# Patient Record
Sex: Male | Born: 1960 | Race: White | Hispanic: No | Marital: Single | Smoking: Current every day smoker
Health system: Southern US, Community
[De-identification: ages and names within clinical notes are randomized; demographics above are authoritative.]

## PROBLEM LIST (undated history)

## (undated) DIAGNOSIS — E119 Type 2 diabetes mellitus without complications: Secondary | ICD-10-CM

## (undated) DIAGNOSIS — I639 Cerebral infarction, unspecified: Secondary | ICD-10-CM

## (undated) DIAGNOSIS — I10 Essential (primary) hypertension: Secondary | ICD-10-CM

## (undated) DIAGNOSIS — IMO0002 Reserved for concepts with insufficient information to code with codable children: Secondary | ICD-10-CM

## (undated) DIAGNOSIS — C801 Malignant (primary) neoplasm, unspecified: Secondary | ICD-10-CM

## (undated) DIAGNOSIS — S2239XA Fracture of one rib, unspecified side, initial encounter for closed fracture: Secondary | ICD-10-CM

## (undated) HISTORY — PX: NO PAST SURGERIES: SHX2092

## (undated) HISTORY — DX: Fracture of one rib, unspecified side, initial encounter for closed fracture: S22.39XA

## (undated) HISTORY — DX: Cerebral infarction, unspecified: I63.9

## (undated) HISTORY — DX: Reserved for concepts with insufficient information to code with codable children: IMO0002

---

## 2004-08-11 ENCOUNTER — Emergency Department (HOSPITAL_COMMUNITY): Admission: EM | Admit: 2004-08-11 | Discharge: 2004-08-11 | Payer: Self-pay

## 2007-07-30 ENCOUNTER — Ambulatory Visit: Payer: Self-pay | Admitting: Family Medicine

## 2007-07-30 ENCOUNTER — Ambulatory Visit: Payer: Self-pay | Admitting: Internal Medicine

## 2007-07-30 ENCOUNTER — Inpatient Hospital Stay (HOSPITAL_COMMUNITY): Admission: AD | Admit: 2007-07-30 | Discharge: 2007-07-31 | Payer: Self-pay | Admitting: Family Medicine

## 2007-07-31 ENCOUNTER — Encounter: Payer: Self-pay | Admitting: Family Medicine

## 2007-07-31 ENCOUNTER — Ambulatory Visit: Payer: Self-pay | Admitting: Vascular Surgery

## 2007-11-02 ENCOUNTER — Encounter: Admission: RE | Admit: 2007-11-02 | Discharge: 2008-01-31 | Payer: Self-pay | Admitting: Family Medicine

## 2008-12-25 ENCOUNTER — Inpatient Hospital Stay (HOSPITAL_COMMUNITY): Admission: EM | Admit: 2008-12-25 | Discharge: 2008-12-27 | Payer: Self-pay | Admitting: Emergency Medicine

## 2008-12-25 ENCOUNTER — Ambulatory Visit: Payer: Self-pay | Admitting: Family Medicine

## 2010-11-27 LAB — CBC
HCT: 27.6 % — ABNORMAL LOW (ref 39.0–52.0)
Hemoglobin: 7.8 g/dL — CL (ref 13.0–17.0)
Hemoglobin: 9 g/dL — ABNORMAL LOW (ref 13.0–17.0)
Hemoglobin: 9.5 g/dL — ABNORMAL LOW (ref 13.0–17.0)
MCHC: 34.4 g/dL (ref 30.0–36.0)
MCHC: 35.1 g/dL (ref 30.0–36.0)
MCHC: 35.7 g/dL (ref 30.0–36.0)
MCV: 88 fL (ref 78.0–100.0)
MCV: 88.6 fL (ref 78.0–100.0)
Platelets: 264 10*3/uL (ref 150–400)
Platelets: 301 10*3/uL (ref 150–400)
RBC: 2.5 MIL/uL — ABNORMAL LOW (ref 4.22–5.81)
RBC: 3.02 MIL/uL — ABNORMAL LOW (ref 4.22–5.81)
RDW: 13.6 % (ref 11.5–15.5)
RDW: 13.7 % (ref 11.5–15.5)
RDW: 13.7 % (ref 11.5–15.5)
WBC: 9.3 10*3/uL (ref 4.0–10.5)
WBC: 9.3 10*3/uL (ref 4.0–10.5)

## 2010-11-27 LAB — BASIC METABOLIC PANEL
BUN: 19 mg/dL (ref 6–23)
CO2: 21 mEq/L (ref 19–32)
CO2: 23 mEq/L (ref 19–32)
CO2: 23 mEq/L (ref 19–32)
Calcium: 8.4 mg/dL (ref 8.4–10.5)
Chloride: 107 mEq/L (ref 96–112)
Chloride: 110 mEq/L (ref 96–112)
Creatinine, Ser: 0.99 mg/dL (ref 0.4–1.5)
Creatinine, Ser: 1.25 mg/dL (ref 0.4–1.5)
Creatinine, Ser: 1.38 mg/dL (ref 0.4–1.5)
GFR calc Af Amer: >60 mL/min (ref >60)
GFR calc Af Amer: >60 mL/min (ref >60)
GFR calc non Af Amer: 55 mL/min — ABNORMAL LOW (ref >60)
GFR calc non Af Amer: >60 mL/min (ref >60)
GFR calc non Af Amer: >60 mL/min (ref >60)
GFR calc non Af Amer: >60 mL/min (ref >60)
GFR calc non Af Amer: >60 mL/min (ref >60)
Glucose, Bld: 182 mg/dL — ABNORMAL HIGH (ref 70–99)
Glucose, Bld: 291 mg/dL — ABNORMAL HIGH (ref 70–99)
Potassium: 4.2 mEq/L (ref 3.5–5.1)
Potassium: 5.8 mEq/L — ABNORMAL HIGH (ref 3.5–5.1)
Sodium: 135 mEq/L (ref 135–145)
Sodium: 136 mEq/L (ref 135–145)
Sodium: 136 mEq/L (ref 135–145)
Sodium: 137 mEq/L (ref 135–145)

## 2010-11-27 LAB — GLUCOSE, CAPILLARY
Glucose-Capillary: 181 mg/dL — ABNORMAL HIGH (ref 70–99)
Glucose-Capillary: 215 mg/dL — ABNORMAL HIGH (ref 70–99)
Glucose-Capillary: 218 mg/dL — ABNORMAL HIGH (ref 70–99)
Glucose-Capillary: 277 mg/dL — ABNORMAL HIGH (ref 70–99)
Glucose-Capillary: 368 mg/dL — ABNORMAL HIGH (ref 70–99)

## 2010-11-27 LAB — DIFFERENTIAL
Basophils Absolute: 0 10*3/uL (ref 0.0–0.1)
Basophils Relative: 0 % (ref 0–1)
Eosinophils Relative: 1 % (ref 0–5)
Lymphocytes Relative: 16 % (ref 12–46)
Lymphs Abs: 2 10*3/uL (ref 0.7–4.0)
Monocytes Absolute: 0.6 10*3/uL (ref 0.1–1.0)
Neutrophils Relative %: 79 % — ABNORMAL HIGH (ref 43–77)

## 2010-11-27 LAB — COMPREHENSIVE METABOLIC PANEL
ALT: 21 U/L (ref 0–53)
Albumin: 3.8 g/dL (ref 3.5–5.2)
Alkaline Phosphatase: 51 U/L (ref 39–117)
BUN: 79 mg/dL — ABNORMAL HIGH (ref 6–23)
CO2: 19 mEq/L (ref 19–32)
Calcium: 9.2 mg/dL (ref 8.4–10.5)
GFR calc Af Amer: >60 mL/min (ref >60)
GFR calc non Af Amer: 50 mL/min — ABNORMAL LOW (ref >60)
Potassium: 7.1 mEq/L (ref 3.5–5.1)
Sodium: 134 mEq/L — ABNORMAL LOW (ref 135–145)

## 2010-11-27 LAB — LIPASE, BLOOD: Lipase: 50 U/L (ref 11–59)

## 2010-11-27 LAB — HEMOCCULT GUIAC POC 1CARD (OFFICE): Fecal Occult Bld: POSITIVE

## 2010-11-27 LAB — CROSSMATCH: Antibody Screen: NEGATIVE

## 2011-01-01 NOTE — H&P (Signed)
NAME:  Dylan Jacobs, Dylan Jacobs               ACCOUNT NO.:  [id number]   MEDICAL RECORD NO.:  [id number]          PATIENT TYPE:  INP   LOCATION:  4735                         FACILITY:  [facility]   PHYSICIAN:  Paula Compton, MD        DATE OF BIRTH:  1961-05-22   DATE OF ADMISSION:  12/25/2008  DATE OF DISCHARGE:                              HISTORY & PHYSICAL   PRIMARY CARE Jamyla Ard:  Dr. Alwyn Ren of [facility].   CHIEF COMPLAINT:  Hyperkalemia and melena.   HISTORY OF PRESENT ILLNESS:  The patient is a 50 year old male with a  history of hypertension and diabetes who presents with after he was  found to have a potassium of 6.6 in his primary care Eliska Hamil's office.  The patient has been having epigastric abdominal pain for 3 days with  melena.  Denies nausea, vomiting, hematochezia, hematemesis, and coffee-  ground emesis.  The patient is to see Dr. Arlyce Dice of [facility] this  upcoming week for evaluation.  The patient's pain improved with Nexium.  Denies chest pain, shortness of breath, palpitations, syncope,  lightheadedness.  Denies NSAID use except for aspirin 81 mg p.o. daily.  Denies heavy alcohol use except for 3-4 glasses of wine on the weekends.   PAST MEDICAL HISTORY:  1. Hypertension.  2. Diabetes.  3. History of TIA 2 to 3 years ago.  4. History of lacunar infarct, left thalamus.   PAST SURGICAL HISTORY:  None.   ALLERGIES:  No known drug allergies.   MEDICATIONS:  1. Lisinopril/hydrochlorothiazide 20/25 mg daily.  2. Metformin 1000 mg p.o. b.i.d.  3. Lopressor 25 mg p.o. b.i.d.  4. Aspirin 81 mg p.o. daily.  5. Glipizide 25 mg p.o. daily.  6. Lipitor 60 mg p.o. daily.   FAMILY HISTORY:  Mother with hypertension.   SOCIAL HISTORY:  The patient lives with 2 brothers and with his parents.  Remote tobacco abuse, quit 3 years ago with an occasional alcohol and no  drugs.   REVIEW OF SYSTEMS:  As per HPI, otherwise, complete review of systems is  negative.   PHYSICAL EXAMINATION:  VITAL SIGNS:  Temperature 97.5, heart rate 103,  blood pressure 112/64, respiratory rate 18, and oxygen saturation 100%.  GENERAL:  No apparent distress, obese male.  HEENT:  Conjunctivae pallor.  No scleral icterus.  Extraocular movements  intact.  Pupils equal, round, reactive to light and accommodation.  CARDIOVASCULAR:  Tachycardic.  No murmurs, rubs, or gallops.  Regular  rhythm.  RESPIRATORY:  Clear to auscultation bilaterally with normal work of  breathing.  ABDOMEN:  Soft, nontender, and nondistended.  Positive bowel sounds.  EXTREMITIES:  Warm and well perfused without edema, clubbing, or  cyanosis.  SKIN:  Positive for pallor.  No jaundice.  NEUROLOGIC:  Nonfocal.  Cranial nerves II through XII grossly intact.   LABORATORY DATA:  Fecal occult blood positive.  Sodium 134, potassium is  10.1, glucose 259, chloride 110, bicarb 19, BUN 79, creatinine 1.49,  bili 0.7, alk phos 51, AST 15, ALT 21, total protein 6.5, albumin 3.8,  and calcium 9.2.  PTT 49, INR 1.0, and PT 13.2.  MCV 89.2, hemoglobin 9,  hematocrit 32.1, white blood cell count 13, platelets 314, and lipase  50.   EKG with peak T waves.   ASSESSMENT AND PLAN:  1. Hyperkalemia:  The patient is asymptomatic except for EKG changes,      possibly secondary to ACE inhibitor, GI bleed, and worsen renal      function.  The patient's creatinine is normal, however, was      elevated from 1.04 on November 21, 2008.  The patient's BUN is also      elevated, which would be consistent with hemolysis.  The patient      was given calcium gluconate, insulin, D50, sodium bicarbonate in      the emergency department secondary to peak T waves in the EKG.  We      will also give the patient Kayexalate x1 and repeat BMET at 1600      today and later this evening as well.  We will repeat Kayexalate as      needed.  We will place the patient on telemetry.  2. Gastrointestinal bleed:  The patient is typed and crossed 1  unit.      He has melena, Hemoccult positive.  He will see Dr. Arlyce Dice this      week.  We will consult [facility].  We will make the patient n.p.o.      after midnight.  We will prescribe Protonix 40 mg IV b.i.d.  Avoid      NSAIDs and heparin.  3. Hypertension:  The patient's BP is stable.  We will not continue      ACE inhibitor or diuretic given acute renal failure and elevated      potassium.  4. Diabetes mellitus:  Sliding scale insulin, continue glipizide.  We      will hold off on metformin for now.  5. Hyperlipidemia:  Continue Lipitor.  6. Fluids, electrolytes, nutrition/gastrointestinal:  Carb-modified      diet, IV fluids.  We will make the patient n.p.o. after midnight.      We will adjust elevated potassium as above.  7. Acute renal failure, likely secondary to ACE inhibitor:  We will      monitor and hydrate.  8. Prophylaxis:  Protonix and SCDs.  9. Disposition:  Pending decreased potassium and GI workup.      Lauro Franklin, MD  Electronically Signed      Paula Compton, MD  Electronically Signed   TCB/MEDQ  D:  12/25/2008  T:  12/26/2008  Job:  [id number]

## 2011-01-01 NOTE — Discharge Summary (Signed)
NAME:  Dylan Jacobs, Dylan Jacobs               ACCOUNT NO.:  [id number]   MEDICAL RECORD NO.:  [id number]          PATIENT TYPE:  INP   LOCATION:  4735                         FACILITY:  [facility]   PHYSICIAN:  Pearlean Brownie, M.D.DATE OF BIRTH:  02-18-1961   DATE OF ADMISSION:  12/25/2008  DATE OF DISCHARGE:  12/27/2008                               DISCHARGE SUMMARY   PRIMARY CARE Tymeshia Awan:  Dr. Alwyn Ren at [facility].   DISCHARGE DIAGNOSES:  1. Hyperkalemia.  2. Duodenal ulcers.   ADDITIONAL DIAGNOSES:  1. Hypertension.  2. Diabetes.  3. History of transient ischemic attack.  4. History of lacunar infarct.   DISCHARGE MEDICATIONS:  1. Nexium 20 mg p.o. b.i.d.  2. He may take his previously prescribed metformin 1000 mg p.o. b.i.d.  3. Lopressor 25 mg p.o. b.i.d.  4. Glipizide 25 mg p.o. daily.  5. Lipitor 60 mg p.o. daily.   The patient is to stop taking lisinopril, hydrochlorothiazide and  aspirin.   REASON FOR HOSPITALIZATION:  The patient is a 50 year old male with  diabetes and hypertension who was sent to the emergency department after  being found to have a potassium of 6.6 with EKG changes at his PCPs  office at [facility].  The patient had been having some epigastric abdominal  pain and melena.   HOSPITAL COURSE BY PROBLEM:  1. Hyperkalemia.  The patient was asymptomatic except for having some      peak T-waves on his EKG.  Our thoughts were that this was probably      a combination of factors including his GI bleed making him a little      bit hypovolemic and some hemolysis from the GI bleed in the setting      of taking an ACE inhibitor.  His creatinine was within normal      limits according to lab values but it is elevated since his      previous creatinine at 1.04.  When we saw him it was 1.49, which      technically is within normal limits but that is a considerable      increase from his baseline of 1.04.  He also had a BUN that was      elevated as well at 79.   At the time of discharge his creatinine is      back to baseline at 0.99 and his BUN is normal as well at 19.  For      his hyperkalemia, the patient was given calcium gluconate, insulin,      D50 and sodium bicarb in the emergency department.  He was also      given p.o. Kayexalate.  He was placed on telemetry.  His potassium      did normalize.  At the time of discharge it is 4.2.  He does need a      followup BMET at the end of this week, and this has been arranged      at [facility].  2. GI bleed.  The patient was typed and crossed for 1 unit and  transfused.  His initial hemoglobin was 9.0, and at the time of      discharge his hemoglobin is 9.9.  The patient was seen by Dr. Herbert Moors of [facility].  An EGD was done that showed two duodenal bulb      ulcers.  Biopsies were taken and for CLOtest.  Per Dr. Luan Moore      verbal report, those ulcers were not bleeding.  There was no active      bleeding and he did not do any treatment for those ulcers.  Dr.      Evette Cristal states that the patient does not need to follow up with him      necessarily, but our team will follow up with his CLOtest results      and forward those on to [facility] when those are available.  The      patient was sent home on Nexium.  Although it was not on his      initial med list, he reports he takes that at home.  3. Acute renal failure.  As mentioned previously, the patient's      creatinine was elevated upon admission.  At the time of discharge      it is back to its baseline.  We stopped his      lisinopril/hydrochlorothiazide combo pill as we thought that      probably contributed both to his renal failure and to his      hyperkalemia.  He was maintained on only his Lopressor.  We will      let P.A.-C. Leotis Shames and Dr. Alwyn Ren decide the risks of putting him      back on an ACE inhibitor.  4. Hypertension.  As mentioned previously, we maintained the patient      only on his beta-blocker during his  hospitalization.  His blood      pressures were generally in the 120s and 130s systolic only on the      metoprolol.  5. Diabetes.  He was placed on sliding scale insulin and his glipizide      was continued but we did not continue his metformin while in the      hospital.  Now that he is being discharged we will put back on his      home regimen with no changes.  6. Hyperlipidemia.  We continued his Lipitor.   FOLLOW UP:  The patient is to follow up with P.A.-C. Leotis Shames at [facility] on Friday, May 14th at 10:45 a.m.  He is to come earlier for  labs.  He is to get a BMET and a CBC particularly to follow up his renal  function, his potassium and his hemoglobin.  Other followup issues  include management of his blood pressure with [place] having held lisinopril,  hydrochlorothiazide, monitoring his potassium and deciding on blood  pressure medicines in light of his recent episode of hyperkalemia.  Other followup issues include following up on his CLOtest from his  biopsy, maintenance of his ulcer disease with a PPI as appropriate.      Asher Muir, MD  Electronically Signed      Pearlean Brownie, M.D.  Electronically Signed    SO/MEDQ  D:  12/27/2008  T:  12/27/2008  Job:  [id number]   cc:   Porfirio Oar, P.A.-C.  Peyton Najjar, MD

## 2011-01-01 NOTE — Consult Note (Signed)
NAME:  Dylan Jacobs, Dylan Jacobs               ACCOUNT NO.:  [id number]   MEDICAL RECORD NO.:  [id number]          PATIENT TYPE:  INP   LOCATION:  4735                         FACILITY:  [facility]   PHYSICIAN:  Graylin Shiver, M.D.   DATE OF BIRTH:  1961-07-10   DATE OF CONSULTATION:  12/26/2008  DATE OF DISCHARGE:                                 CONSULTATION   REASON FOR CONSULTATION:  The patient is a 50 year old male who 4 days  ago started to experience melena.  He saw his primary doctor who checked  some blood work, then sent him to the hospital for admission.  The  patient did not vomit any blood.  He states he did have some epigastric  discomfort a couple of days ago.  He states that today, his stools are  back to normal.  There are brown color.  When he came in to the  hospital, his stool was occult positive for blood.  His initial  hemoglobin yesterday was 9 at 3:30 a.m.  Today, his hemoglobin had  dropped to 7.8.  He has been transfused a unit of blood.  He feels fine  at this time.   PAST MEDICAL HISTORY:  1. Hypertension.  2. Diabetes.  3. History of TIA.  4. History of lacunar infarct.   MEDICATIONS:  Lisinopril/hydrochlorothiazide, metformin, Lopressor,  aspirin, glyburide, Lipitor.   SOCIAL HISTORY:  Drinks occasional wine, does not smoke.   PHYSICAL EXAMINATION:  GENERAL:  He is alert and oriented.  He is in no  distress, nonicteric.  HEART:  Regular rhythm.  No murmurs.  LUNGS:  Clear.  ABDOMEN:  Soft, nontender.  No hepatosplenomegaly.   IMPRESSION:  1. Melena.  2. Anemia secondary to blood loss.   PLAN:  I will schedule the patient for an EGD tomorrow to further  evaluate.  He will remain on proton pump inhibitors.  Blood will be  available and transfused as necessary.           ______________________________  Graylin Shiver, M.D.     SFG/MEDQ  D:  12/26/2008  T:  12/27/2008  Job:  [id number]   cc:   Paula Compton, MD

## 2011-01-01 NOTE — Op Note (Signed)
NAME:  Dylan Jacobs, [name]               ACCOUNT NO.:  [id number]   MEDICAL RECORD NO.:  [id number]          PATIENT TYPE:  INP   LOCATION:                               FACILITY:  [facility]   PHYSICIAN:  Graylin Shiver, M.D.   DATE OF BIRTH:  Oct 03, 1960   DATE OF PROCEDURE:  12/27/2008  DATE OF DISCHARGE:                               OPERATIVE REPORT   Esophagogastroduodenoscopy with biopsy for CLO test.   INDICATIONS FOR PROCEDURE:  Melena, anemia.   Informed consent was obtained after explanation of the risks of  bleeding, infection, and perforation.   PREMEDICATIONS:  1. Fentanyl 100 mcg IV.  2. Versed 10 mg IV.  3. Benadryl 25 mg IV.   PROCEDURE:  With the patient in the left lateral decubitus position, the  Pentax gastroscope was inserted into the oropharynx and passed into the  esophagus.  It was advanced down the esophagus, then into the stomach  and into the duodenum.  The second portion of the duodenum looked  normal.  In the bulb of the duodenum, there were 2 duodenal bulb ulcers,  one was approximately 7 mm in size, the other 1 cm in size.  There was  no evidence of a visible vessel or active bleeding.  The stomach looked  normal.  A biopsy for CLO test was obtained.  No lesions were seen in  the fundus or cardia of the stomach.  The esophagus looked normal.  He  tolerated the procedure well without complications.   IMPRESSION:  Two duodenal bulb ulcers.   PLAN:  We would recommend treatment with proton pump inhibitors and  check CLO test for evidence of Helicobacter pylori.           ______________________________  Graylin Shiver, M.D.     SFG/MEDQ  D:  12/27/2008  T:  12/28/2008  Job:  [id number]   cc:   Paula Compton, MD

## 2011-01-01 NOTE — H&P (Signed)
NAME:  Jacobs, Dylan               ACCOUNT NO.:  [id number]   MEDICAL RECORD NO.:  [id number]          PATIENT TYPE:  INP   LOCATION:  3731                         FACILITY:  [facility]   PHYSICIAN:  Angeline Slim, M.D.  DATE OF BIRTH:  1961-02-20   DATE OF ADMISSION:  07/30/2007  DATE OF DISCHARGE:                              HISTORY & PHYSICAL   PRIMARY CARE PHYSICIAN:  [place].   ADMISSION DIAGNOSES:  1. Possible transient ischemic attack.  2. Hypertension.   HISTORY OF PRESENT ILLNESS:  A 51 year old smoker presents after having  experienced 3 episodes of right facial numbness including mouth, right  arm and right leg which lasted approximately 5 minutes and were  separated by approximately 10 minutes. No episodes for the last few  hours. Denies changes in vision, chest pain, shortness of breath or  nausea. No previous episodes in the past. He denies dysphagia,  dysarthria, confusion or headache. The patient reports he had a cold  about 2 weeks ago followed by sinus pressure for the last 4 days which  he has been using over-the-counter Sudafed and nasal saline for. He has  been told in the past that he has borderline hypertension and  borderline sugar but he has not been on any medications and he does not  have a regular doctor.   PAST MEDICAL HISTORY:  Elevated blood pressure first told when he was  age 50. He also broke his arm when he was age 42. No other significant  medical history.   PAST SURGICAL HISTORY:  None.   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 He smokes 1-1/2 packs of cigarettes per day for the last 25  years. He denies alcohol or drug use. He lives with his parents and his  brother.   FAMILY HISTORY:  Significant for hypertension in his mother. His father  is in his 6s and is alive and well without any cardiac or hypertensive  history. He has no known drug allergies. He is not using any medicines  other than  over-the-counter Sudafed and nasal saline.   REVIEW OF SYSTEMS:  Denies recent malaise, fatigue, weight change.  Denies trauma, nausea, vomiting, visual changes. He does report sinus  pressure for the last 4 days. He denies shortness of breath, cough,  hemoptysis. He denies chest pain, paroxysmal nocturnal dyspnea and  dyspnea on exertion. He denies constipation, diarrhea and anorexia.  NEURO:  As per HPI. PSYCHE:  He denies depression and anxiety.   PHYSICAL EXAMINATION:  VITAL SIGNS:  Blood pressure 187/98, pulse 105,  pulse ox 96% on room air. Respiratory rate 16.  SKIN:  Reveals no rash.  HEENT:  Pupils equal round and reactive to light. Extraocular movements  intact. No sinus tenderness to palpation. Nasal turbinates are nonboggy,  nonedematous. Mouth is moist with 2 missing front teeth. No other  lesions.  HEART:  The patient has positive S1, S2. She is tachycardic at 105.  Possible S4 noted on exam. No radiation, no carotid bruits, no jugular  venous distention.  LUNGS:  Clear to auscultation  bilaterally without rales, rhonchi or  wheezing.  ABDOMEN:  Soft, nontender, nondistended, positive bowel sounds x4. No  hepatosplenomegaly.  MUSCULOSKELETAL:  No atrophy noted, no weakness. Full range of motion in  all joints.  NEUROLOGIC:  Cranial nerves II-XII intact. Sensation intact throughout.  Romberg exam normal. Rapid alternating hand movements normal. Gait  normal. Reflexes, 2+ patella reflex, 2+ brachial radialis.   ASSESSMENT/PLAN:  A 50 year old smoker with possible transient ischemic  attack. Stop Sudafed. EKG and telemetry now. Start aspirin 81 mg once a  day. Stat head CT. Consider MRI/MRA in the future. Check a 2-D echo.  Check carotid Dopplers. Check a fasting lipid panel and blood glucose  level. Also check urinary drug screen and cardiac enzymes. Plan to  control his blood pressure slowly. Should consider as an outpatient  possibly hydrochlorothiazide along with an  ACE inhibitor since they have  been shown to decrease risk for stroke. Will also start on an  antihypertensive if his systolic goes above 200 and his diastolic goes  above 120. Otherwise will continue to monitor patient.     ______________________________  Zannie Cove, MD    ______________________________  Angeline Slim, M.D.    IN/MEDQ  D:  07/30/2007  T:  07/31/2007  Job:  [id number]

## 2011-01-04 NOTE — Discharge Summary (Signed)
NAME:  Dylan Jacobs, Dylan Jacobs               ACCOUNT NO.:  [id number]   MEDICAL RECORD NO.:  [id number]          PATIENT TYPE:  INP   LOCATION:  3731                         FACILITY:  [facility]   PHYSICIAN:  Leighton Roach McDiarmid, M.D.DATE OF BIRTH:  [birth year]   DATE OF ADMISSION:  07/30/2007  DATE OF DISCHARGE:  07/31/2007                               DISCHARGE SUMMARY   DISCHARGE DIAGNOSES:  1. Left thalamus lacunar infarct.  2. Hypertension.  3. Hyperlipidemia.  4. Diabetes mellitus type 2.  5. Tobacco abuse.   DISCHARGE MEDICATIONS:  1. Aspirin 81 mg p.o. daily.  2. Lipitor 80 mg p.o. every night.  3. Metformin 500 mg p.o. b.i.d.  4. Glipizide 2.5 mg 1 p.o. daily.   PROCEDURES:  1. Noncontrast head CT was positive for a left thalamus lacunar      infarct.  2. Carotid Dopplers showed no left ICA stenosis and right ICA has 40-      60% stenosis. Bilateral vertebral artery flow was antegrade.  3. Two-D echocardiogram was pending at the time of this discharge      summary.   CONSULTATIONS:  None.   SIGNIFICANT LABORATORY DATA:  White blood cell count 12.4, hemoglobin  16, hematocrit 46, platelets 339. Sodium 138, potassium 4, chloride 100,  bicarb 28, BUN 9, creatinine 0.76. Fasting glucose 211. Total  cholesterol was 236, LDL cholesterol was 131, triglycerides was 356, HDL  was 34.   HOSPITAL COURSE:  Dylan Jacobs is a 50 year old African-American smoker who  presented after experiencing 3 episodes of right facial numbness  including mouth, right arm and right leg which lasted approximately 5  minutes and were separated by approximately 10 minutes. Each episode  resolved completely. He does not have a regular physician. Has been told  in the past that he has borderline hypertension and borderline sugar but  had not been on any medications for these. He denied dysphagia,  dysarthria, confusion or headache. Denies changes in vision, chest pain,  shortness of breath, nausea, vomiting,  fever or chills.   1. Right-sided episodic numbness. Noncontrast head CT was obtained      which showed a left thalamus lacunar infarct. His symptoms had      totally resolved at the time of discharge, and he has had no      further episode. He was informed of his diagnosis and the      importance of controlling risk factors for further CVA including      hypertension, hyperlipidemia, and diabetes mellitus. He had carotid      Dopplers that showed noncritical stenosis. Two-D echocardiogram was      pending at the time of this discharge summary. He was started on 81      mg of aspirin daily, Lipitor 80 mg daily, and will need follow up      with a primary care physician to initiate blood pressure      medication. We recommend hydrochlorothiazide and an ACE inhibitor      as both of these have evidence of cerebrovascular disease.  He was  also newly diagnosed with diabetes and was started on metformin and      glipizide for this.  2. New onset type 2 diabetes mellitus. The patient was informed of his      new diagnosis, received inpatient diabetic education and was      referred for outpatient diabetes education. Was prescribed a meter      and instructed on how to check his blood sugar and started on      glipizide and metformin. He is to follow up with primary care      physician of his choice regarding his new diabetes.  3. Hypertension. The patient had elevated blood pressures throughout      the hospital course in the 170s to 80s/90s. We did not initially      start a blood pressure medication during this hospital stay, in      order to avoid a precipitous drop in blood pressure that could lead      to further CVA. He will need to be started on blood pressure      medication in the near future by primary care physician of his      choice, and we recommended ACE inhibitor and hydrochlorothiazide.  4. Tobacco abuse. The patient received smoking cessation consultation      and will  use nicotine patches to assist him with quitting smoking.  5. Hyperlipidemia. The patient was started on Lipitor 80 mg at bedtime      and was instructed to report to his primary care physician if he      experienced myalgias.   FOLLOW UP:  The patient did not have a primary care physician at the  time of admission and was undecided on who he would follow up with. He  does have insurance. Therefore, was planning on researching who was  covered under his plan. He does understand the importance of follow up,  and we recommended follow up within one week of discharge. He was also  given the [facility] information as we would be more than  happy to take him as a patient.      Levander Campion, M.D.  Electronically Signed      Leighton Roach McDiarmid, M.D.  Electronically Signed    JH/MEDQ  D:  08/27/2007  T:  08/27/2007  Job:  [id number]

## 2011-05-27 LAB — LIPID PANEL
LDL Cholesterol: 131 — ABNORMAL HIGH
Triglycerides: 356 — ABNORMAL HIGH

## 2011-05-27 LAB — BASIC METABOLIC PANEL
CO2: 28
Calcium: 9.2
Chloride: 98
Creatinine, Ser: 0.7
GFR calc Af Amer: >60
Glucose, Bld: 211 — ABNORMAL HIGH
Potassium: 3.9
Sodium: 136
Sodium: 138

## 2011-05-27 LAB — CARDIAC PANEL(CRET KIN+CKTOT+MB+TROPI)
CK, MB: 0.9
CK, MB: 0.9
CK, MB: 1.2
Relative Index: INVALID
Relative Index: INVALID
Total CK: 45
Total CK: 52
Total CK: 53
Troponin I: 0.03

## 2011-05-27 LAB — HEMOGLOBIN A1C
Hgb A1c MFr Bld: 9.8 — ABNORMAL HIGH
Mean Plasma Glucose: 272

## 2011-05-27 LAB — CBC
Hemoglobin: 16
MCHC: 34.7
MCV: 84.9
Platelets: 342
RBC: 5.39
WBC: 16.2 — ABNORMAL HIGH

## 2011-05-27 LAB — HEPATIC FUNCTION PANEL
AST: 20
Albumin: 3.8
Total Bilirubin: 1.5 — ABNORMAL HIGH
Total Protein: 7.3

## 2011-05-27 LAB — DRUGS OF ABUSE SCREEN W/O ALC, ROUTINE URINE
Amphetamine Screen, Ur: NEGATIVE
Barbiturate Quant, Ur: NEGATIVE
Benzodiazepines.: NEGATIVE
Creatinine,U: 105.5
Marijuana Metabolite: NEGATIVE
Methadone: NEGATIVE
Opiate Screen, Urine: NEGATIVE

## 2011-05-27 LAB — TSH: TSH: 2.975

## 2011-10-15 ENCOUNTER — Other Ambulatory Visit: Payer: Self-pay

## 2011-10-15 MED ORDER — HYDROCHLOROTHIAZIDE 25 MG PO TABS: 25.0000 mg | ORAL_TABLET | Freq: Every day | ORAL | Status: DC

## 2011-11-20 ENCOUNTER — Other Ambulatory Visit: Payer: Self-pay | Admitting: Physician Assistant

## 2011-11-20 MED ORDER — HYDROCHLOROTHIAZIDE 25 MG PO TABS: 25.0000 mg | ORAL_TABLET | Freq: Every day | ORAL | Status: DC

## 2011-12-29 ENCOUNTER — Ambulatory Visit (INDEPENDENT_AMBULATORY_CARE_PROVIDER_SITE_OTHER): Payer: Managed Care, Other (non HMO) | Admitting: Family Medicine

## 2011-12-29 VITALS — BP 201/108 | HR 111 | Temp 97.8°F | Resp 20 | Ht 67.75 in | Wt 238.0 lb

## 2011-12-29 DIAGNOSIS — E669 Obesity, unspecified: Secondary | ICD-10-CM

## 2011-12-29 DIAGNOSIS — I10 Essential (primary) hypertension: Secondary | ICD-10-CM | POA: Insufficient documentation

## 2011-12-29 DIAGNOSIS — F4321 Adjustment disorder with depressed mood: Secondary | ICD-10-CM

## 2011-12-29 DIAGNOSIS — E119 Type 2 diabetes mellitus without complications: Secondary | ICD-10-CM | POA: Insufficient documentation

## 2011-12-29 DIAGNOSIS — E785 Hyperlipidemia, unspecified: Secondary | ICD-10-CM

## 2011-12-29 LAB — COMPREHENSIVE METABOLIC PANEL
ALT: 18 U/L (ref 0–53)
Albumin: 4.4 g/dL (ref 3.5–5.2)
CO2: 27 mEq/L (ref 19–32)
Glucose, Bld: 334 mg/dL — ABNORMAL HIGH (ref 70–99)
Potassium: 4.9 mEq/L (ref 3.5–5.3)
Sodium: 135 mEq/L (ref 135–145)
Total Bilirubin: 0.7 mg/dL (ref 0.3–1.2)
Total Protein: 6.9 g/dL (ref 6.0–8.3)

## 2011-12-29 LAB — POCT GLYCOSYLATED HEMOGLOBIN (HGB A1C): Hemoglobin A1C: 10.4

## 2011-12-29 LAB — LIPID PANEL: Cholesterol: 157 mg/dL (ref 0–200)

## 2011-12-29 MED ORDER — METOPROLOL TARTRATE 50 MG PO TABS: 50.0000 mg | ORAL_TABLET | Freq: Two times a day (BID) | ORAL | Status: DC

## 2011-12-29 MED ORDER — HYDROCHLOROTHIAZIDE 25 MG PO TABS: 25.0000 mg | ORAL_TABLET | Freq: Every day | ORAL | Status: DC

## 2011-12-29 MED ORDER — ATORVASTATIN CALCIUM 40 MG PO TABS: 40.0000 mg | ORAL_TABLET | Freq: Every day | ORAL | Status: DC

## 2011-12-29 MED ORDER — METFORMIN HCL 1000 MG PO TABS: 1000.0000 mg | ORAL_TABLET | Freq: Once | ORAL | Status: DC

## 2011-12-29 NOTE — Progress Notes (Signed)
Subjective: Patient has had a rough year. His father died last year in he has lost 2 brothers in recent months. He has been very tight on money and his head to make some choices, did not get his blood pressure medication very filled because he couldn't afford coming here. He has been taking his diabetic medicines-. His sugars run from 130 to 180 or so. If he goes out it goes way up. He gets a lot of exercise working at [employer].  Review of systems is unremarkable. HEENT normal. He is due an eye exam sometime soon. His respiratory cardiovascular GI and GU are normal.  Objective: Overweight white male in no acute distress. Wears glasses. TMs normal. Throat clear. Neck supple without nodes. Chest clear to auscultation. Heart regular without murmurs. Abdomen soft without mass or tenderness. No ankle edema.  Assessment: Diabetes Hypertension Hyperlipidemia Grief Obesity  Plan: Stressed the need for him to lose weight. 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 He needs to watch his dietary intake. Advised when he gets back on the blood pressure medicine he should recheck his lipid pressure to make sure it has come down. If it persistently high we might need to make changes and he should let me know. Results for orders placed in visit on 12/29/11  POCT GLYCOSYLATED HEMOGLOBIN (HGB A1C)      Component Value Range   Hemoglobin A1C 10.4

## 2011-12-29 NOTE — Patient Instructions (Signed)
Be sure to recheck your blood pressure in a week or so. D continues to run high.

## 2011-12-30 ENCOUNTER — Encounter: Payer: Self-pay | Admitting: Family Medicine

## 2012-01-29 ENCOUNTER — Telehealth: Payer: Self-pay

## 2012-01-29 NOTE — Telephone Encounter (Signed)
PT HAVE CALLED AGAIN REGARDING HIS MEDICINES AND HOW THEY ARE MESSED UP PLEASE CALL [phone]    [facility] TEETER ON [facility]

## 2012-01-29 NOTE — Telephone Encounter (Signed)
Spoke with patient and let him know that he needs to come back in for follow up before refill on hctz can be filled.  Stated to him that we need to check him to see if meds are working or needs to be adjusted.

## 2012-03-02 ENCOUNTER — Ambulatory Visit (INDEPENDENT_AMBULATORY_CARE_PROVIDER_SITE_OTHER): Payer: Managed Care, Other (non HMO) | Admitting: Family Medicine

## 2012-03-02 VITALS — BP 162/98 | HR 92 | Temp 97.7°F | Resp 18 | Ht 67.5 in | Wt 235.0 lb

## 2012-03-02 DIAGNOSIS — I10 Essential (primary) hypertension: Secondary | ICD-10-CM

## 2012-03-02 DIAGNOSIS — IMO0001 Reserved for inherently not codable concepts without codable children: Secondary | ICD-10-CM

## 2012-03-02 LAB — GLUCOSE, POCT (MANUAL RESULT ENTRY): POC Glucose: 252 mg/dl — AB (ref 70–99)

## 2012-03-02 MED ORDER — HYDROCHLOROTHIAZIDE 25 MG PO TABS: 25.0000 mg | ORAL_TABLET | Freq: Every day | ORAL | Status: DC

## 2012-03-02 MED ORDER — GLIMEPIRIDE 4 MG PO TABS: ORAL_TABLET | ORAL | Status: DC

## 2012-03-02 NOTE — Patient Instructions (Addendum)
Return late September for followup.

## 2012-03-02 NOTE — Progress Notes (Signed)
Subjective: Patient is here for a followup visit from last time when his blood pressure was so high. He feels like he's handling life a little bit better now. He still getting over the death of his brothers. He continues to work at [employer]. He is taking his medicine faithfully. He is trying to eat better.  Objective: No acute distress. Alert and oriented. Chest clear. Heart regular without murmurs. Blood pressure on repeat was 142/86.  Assessment: Hypertension, improving control Diabetes  Plan: Check sugar and A1c and decide on medications   Results for orders placed in visit on 03/02/12  GLUCOSE, POCT (MANUAL RESULT ENTRY)      Component Value Range   POC Glucose 252 (*) 70 - 99 mg/dl  POCT GLYCOSYLATED HEMOGLOBIN (HGB A1C)      Component Value Range   Hemoglobin A1C 9.8     We will add Amaryl ( glimepiride} 4 mg, to take one half tablet daily for 2 weeks and then increase to 4 mg daily. He is to return in 2 months to get this rechecked.

## 2012-03-02 NOTE — Addendum Note (Signed)
Addended by: Justus Duerr H on: 03/02/2012 05:05 PM   Modules accepted: Orders

## 2012-03-04 ENCOUNTER — Other Ambulatory Visit: Payer: Self-pay | Admitting: Family Medicine

## 2012-07-01 ENCOUNTER — Other Ambulatory Visit: Payer: Self-pay | Admitting: Physician Assistant

## 2012-08-31 ENCOUNTER — Other Ambulatory Visit: Payer: Self-pay | Admitting: Physician Assistant

## 2012-12-04 ENCOUNTER — Other Ambulatory Visit: Payer: Self-pay | Admitting: Family Medicine

## 2013-02-05 ENCOUNTER — Other Ambulatory Visit: Payer: Self-pay | Admitting: Family Medicine

## 2013-02-05 NOTE — Telephone Encounter (Signed)
Needs OV, labs - no further refills

## 2013-03-07 ENCOUNTER — Other Ambulatory Visit: Payer: Self-pay | Admitting: Family Medicine

## 2013-06-16 ENCOUNTER — Ambulatory Visit (INDEPENDENT_AMBULATORY_CARE_PROVIDER_SITE_OTHER): Payer: Managed Care, Other (non HMO) | Admitting: Family Medicine

## 2013-06-16 VITALS — BP 152/90 | HR 94 | Temp 97.8°F | Resp 18 | Ht 67.5 in | Wt 230.0 lb

## 2013-06-16 DIAGNOSIS — I10 Essential (primary) hypertension: Secondary | ICD-10-CM

## 2013-06-16 DIAGNOSIS — R12 Heartburn: Secondary | ICD-10-CM

## 2013-06-16 DIAGNOSIS — E785 Hyperlipidemia, unspecified: Secondary | ICD-10-CM

## 2013-06-16 DIAGNOSIS — E119 Type 2 diabetes mellitus without complications: Secondary | ICD-10-CM

## 2013-06-16 DIAGNOSIS — L858 Other specified epidermal thickening: Secondary | ICD-10-CM

## 2013-06-16 DIAGNOSIS — K219 Gastro-esophageal reflux disease without esophagitis: Secondary | ICD-10-CM

## 2013-06-16 DIAGNOSIS — L988 Other specified disorders of the skin and subcutaneous tissue: Secondary | ICD-10-CM

## 2013-06-16 DIAGNOSIS — IMO0001 Reserved for inherently not codable concepts without codable children: Secondary | ICD-10-CM

## 2013-06-16 LAB — LIPID PANEL
HDL: 41 mg/dL (ref >39)
LDL Cholesterol: 58 mg/dL (ref 0–99)
Triglycerides: 348 mg/dL — ABNORMAL HIGH (ref <150)
VLDL: 70 mg/dL — ABNORMAL HIGH (ref 0–40)

## 2013-06-16 LAB — POCT CBC
Granulocyte percent: 73 %G (ref 37–80)
HCT, POC: 48.6 % (ref 43.5–53.7)
Hemoglobin: 15.3 g/dL (ref 14.1–18.1)
Lymph, poc: 2.3 (ref 0.6–3.4)
MCH, POC: 29.8 pg (ref 27–31.2)
MCHC: 31.5 g/dL — AB (ref 31.8–35.4)
MCV: 94.6 fL (ref 80–97)
MID (cbc): 0.6 (ref 0–0.9)
Platelet Count, POC: 279 10*3/uL (ref 142–424)
RBC: 5.14 M/uL (ref 4.69–6.13)
WBC: 10.9 10*3/uL — AB (ref 4.6–10.2)

## 2013-06-16 LAB — COMPREHENSIVE METABOLIC PANEL
Alkaline Phosphatase: 89 U/L (ref 39–117)
BUN: 23 mg/dL (ref 6–23)
Glucose, Bld: 288 mg/dL — ABNORMAL HIGH (ref 70–99)
Sodium: 138 mEq/L (ref 135–145)
Total Bilirubin: 0.7 mg/dL (ref 0.3–1.2)

## 2013-06-16 MED ORDER — OMEPRAZOLE 40 MG PO CPDR: DELAYED_RELEASE_CAPSULE | ORAL | Status: DC

## 2013-06-16 MED ORDER — GLIMEPIRIDE 4 MG PO TABS: ORAL_TABLET | ORAL | Status: DC

## 2013-06-16 MED ORDER — ATORVASTATIN CALCIUM 40 MG PO TABS: 40.0000 mg | ORAL_TABLET | Freq: Every day | ORAL | Status: DC

## 2013-06-16 MED ORDER — HYDROCHLOROTHIAZIDE 25 MG PO TABS: 25.0000 mg | ORAL_TABLET | Freq: Every day | ORAL | Status: DC

## 2013-06-16 MED ORDER — METFORMIN HCL 1000 MG PO TABS: 1000.0000 mg | ORAL_TABLET | Freq: Two times a day (BID) | ORAL | Status: DC

## 2013-06-16 MED ORDER — METOPROLOL TARTRATE 50 MG PO TABS: 50.0000 mg | ORAL_TABLET | Freq: Two times a day (BID) | ORAL | Status: DC

## 2013-06-16 NOTE — Progress Notes (Signed)
Subjective: 52 year old man who is here for regular visit and refill of his medications. He has 2 other major concerns. He's been having a lot of reflux for the last 2 weeks. He just started some OTC Nexium a day to ago. He has been under stress at work. They have cut his hours. Money is tight. He has not wanted to come in for an office visit, unable to afford the co-pay. Therefore he has not taken his medications for the last 2 months. He also has a skin lesion which is growing on the last 6 months her right forearm. There is a small lesion proximal to that for me to look at also.  Objective: Alert oriented male in no acute distress. Overweight. Throat clear. Poor dentition. TMs normal. Neck supple without nodes. Chest clear. Heart regular without murmurs. And soft without masses. Mild epigastric tenderness. Extremities without edema. Right forearm has a case for about 5 mm in diameter. His right distal upper arm and a small seborrheic keratosis.  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 Check labs. With the heartburn I did decide to go ahead and do an EKG since he has hypertension and diabetes.  Procedure note: The skin was numbed up with 1% lidocaine. Shave biopsy was performed specimen sent to pathology. The base was cauterized and curetted and recauterized. His tolerated procedure well.  Incidentally I went ahead and cauterized a little seborrheic keratosis for him. Did not bill that because he was concerned insurance wouldn't pay for it.  EKG Left axis  Results for orders placed in visit on 06/16/13  POCT GLYCOSYLATED HEMOGLOBIN (HGB A1C)      Result Value Range   Hemoglobin A1C 98    POCT CBC      Result Value Range    WBC 10.9 (*) 4.6 - 10.2 K/uL   Lymph, poc 2.3  0.6 - 3.4   POC LYMPH PERCENT 21.3  10 - 50 %L   MID (cbc) 0.6  0 - 0.9   POC MID % 5.7  0 - 12 %M   POC Granulocyte 8.0 (*) 2 - 6.9   Granulocyte percent 73.0  37 - 80 %G   RBC 5.14  4.69 - 6.13 M/uL   Hemoglobin 15.3  14.1 - 18.1 g/dL   HCT, POC 40.9  81.1 - 53.7 %   MCV 94.6  80 - 97 fL   MCH, POC 29.8  27 - 31.2 pg   MCHC 31.5 (*) 31.8 - 35.4 g/dL   RDW, POC 91.4     Platelet Count, POC 279  142 - 424 K/uL   MPV 8.5  0 - 99.8 fL

## 2013-06-16 NOTE — Patient Instructions (Signed)
Take the omeprazole 1 daily for a heartburn. If abruptly worse at anytime, or if not improving over the next few weeks, return.  Continue your other medications as you were on them in the past.  Return in 4-6 months for recheck.

## 2013-06-17 ENCOUNTER — Encounter: Payer: Self-pay | Admitting: Family Medicine

## 2014-05-28 ENCOUNTER — Encounter (HOSPITAL_COMMUNITY): Payer: Self-pay | Admitting: Emergency Medicine

## 2014-05-28 ENCOUNTER — Emergency Department (HOSPITAL_COMMUNITY): Payer: Managed Care, Other (non HMO)

## 2014-05-28 ENCOUNTER — Emergency Department (HOSPITAL_COMMUNITY)
Admission: EM | Admit: 2014-05-28 | Discharge: 2014-05-29 | Disposition: A | Discharge status: Home / Self Care | Payer: Managed Care, Other (non HMO) | Attending: Emergency Medicine | Admitting: Emergency Medicine

## 2014-05-28 DIAGNOSIS — Z79899 Other long term (current) drug therapy: Secondary | ICD-10-CM | POA: Insufficient documentation

## 2014-05-28 DIAGNOSIS — S2242XA Multiple fractures of ribs, left side, initial encounter for closed fracture: Secondary | ICD-10-CM | POA: Diagnosis not present

## 2014-05-28 DIAGNOSIS — Z87891 Personal history of nicotine dependence: Secondary | ICD-10-CM | POA: Diagnosis not present

## 2014-05-28 DIAGNOSIS — S299XXA Unspecified injury of thorax, initial encounter: Secondary | ICD-10-CM | POA: Diagnosis present

## 2014-05-28 DIAGNOSIS — Y9389 Activity, other specified: Secondary | ICD-10-CM | POA: Insufficient documentation

## 2014-05-28 DIAGNOSIS — Y9289 Other specified places as the place of occurrence of the external cause: Secondary | ICD-10-CM | POA: Insufficient documentation

## 2014-05-28 DIAGNOSIS — Z8719 Personal history of other diseases of the digestive system: Secondary | ICD-10-CM | POA: Insufficient documentation

## 2014-05-28 DIAGNOSIS — W1830XA Fall on same level, unspecified, initial encounter: Secondary | ICD-10-CM | POA: Insufficient documentation

## 2014-05-28 DIAGNOSIS — Z8673 Personal history of transient ischemic attack (TIA), and cerebral infarction without residual deficits: Secondary | ICD-10-CM | POA: Diagnosis not present

## 2014-05-28 MED ORDER — KETOROLAC TROMETHAMINE 60 MG/2ML IM SOLN: 60.0000 mg | Freq: Once | INTRAMUSCULAR | Status: DC

## 2014-05-28 MED ORDER — OXYCODONE-ACETAMINOPHEN 5-325 MG PO TABS
1.0000 | ORAL_TABLET | Freq: Once | ORAL | Status: AC
  Administered 2014-05-28: 1 via ORAL
  Filled 2014-05-28: qty 1

## 2014-05-28 NOTE — ED Notes (Signed)
Pt arrived to the ED with a complaint of a fall.  Pt caught his foot on the concrete and fell on his left side.  Pt is complaining of left sided rib pain.  Pt states pain begins on the left chest area and extends around to his back.  Pt had the fall yesterday and ibuprofen has not relieved the pain.

## 2014-05-29 MED ORDER — KETOROLAC TROMETHAMINE 60 MG/2ML IM SOLN
60.0000 mg | Freq: Once | INTRAMUSCULAR | Status: AC
  Administered 2014-05-29: 60 mg via INTRAMUSCULAR
  Filled 2014-05-29: qty 2

## 2014-05-29 MED ORDER — IBUPROFEN 800 MG PO TABS: 800.0000 mg | ORAL_TABLET | Freq: Three times a day (TID) | ORAL | Status: DC | PRN

## 2014-05-29 MED ORDER — OXYCODONE-ACETAMINOPHEN 5-325 MG PO TABS: 1.0000 | ORAL_TABLET | Freq: Four times a day (QID) | ORAL | Status: DC | PRN

## 2014-05-29 NOTE — Discharge Instructions (Signed)
Return here for any worsening in your condition.  Use ice and heat on her ribs.  Followup with your primary care Dr. use the incentive spirometer to prevent pneumonia

## 2014-05-29 NOTE — ED Provider Notes (Signed)
CSN: [id number]     Arrival date & time 05/28/14  2139 History   First MD Initiated Contact with Patient 05/28/14 2209     Chief Complaint  Patient presents with  . Fall     (Consider location/radiation/quality/duration/timing/severity/associated sxs/prior Treatment) HPI Patient presents to the emergency department following a fall that occurred yesterday.  The patient, states, that he got his feet caught rub, and fell into a concrete area that was raised.  He states that he landed on his left ribs.  Patient has increased pain with palpation and deep breathing.  The patient denies nausea, vomiting, headache, blurred vision, weakness, dizziness, cough, fever, or syncope.  The patient denies any head injury, or loss consciousness.  The patient, states, that he did not take any medications prior to arrival Past Medical History  Diagnosis Date  . Ulcer   . Stroke    History reviewed. No pertinent past surgical history. Family History  Problem Relation Age of Onset  . Prostate cancer Father   . Cancer Brother   . ALS Brother    History  Substance Use Topics  . Smoking status: Former Smoker -- 2.00 packs/day for 30 years    Types: Cigarettes    Quit date: 12/31/2009  . Smokeless tobacco: Not on file  . Alcohol Use: Yes    Review of Systems  All other systems negative except as documented in the HPI. All pertinent positives and negatives as reviewed in the HPI.  Allergies  Review of patient's allergies indicates no known allergies.  Home Medications   Prior to Admission medications   Medication Sig Start Date End Date Taking? Authorizing Provider  atorvastatin (LIPITOR) 40 MG tablet Take 1 tablet (40 mg total) by mouth daily. 06/16/13  Yes [name], MD  glimepiride (AMARYL) 4 MG tablet Take 4 mg by mouth daily.   Yes Historical Provider, MD  hydrochlorothiazide (HYDRODIURIL) 25 MG tablet Take 1 tablet (25 mg total) by mouth daily. 06/16/13  Yes [name], MD    metFORMIN (GLUCOPHAGE) 1000 MG tablet Take 1 tablet (1,000 mg total) by mouth 2 (two) times daily with a meal. 06/16/13  Yes [name], MD  metoprolol (LOPRESSOR) 50 MG tablet Take 50 mg by mouth 2 (two) times daily.   Yes Historical Provider, MD   BP 175/83  Pulse 94  Temp(Src) 98.6 F (37 C) (Oral)  Resp 18  SpO2 96% Physical Exam  Nursing note and vitals reviewed. Constitutional: He is oriented to person, place, and time. He appears well-developed and well-nourished. No distress.  HENT:  Head: Normocephalic and atraumatic.  Mouth/Throat: Oropharynx is clear and moist.  Eyes: Pupils are equal, round, and reactive to light.  Neck: Normal range of motion. Neck supple.  Cardiovascular: Normal rate, regular rhythm and normal heart sounds.  Exam reveals no gallop and no friction rub.   No murmur heard. Pulmonary/Chest: Effort normal and breath sounds normal. No respiratory distress. He exhibits tenderness.    Abdominal: Soft. Bowel sounds are normal.  Neurological: He is alert and oriented to person, place, and time. He exhibits normal muscle tone. Coordination normal.  Skin: Skin is warm and dry. No rash noted. No erythema.    ED Course  Procedures (including critical care time) Labs Review Labs Reviewed - No data to display  Imaging Review Dg Ribs Unilateral W/chest Left  05/28/2014   CLINICAL DATA:  Fall left side.  Pain.  Initial evaluation .  EXAM: LEFT RIBS AND CHEST -  3+ VIEW  COMPARISON:  None.  FINDINGS: Left posterior lateral sixth, seventh, eighth, ninth, and tenth rib fractures are noted. No pneumothorax. Questionable nodule left lung base. Lungs are clear of infiltrates. Heart size normal. Degenerative changes thoracic spine.  IMPRESSION: 1. Left posterior lateral sixth, seventh, eighth, ninth, and tenth minimally displaced rib fractures. 2. Possible pulmonary nodule left lung base. Standard follow-up and lateral chest x-ray suggested.   Electronically Signed   By:  Marcello Moores  Register   On: 05/28/2014 22:59    Patient is comfortable and breathing without difficulty in the room at this time.  The patient has pain that is not uncontrolled the patient will be given incentive spirometry and advised followup with his primary care Dr. told to use ice and heat on the area that is sore will. give work note and restrictions to return to work.    Brent General, PA-C 05/29/14 [phone]

## 2014-05-30 ENCOUNTER — Ambulatory Visit (INDEPENDENT_AMBULATORY_CARE_PROVIDER_SITE_OTHER): Payer: Managed Care, Other (non HMO)

## 2014-05-30 ENCOUNTER — Other Ambulatory Visit: Payer: Self-pay | Admitting: Emergency Medicine

## 2014-05-30 ENCOUNTER — Ambulatory Visit (INDEPENDENT_AMBULATORY_CARE_PROVIDER_SITE_OTHER): Payer: Managed Care, Other (non HMO) | Admitting: Emergency Medicine

## 2014-05-30 VITALS — BP 168/86 | HR 95 | Temp 97.7°F | Resp 20 | Ht 67.5 in | Wt 222.6 lb

## 2014-05-30 DIAGNOSIS — E1165 Type 2 diabetes mellitus with hyperglycemia: Secondary | ICD-10-CM

## 2014-05-30 DIAGNOSIS — S2242XS Multiple fractures of ribs, left side, sequela: Secondary | ICD-10-CM

## 2014-05-30 DIAGNOSIS — IMO0002 Reserved for concepts with insufficient information to code with codable children: Secondary | ICD-10-CM

## 2014-05-30 DIAGNOSIS — E785 Hyperlipidemia, unspecified: Secondary | ICD-10-CM

## 2014-05-30 DIAGNOSIS — I10 Essential (primary) hypertension: Secondary | ICD-10-CM

## 2014-05-30 DIAGNOSIS — R911 Solitary pulmonary nodule: Secondary | ICD-10-CM

## 2014-05-30 LAB — POCT CBC
Granulocyte percent: 88.7 %G — AB (ref 37–80)
HCT, POC: 49.5 % (ref 43.5–53.7)
Hemoglobin: 16.4 g/dL (ref 14.1–18.1)
LYMPH, POC: 1.6 (ref 0.6–3.4)
MCH, POC: 29.4 pg (ref 27–31.2)
MCHC: 33.2 g/dL (ref 31.8–35.4)
MCV: 88.7 fL (ref 80–97)
MID (cbc): 0.1 (ref 0–0.9)
MPV: 7.1 fL (ref 0–99.8)
POC GRANULOCYTE: 13 — AB (ref 2–6.9)
POC LYMPH PERCENT: 10.7 %L (ref 10–50)
POC MID %: 0.6 % (ref 0–12)
Platelet Count, POC: 248 10*3/uL (ref 142–424)
RBC: 5.58 M/uL (ref 4.69–6.13)
RDW, POC: 14 %
WBC: 14.6 10*3/uL — AB (ref 4.6–10.2)

## 2014-05-30 LAB — COMPLETE METABOLIC PANEL WITH GFR
ALT: 11 U/L (ref 0–53)
AST: 11 U/L (ref 0–37)
Albumin: 4.4 g/dL (ref 3.5–5.2)
Alkaline Phosphatase: 91 U/L (ref 39–117)
BILIRUBIN TOTAL: 1.5 mg/dL — AB (ref 0.2–1.2)
BUN: 25 mg/dL — ABNORMAL HIGH (ref 6–23)
CHLORIDE: 99 meq/L (ref 96–112)
CO2: 24 mEq/L (ref 19–32)
CREATININE: 1.15 mg/dL (ref 0.50–1.35)
Calcium: 9.8 mg/dL (ref 8.4–10.5)
GFR, Est African American: 82 mL/min
GFR, Est Non African American: 71 mL/min
Glucose, Bld: 340 mg/dL — ABNORMAL HIGH (ref 70–99)
Potassium: 4.5 mEq/L (ref 3.5–5.3)
Sodium: 135 mEq/L (ref 135–145)
Total Protein: 7.2 g/dL (ref 6.0–8.3)

## 2014-05-30 LAB — POCT GLYCOSYLATED HEMOGLOBIN (HGB A1C): HEMOGLOBIN A1C: 10.1

## 2014-05-30 LAB — GLUCOSE, POCT (MANUAL RESULT ENTRY): POC Glucose: 334 mg/dl — AB (ref 70–99)

## 2014-05-30 MED ORDER — METFORMIN HCL 1000 MG PO TABS: 1000.0000 mg | ORAL_TABLET | Freq: Two times a day (BID) | ORAL | Status: DC

## 2014-05-30 MED ORDER — ATORVASTATIN CALCIUM 40 MG PO TABS: 40.0000 mg | ORAL_TABLET | Freq: Every day | ORAL | Status: DC

## 2014-05-30 MED ORDER — DOXYCYCLINE HYCLATE 100 MG PO TABS: 100.0000 mg | ORAL_TABLET | Freq: Two times a day (BID) | ORAL | Status: DC

## 2014-05-30 MED ORDER — OXYCODONE-ACETAMINOPHEN 5-325 MG PO TABS: 1.0000 | ORAL_TABLET | Freq: Four times a day (QID) | ORAL | Status: DC | PRN

## 2014-05-30 MED ORDER — GLIMEPIRIDE 4 MG PO TABS: 4.0000 mg | ORAL_TABLET | Freq: Every day | ORAL | Status: DC

## 2014-05-30 MED ORDER — METOPROLOL TARTRATE 50 MG PO TABS: 50.0000 mg | ORAL_TABLET | Freq: Two times a day (BID) | ORAL | Status: DC

## 2014-05-30 MED ORDER — HYDROCHLOROTHIAZIDE 25 MG PO TABS: 25.0000 mg | ORAL_TABLET | Freq: Every day | ORAL | Status: DC

## 2014-05-30 NOTE — Progress Notes (Signed)
Subjective:    Patient ID: Dylan Jacobs, male    DOB: Jan 07, 1961, 53 y.o.   MRN: [id number] This chart was scribed for Arlyss Queen, MD by Marti Sleigh, Medical Scribe. This patient was seen in Room 2 and the patient's care was started at 10:32 AM.  HPI HPI Comments: Dylan Jacobs is a 53 y.o. male with a past hx of DM who presents to [facility] complaining of broken ribs that occurred three days ago. Pt states that he tripped on a carpet and fell on concrete. Pt states he reported to ED two days ago after the fall, and upon x-ray determined that he had five broken ribs and a possible pulmonary nodule. Pt denies SOB, trouble breathing, or abdominal pain.  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 Patient admits to smoking.   Review of Systems  Gastrointestinal: Negative for abdominal pain.  Musculoskeletal: Positive for arthralgias.  Skin: Negative for rash.  Neurological: Negative for headaches.  Psychiatric/Behavioral: Negative for agitation.       Objective:   Physical Exam  Nursing note and vitals reviewed. Constitutional: He is oriented to person, place, and time. He appears well-developed and well-nourished.  HENT:  Head: Normocephalic and atraumatic.  Eyes: Pupils are equal, round, and reactive to light.  Neck: No JVD present.  Cardiovascular: Normal rate, regular rhythm and normal heart sounds.   Pulmonary/Chest: Effort normal.  Decreased breath sounds in the base with occasional rhonchi. TPP left chest wall.  Abdominal: Soft. Bowel sounds are normal. There is no splenomegaly.  No TPP over spleen.   Musculoskeletal: He exhibits tenderness.  TPP left chest wall.  Neurological: He is alert and oriented to person, place, and time.  Skin: Skin is warm and dry.  Psychiatric: He has a normal mood and affect. His behavior is normal.  [facility] reading (PRIMARY) by  Dr Everlene Farrier there are multiple left-sided rib fractures. Please comment on a line in the  left apices is there any evidence of a pneumothorax? Results for orders placed in visit on 05/30/14  GLUCOSE, POCT (MANUAL RESULT ENTRY)      Result Value Ref Range   POC Glucose 334 (*) 70 - 99 mg/dl  POCT CBC      Result Value Ref Range   WBC 14.6 (*) 4.6 - 10.2 K/uL   Lymph, poc 1.6  0.6 - 3.4   POC LYMPH PERCENT 10.7  10 - 50 %L   MID (cbc) 0.1  0 - 0.9   POC MID % 0.6  0 - 12 %M   POC Granulocyte 13.0 (*) 2 - 6.9   Granulocyte percent 88.7 (*) 37 - 80 %G   RBC 5.58  4.69 - 6.13 M/uL   Hemoglobin 16.4  14.1 - 18.1 g/dL   HCT, POC 49.5  43.5 - 53.7 %   MCV 88.7  80 - 97 fL   MCH, POC 29.4  27 - 31.2 pg   MCHC 33.2  31.8 - 35.4 g/dL   RDW, POC 14.0     Platelet Count, POC 248  142 - 424 K/uL   MPV 7.1  0 - 99.8 fL  POCT GLYCOSYLATED HEMOGLOBIN (HGB A1C)      Result Value Ref Range   Hemoglobin A1C 10.1             Assessment & Plan:  Sugar not under control. His white count is elevated. We'll place him on antibiotics to prevent secondary infection  due to his elevated count diabetic meds were refilled . Radiologist reads his films as the fracture along with a left lower lobe nodule. We'll proceed with CT chest to evaluate this.I personally performed the services described in this documentation, which was scribed in my presence. The recorded information has been reviewed and is accurate. Patient at high risk because of not being compliant with medications for his diabetes. We'll recheck 1 week. Pain meds were refilled I personally performed the services described in this documentation, which was scribed in my presence. The recorded information has been reviewed and is accurate.

## 2014-06-01 LAB — BILIRUBIN, FRACTIONATED(TOT/DIR/INDIR)
BILIRUBIN INDIRECT: 1.3 mg/dL — AB (ref 0.2–1.2)
Bilirubin, Direct: 0.2 mg/dL (ref 0.0–0.3)
Total Bilirubin: 1.5 mg/dL — ABNORMAL HIGH (ref 0.2–1.2)

## 2014-06-01 NOTE — ED Provider Notes (Signed)
Medical screening examination/treatment/procedure(s) were conducted as a shared visit with non-physician practitioner(s) and myself.  I personally evaluated the patient during the encounter.   EKG Interpretation None      The patient presents following a fall greater than 24 hours ago. Reports a mechanical fall where he fell onto his left side. He is reporting pain over his left chest it is worse with breathing. He is a smoker. Imaging reveals left-sided rib fractures. On exam, patient is satting percent on room air. He is not splinting. Discussed the patient aggressive pain control, incentive spirometry for treatment of his rib fractures. Patient stated understanding.  After history, exam, and medical workup I feel the patient has been appropriately medically screened and is safe for discharge home. Pertinent diagnoses were discussed with the patient. Patient was given return precautions.   Merryl Hacker, MD 06/01/14 [phone]

## 2014-07-25 ENCOUNTER — Other Ambulatory Visit: Payer: Self-pay | Admitting: Family Medicine

## 2014-10-05 ENCOUNTER — Other Ambulatory Visit: Payer: Self-pay

## 2014-10-05 DIAGNOSIS — I10 Essential (primary) hypertension: Secondary | ICD-10-CM

## 2014-10-05 MED ORDER — HYDROCHLOROTHIAZIDE 25 MG PO TABS: 25.0000 mg | ORAL_TABLET | Freq: Every day | ORAL | Status: DC

## 2015-03-20 ENCOUNTER — Other Ambulatory Visit: Payer: Self-pay | Admitting: Emergency Medicine

## 2015-03-24 ENCOUNTER — Encounter: Payer: Self-pay | Admitting: *Deleted

## 2015-05-30 ENCOUNTER — Other Ambulatory Visit: Payer: Self-pay | Admitting: Emergency Medicine

## 2015-11-03 ENCOUNTER — Emergency Department (HOSPITAL_BASED_OUTPATIENT_CLINIC_OR_DEPARTMENT_OTHER): Payer: Managed Care, Other (non HMO)

## 2015-11-03 ENCOUNTER — Encounter (HOSPITAL_BASED_OUTPATIENT_CLINIC_OR_DEPARTMENT_OTHER): Payer: Self-pay | Admitting: *Deleted

## 2015-11-03 ENCOUNTER — Emergency Department (HOSPITAL_BASED_OUTPATIENT_CLINIC_OR_DEPARTMENT_OTHER)
Admission: EM | Admit: 2015-11-03 | Discharge: 2015-11-04 | Disposition: A | Discharge status: Home / Self Care | Payer: Managed Care, Other (non HMO) | Attending: Emergency Medicine | Admitting: Emergency Medicine

## 2015-11-03 DIAGNOSIS — Z87891 Personal history of nicotine dependence: Secondary | ICD-10-CM | POA: Diagnosis not present

## 2015-11-03 DIAGNOSIS — Z23 Encounter for immunization: Secondary | ICD-10-CM | POA: Insufficient documentation

## 2015-11-03 DIAGNOSIS — Z8673 Personal history of transient ischemic attack (TIA), and cerebral infarction without residual deficits: Secondary | ICD-10-CM | POA: Diagnosis not present

## 2015-11-03 DIAGNOSIS — I10 Essential (primary) hypertension: Secondary | ICD-10-CM | POA: Insufficient documentation

## 2015-11-03 DIAGNOSIS — Y9389 Activity, other specified: Secondary | ICD-10-CM | POA: Diagnosis not present

## 2015-11-03 DIAGNOSIS — W19XXXA Unspecified fall, initial encounter: Secondary | ICD-10-CM

## 2015-11-03 DIAGNOSIS — Z79899 Other long term (current) drug therapy: Secondary | ICD-10-CM | POA: Diagnosis not present

## 2015-11-03 DIAGNOSIS — S0101XA Laceration without foreign body of scalp, initial encounter: Secondary | ICD-10-CM | POA: Diagnosis not present

## 2015-11-03 DIAGNOSIS — W07XXXA Fall from chair, initial encounter: Secondary | ICD-10-CM | POA: Insufficient documentation

## 2015-11-03 DIAGNOSIS — Y998 Other external cause status: Secondary | ICD-10-CM | POA: Diagnosis not present

## 2015-11-03 DIAGNOSIS — Z792 Long term (current) use of antibiotics: Secondary | ICD-10-CM | POA: Diagnosis not present

## 2015-11-03 DIAGNOSIS — S0990XA Unspecified injury of head, initial encounter: Secondary | ICD-10-CM | POA: Diagnosis present

## 2015-11-03 DIAGNOSIS — E119 Type 2 diabetes mellitus without complications: Secondary | ICD-10-CM | POA: Insufficient documentation

## 2015-11-03 DIAGNOSIS — Y9289 Other specified places as the place of occurrence of the external cause: Secondary | ICD-10-CM | POA: Insufficient documentation

## 2015-11-03 DIAGNOSIS — Z8781 Personal history of (healed) traumatic fracture: Secondary | ICD-10-CM | POA: Diagnosis not present

## 2015-11-03 DIAGNOSIS — Z7984 Long term (current) use of oral hypoglycemic drugs: Secondary | ICD-10-CM | POA: Diagnosis not present

## 2015-11-03 HISTORY — DX: Essential (primary) hypertension: I10

## 2015-11-03 HISTORY — DX: Type 2 diabetes mellitus without complications: E11.9

## 2015-11-03 MED ORDER — TETANUS-DIPHTH-ACELL PERTUSSIS 5-2.5-18.5 LF-MCG/0.5 IM SUSP
0.5000 mL | Freq: Once | INTRAMUSCULAR | Status: AC
  Administered 2015-11-04: 0.5 mL via INTRAMUSCULAR
  Filled 2015-11-03: qty 0.5

## 2015-11-03 MED ORDER — LIDOCAINE-EPINEPHRINE 2 %-1:100000 IJ SOLN
20.0000 mL | Freq: Once | INTRAMUSCULAR | Status: AC
  Administered 2015-11-04: 20 mL via INTRADERMAL
  Filled 2015-11-03: qty 1

## 2015-11-03 NOTE — ED Provider Notes (Signed)
CSN: [id number]     Arrival date & time 11/03/15  2302 History  By signing my name below, I, Evelene Croon, attest that this documentation has been prepared under the direction and in the presence of Mordechai Matuszak, MD . Electronically Signed: Evelene Croon, Scribe. 11/03/2015. 12:04 AM.    Chief Complaint  Patient presents with  . Head Laceration    Patient is a 55 y.o. male presenting with scalp laceration and fall. The history is provided by the patient. No language interpreter was used.  Head Laceration This is a new problem. The current episode started less than 1 hour ago. The problem occurs constantly. The problem has not changed since onset.Pertinent negatives include no chest pain, no abdominal pain, no headaches and no shortness of breath. Nothing aggravates the symptoms. He has tried nothing for the symptoms. The treatment provided no relief.  Fall This is a new problem. The current episode started 1 to 2 hours ago. The problem occurs constantly. The problem has not changed since onset.Pertinent negatives include no chest pain, no abdominal pain, no headaches and no shortness of breath. Nothing aggravates the symptoms. Nothing relieves the symptoms. He has tried nothing for the symptoms. The treatment provided no relief.   HPI Comments:  Dylan Jacobs is a 55 y.o. male who presents to the Emergency Department complaining of laceration to his head ~ 2200 tonight with mild pain to the site. Pt states he fell off a chair and struck his head on table; admits to having ETOH onboard. He denies LOC. Tetanus is not UTD.  Pt has no other complaints, injuries, or symptoms at this time. Bleeding controlled PTA.   Past Medical History  Diagnosis Date  . Ulcer   . Stroke ([place])   . Fractured rib   . Diabetes mellitus without complication ([place])   . Hypertension    History reviewed. No pertinent past surgical history. Family History  Problem Relation Age of Onset  . Prostate cancer Father    . Cancer Brother   . ALS Brother    Social History  Substance Use Topics  . Smoking status: Former Smoker -- 2.00 packs/day for 30 years    Types: Cigarettes    Quit date: 12/31/2009  . Smokeless tobacco: None  . Alcohol Use: Yes    Review of Systems  Respiratory: Negative for shortness of breath.   Cardiovascular: Negative for chest pain.  Gastrointestinal: Negative for abdominal pain.  Skin: Positive for wound.  Neurological: Negative for syncope, weakness and headaches.  All other systems reviewed and are negative.   Allergies  Review of patient's allergies indicates no known allergies.  Home Medications   Prior to Admission medications   Medication Sig Start Date End Date Taking? Authorizing Provider  atorvastatin (LIPITOR) 40 MG tablet Take 1 tablet (40 mg total) by mouth daily. 05/30/14   Darlyne Russian, MD  doxycycline (VIBRA-TABS) 100 MG tablet Take 1 tablet (100 mg total) by mouth 2 (two) times daily. 05/30/14   Darlyne Russian, MD  glimepiride (AMARYL) 4 MG tablet Take 1 tablet (4 mg total) by mouth daily. 05/30/14   Darlyne Russian, MD  hydrochlorothiazide (HYDRODIURIL) 25 MG tablet Take 1 tablet (25 mg total) by mouth daily. 10/05/14   [name], MD  ibuprofen (ADVIL,MOTRIN) 800 MG tablet Take 1 tablet (800 mg total) by mouth every 8 (eight) hours as needed. 05/29/14   Dalia Heading, PA-C  metFORMIN (GLUCOPHAGE) 1000 MG tablet Take 1 tablet (1,000  mg total) by mouth 2 (two) times daily with a meal. PATIENT NEEDS OFFICE VISIT FOR ADDITIONAL REFILLS 03/21/15   Jaynee Eagles, PA-C  metoprolol (LOPRESSOR) 50 MG tablet Take 1 tablet (50 mg total) by mouth 2 (two) times daily. PATIENT NEEDS OFFICE VISIT FOR ADDITIONAL REFILLS 03/21/15   Jaynee Eagles, PA-C  oxyCODONE-acetaminophen (PERCOCET/ROXICET) 5-325 MG per tablet Take 1 tablet by mouth every 6 (six) hours as needed for severe pain. 05/30/14   Darlyne Russian, MD   BP 126/79 mmHg  Pulse 90  Temp(Src) 98.3 F (36.8 C)  (Oral)  Resp 16  SpO2 95% Physical Exam  Constitutional: He is oriented to person, place, and time. He appears well-developed and well-nourished. No distress.  HENT:  Head: Normocephalic. Head is without raccoon's eyes and without Battle's sign.    Right Ear: No hemotympanum.  Left Ear: No hemotympanum.  Mouth/Throat: Oropharynx is clear and moist. No oropharyngeal exudate.  Moist mucous membranes   Eyes: Conjunctivae are normal. Pupils are equal, round, and reactive to light.  Neck: Normal range of motion. Neck supple. No JVD present.  Trachea midline No step off or crepitus of the C-spine  Cardiovascular: Normal rate, regular rhythm and normal heart sounds.   Pulmonary/Chest: Effort normal and breath sounds normal. No respiratory distress. He has no wheezes. He has no rales.  Abdominal: Soft. Bowel sounds are normal. He exhibits no distension. There is no tenderness.  Musculoskeletal: Normal range of motion. He exhibits no edema or tenderness.       Cervical back: Normal.  Neurological: He is alert and oriented to person, place, and time. He has normal reflexes.  Skin: Skin is warm and dry.   2.25 in longitudinal laceration starting on forehead into scalp just right of center; hemostatic   Psychiatric: He has a normal mood and affect. His behavior is normal.  Nursing note and vitals reviewed.   ED Course  Procedures   DIAGNOSTIC STUDIES:  Oxygen Saturation is 95% on RA, adequate by my interpretation.    COORDINATION OF CARE:  11:49 PM Discussed treatment plan with pt at bedside and pt agreed to plan.    MDM   Final diagnoses:  None   See procedure note  Wound care instructions given.  Staple removal in 7 days at urgent care.  No alcohol.    I personally performed the services described in this documentation, which was scribed in my presence. The recorded information has been reviewed and is accurate.      Veatrice Kells, MD 11/04/15 [phone]

## 2015-11-03 NOTE — ED Notes (Signed)
[facility],  Lac approx 2 inches to top of head  No loc  Bleeding controlled   etoh

## 2015-11-04 ENCOUNTER — Encounter (HOSPITAL_BASED_OUTPATIENT_CLINIC_OR_DEPARTMENT_OTHER): Payer: Self-pay | Admitting: Emergency Medicine

## 2015-11-04 NOTE — ED Notes (Cosign Needed)
Laceration repair of the forehead per request of Dr. Gregorey Buba.    LACERATION REPAIR Performed by: Domenic Moras Authorized byDomenic Moras Consent: Verbal consent obtained. Risks and benefits: risks, benefits and alternatives were discussed Consent given by: patient Patient identity confirmed: provided demographic data Prepped and Draped in normal sterile fashion Wound explored  Laceration Location: forehead  Laceration Length: 8cm  No Foreign Bodies seen or palpated  Anesthesia: local infiltration  Local anesthetic: lidocaine 2% w epinephrine  Anesthetic total: 5 ml  Irrigation method: syringe Amount of cleaning: standard  Skin closure: surgical staples  Number of staples: 6  Technique: surgical staples  Patient tolerance: Patient tolerated the procedure well with no immediate complications.   Domenic Moras, PA-C 11/04/15 [phone]

## 2015-11-04 NOTE — ED Notes (Signed)
PA at bedside.

## 2015-11-04 NOTE — Discharge Instructions (Signed)
Fall Prevention in Hospitals, Adult As a hospital patient, your condition and the treatments you receive can increase your risk for falls. Some additional risk factors for falls in a hospital include:  Being in an unfamiliar environment.  Being on bed rest.  Your surgery.  Taking certain medicines.  Your tubing requirements, such as intravenous (IV) therapy or catheters. It is important that you learn how to decrease fall risks while at the hospital. Below are important tips that can help prevent falls. SAFETY TIPS FOR PREVENTING FALLS Talk about your risk of falling.  Ask your health care provider why you are at risk for falling. Is it your medicine, illness, tubing placement, or something else?  Make a plan with your health care provider to keep you safe from falls.  Ask your health care provider or pharmacist about side effects of your medicines. Some medicines can make you dizzy or affect your coordination. Ask for help.  Ask for help before getting out of bed. You may need to press your call button.  Ask for assistance in getting safely to the toilet.  Ask for a walker or cane to be put at your bedside. Ask that most of the side rails on your bed be placed up before your health care provider leaves the room.  Ask family or friends to sit with you.  Ask for things that are out of your reach, such as your glasses, hearing aids, telephone, bedside table, or call button. Follow these tips to avoid falling:  Stay lying or seated, rather than standing, while waiting for help.  Wear rubber-soled slippers or shoes whenever you walk in the hospital.  Avoid quick, sudden movements.  Change positions slowly.  Sit on the side of your bed before standing.  Stand up slowly and wait before you start to walk.  Let your health care provider know if there is a spill on the floor.  Pay careful attention to the medical equipment, electrical cords, and tubes around you.  When you  need help, use your call button by your bed or in the bathroom. Wait for one of your health care providers to help you.  If you feel dizzy or unsure of your footing, return to bed and wait for assistance.  Avoid being distracted by the TV, telephone, or another person in your room.  Do not lean or support yourself on rolling objects, such as IV poles or bedside tables.   This information is not intended to replace advice given to you by your health care provider. Make sure you discuss any questions you have with your health care provider.   Document Released: 08/02/2000 Document Revised: 08/26/2014 Document Reviewed: 04/12/2012 Elsevier Interactive Patient Education 2016 [place], Adult A laceration is a cut that goes through all of the layers of the skin and into the tissue that is right under the skin. Some lacerations heal on their own. Others need to be closed with stitches (sutures), staples, skin adhesive strips, or skin glue. Proper laceration care minimizes the risk of infection and helps the laceration to heal better. HOW TO CARE FOR YOUR LACERATION If sutures or staples were used:  Keep the wound clean and dry.  If you were given a bandage (dressing), you should change it at least one time per day or as told by your health care provider. You should also change it if it becomes wet or dirty.  Keep the wound completely dry for the first 24 hours or  as told by your health care provider. After that time, you may shower or bathe. However, make sure that the wound is not soaked in water until after the sutures or staples have been removed.  Clean the wound one time each day or as told by your health care provider:  Wash the wound with soap and water.  Rinse the wound with water to remove all soap.  Pat the wound dry with a clean towel. Do not rub the wound.  After cleaning the wound, apply a thin layer of antibiotic ointmentas told by your health care  provider. This will help to prevent infection and keep the dressing from sticking to the wound.  Have the sutures or staples removed as told by your health care provider. If skin adhesive strips were used:  Keep the wound clean and dry.  If you were given a bandage (dressing), you should change it at least one time per day or as told by your health care provider. You should also change it if it becomes dirty or wet.  Do not get the skin adhesive strips wet. You may shower or bathe, but be careful to keep the wound dry.  If the wound gets wet, pat it dry with a clean towel. Do not rub the wound.  Skin adhesive strips fall off on their own. You may trim the strips as the wound heals. Do not remove skin adhesive strips that are still stuck to the wound. They will fall off in time. If skin glue was used:  Try to keep the wound dry, but you may briefly wet it in the shower or bath. Do not soak the wound in water, such as by swimming.  After you have showered or bathed, gently pat the wound dry with a clean towel. Do not rub the wound.  Do not do any activities that will make you sweat heavily until the skin glue has fallen off on its own.  Do not apply liquid, cream, or ointment medicine to the wound while the skin glue is in place. Using those may loosen the film before the wound has healed.  If you were given a bandage (dressing), you should change it at least one time per day or as told by your health care provider. You should also change it if it becomes dirty or wet.  If a dressing is placed over the wound, be careful not to apply tape directly over the skin glue. Doing that may cause the glue to be pulled off before the wound has healed.  Do not pick at the glue. The skin glue usually remains in place for 5-10 days, then it falls off of the skin. General Instructions  Take over-the-counter and prescription medicines only as told by your health care provider.  If you were prescribed  an antibiotic medicine or ointment, take or apply it as told by your doctor. Do not stop using it even if your condition improves.  To help prevent scarring, make sure to cover your wound with sunscreen whenever you are outside after stitches are removed, after adhesive strips are removed, or when glue remains in place and the wound is healed. Make sure to wear a sunscreen of at least 30 SPF.  Do not scratch or pick at the wound.  Keep all follow-up visits as told by your health care provider. This is important.  Check your wound every day for signs of infection. Watch for:  Redness, swelling, or pain.  Fluid, blood, or  pus.  Raise (elevate) the injured area above the level of your heart while you are sitting or lying down, if possible. SEEK MEDICAL CARE IF:  You received a tetanus shot and you have swelling, severe pain, redness, or bleeding at the injection site.  You have a fever.  A wound that was closed breaks open.  You notice a bad smell coming from your wound or your dressing.  You notice something coming out of the wound, such as wood or glass.  Your pain is not controlled with medicine.  You have increased redness, swelling, or pain at the site of your wound.  You have fluid, blood, or pus coming from your wound.  You notice a change in the color of your skin near your wound.  You need to change the dressing frequently due to fluid, blood, or pus draining from the wound.  You develop a new rash.  You develop numbness around the wound. SEEK IMMEDIATE MEDICAL CARE IF:  You develop severe swelling around the wound.  Your pain suddenly increases and is severe.  You develop painful lumps near the wound or on skin that is anywhere on your body.  You have a red streak going away from your wound.  The wound is on your hand or foot and you cannot properly move a finger or toe.  The wound is on your hand or foot and you notice that your fingers or toes look pale or  bluish.   This information is not intended to replace advice given to you by your health care provider. Make sure you discuss any questions you have with your health care provider.   Document Released: 08/05/2005 Document Revised: 12/20/2014 Document Reviewed: 08/01/2014 Elsevier Interactive Patient Education [employer].

## 2015-11-04 NOTE — ED Notes (Addendum)
Patient transported to CT 

## 2015-11-11 ENCOUNTER — Emergency Department (INDEPENDENT_AMBULATORY_CARE_PROVIDER_SITE_OTHER)
Admission: EM | Admit: 2015-11-11 | Discharge: 2015-11-11 | Disposition: A | Discharge status: Home / Self Care | Payer: Managed Care, Other (non HMO) | Source: Home / Self Care | Attending: Family Medicine | Admitting: Family Medicine

## 2015-11-11 DIAGNOSIS — IMO0002 Reserved for concepts with insufficient information to code with codable children: Secondary | ICD-10-CM

## 2015-11-11 DIAGNOSIS — Z4802 Encounter for removal of sutures: Secondary | ICD-10-CM | POA: Diagnosis not present

## 2015-11-11 DIAGNOSIS — E785 Hyperlipidemia, unspecified: Secondary | ICD-10-CM

## 2015-11-11 DIAGNOSIS — Z760 Encounter for issue of repeat prescription: Secondary | ICD-10-CM

## 2015-11-11 DIAGNOSIS — E1165 Type 2 diabetes mellitus with hyperglycemia: Secondary | ICD-10-CM

## 2015-11-11 DIAGNOSIS — I10 Essential (primary) hypertension: Secondary | ICD-10-CM

## 2015-11-11 MED ORDER — METFORMIN HCL 1000 MG PO TABS: 1000.0000 mg | ORAL_TABLET | Freq: Two times a day (BID) | ORAL | Status: DC

## 2015-11-11 MED ORDER — HYDROCHLOROTHIAZIDE 25 MG PO TABS: 25.0000 mg | ORAL_TABLET | Freq: Every day | ORAL | Status: DC

## 2015-11-11 MED ORDER — ATORVASTATIN CALCIUM 40 MG PO TABS: 40.0000 mg | ORAL_TABLET | Freq: Every day | ORAL | Status: DC

## 2015-11-11 NOTE — ED Notes (Signed)
Had staples places across right scalp a week ago. Here for removal.

## 2015-11-11 NOTE — ED Provider Notes (Signed)
CSN: [id number]     Arrival date & time 11/11/15  4098 History   First MD Initiated Contact with Patient 11/11/15 [phone]     Chief Complaint  Patient presents with  . Suture / Staple Removal   (Consider location/radiation/quality/duration/timing/severity/associated sxs/prior Treatment) HPI The pt is a 55yo male presenting [facility] for removal of staples that were placed in his scalp on 11/03/15 after he fell off a chair at home.  Staples were placed at [facility].  Denies any trouble with the staples. Reports only mild bleeding.  Denies headache, nausea or vomiting. Denies fever or chills.  Pt also requesting refill on his blood pressure, cholesterol, and diabetes medicine as he does not have a PCP at this time.  He has not taken his BP medication in at least 1 month which is why pt states his BP is probably high. Denies chest pain or SOB.   Past Medical History  Diagnosis Date  . Ulcer   . Stroke ([place])   . Fractured rib   . Diabetes mellitus without complication ([place])   . Hypertension    History reviewed. No pertinent past surgical history. Family History  Problem Relation Age of Onset  . Prostate cancer Father   . Cancer Brother   . ALS Brother    Social History  Substance Use Topics  . Smoking status: Former Smoker -- 2.00 packs/day for 30 years    Types: Cigarettes    Quit date: 12/31/2009  . Smokeless tobacco: None  . Alcohol Use: Yes    Review of Systems  Gastrointestinal: Negative for nausea and vomiting.  Skin: Positive for wound. Negative for color change.  Neurological: Negative for dizziness, numbness and headaches.    Allergies  Review of patient's allergies indicates no known allergies.  Home Medications   Prior to Admission medications   Medication Sig Start Date End Date Taking? Authorizing Provider  atorvastatin (LIPITOR) 40 MG tablet Take 1 tablet (40 mg total) by mouth daily. 11/11/15   Noland Fordyce, PA-C  doxycycline (VIBRA-TABS)  100 MG tablet Take 1 tablet (100 mg total) by mouth 2 (two) times daily. 05/30/14   Darlyne Russian, MD  glimepiride (AMARYL) 4 MG tablet Take 1 tablet (4 mg total) by mouth daily. 05/30/14   Darlyne Russian, MD  hydrochlorothiazide (HYDRODIURIL) 25 MG tablet Take 1 tablet (25 mg total) by mouth daily. 11/11/15   Noland Fordyce, PA-C  ibuprofen (ADVIL,MOTRIN) 800 MG tablet Take 1 tablet (800 mg total) by mouth every 8 (eight) hours as needed. 05/29/14   Dalia Heading, PA-C  metFORMIN (GLUCOPHAGE) 1000 MG tablet Take 1 tablet (1,000 mg total) by mouth 2 (two) times daily with a meal. PATIENT NEEDS OFFICE VISIT FOR ADDITIONAL REFILLS 11/11/15   Noland Fordyce, PA-C  metoprolol (LOPRESSOR) 50 MG tablet Take 1 tablet (50 mg total) by mouth 2 (two) times daily. PATIENT NEEDS OFFICE VISIT FOR ADDITIONAL REFILLS 03/21/15   Jaynee Eagles, PA-C  oxyCODONE-acetaminophen (PERCOCET/ROXICET) 5-325 MG per tablet Take 1 tablet by mouth every 6 (six) hours as needed for severe pain. 05/30/14   Darlyne Russian, MD   Meds Ordered and Administered this Visit  Medications - No data to display  BP 196/107 mmHg  Pulse 100  Temp(Src) 98.1 F (36.7 C) (Oral)  Ht '5\' 9"'$  (1.753 m)  Wt 221 lb 12 oz (100.585 kg)  BMI 32.73 kg/m2  SpO2 94% No data found.   Physical Exam  Constitutional: He is oriented  to person, place, and time. He appears well-developed and well-nourished.  HENT:  Head: Normocephalic. Head is with laceration.    Large well healing laceration with scant dried blood, 6 staples in place. Non-tender. Mild bleeding with palpation toward the most superior aspect of laceration.   Eyes: EOM are normal.  Neck: Normal range of motion.  Cardiovascular: Normal rate.   Pulmonary/Chest: Effort normal.  Musculoskeletal: Normal range of motion.  Neurological: He is alert and oriented to person, place, and time.  Skin: Skin is warm and dry.  Psychiatric: He has a normal mood and affect. His behavior is normal.    Nursing note and vitals reviewed.   ED Course  .Suture Removal Date/Time: 11/11/2015 9:50 AM Performed by: Noland Fordyce Authorized by: Theone Murdoch A Consent: Verbal consent obtained. Risks and benefits: risks, benefits and alternatives were discussed Consent given by: patient Patient understanding: patient states understanding of the procedure being performed Patient consent: the patient's understanding of the procedure matches consent given Required items: required blood products, implants, devices, and special equipment available Patient identity confirmed: verbally with patient Time out: Immediately prior to procedure a "time out" was called to verify the correct patient, procedure, equipment, support staff and site/side marked as required. Body area: head/neck Location details: scalp Wound Appearance: clean and pink Staples Removed: 6 Post-removal: Steri-Strips applied (wound glue) Sutures placed in this facility: [facility]. Patient tolerance: Patient tolerated the procedure well with no immediate complications   (including critical care time)  Labs Review Labs Reviewed - No data to display  Imaging Review No results found.    MDM   1. Encounter for staple removal   2. Medication refill   3. Hyperlipidemia   4. Diabetes mellitus type 2, uncontrolled ([place])   5. Essential hypertension    Pt presenting to [facility] for staple removal.  Six staples removed w/o immediate complication.  Most superior aspect of laceration still needs time to heal.  Two steri strips placed and wound glue applied.  Home care instructions provided.  Advised to f/u in 1 week with PCP or return to [facility] for wound recheck if not healing, or signs of infection: increased pain, redness, draining pus, or fever.  Patient verbalized understanding and agreement with treatment plan.  Maintenance medications refilled: Metformin, hydrochlorothiazide, and atorvastatin.  Strongly  encouraged pt f/u with PCP for ongoing healthcare needs including medication refills and monitoring. Patient verbalized understanding and agreement with treatment plan.     Noland Fordyce, PA-C 11/11/15 1006

## 2015-11-11 NOTE — Discharge Instructions (Signed)
Medicine Refill in Urgent Care/Emergency Department We have refilled your medicine today, but it is best for you to get refills through your primary health care provider's office. In the future, please plan ahead so you do not need to get refills from the emergency department. If the medicine we refilled was a maintenance medicine, you may have received only enough to get you by until you are able to see your regular health care provider.   This information is not intended to replace advice given to you by your health care provider. Make sure you discuss any questions you have with your health care provider.   Document Released: 11/22/2003 Document Revised: 08/26/2014 Document Reviewed: 11/12/2013 Elsevier Interactive Patient Education [employer].

## 2018-07-10 ENCOUNTER — Encounter (HOSPITAL_COMMUNITY): Payer: Self-pay

## 2018-07-10 ENCOUNTER — Emergency Department (HOSPITAL_COMMUNITY): Payer: Self-pay

## 2018-07-10 ENCOUNTER — Emergency Department (HOSPITAL_COMMUNITY)
Admission: EM | Admit: 2018-07-10 | Discharge: 2018-07-10 | Disposition: A | Discharge status: Home / Self Care | Payer: Self-pay | Attending: Emergency Medicine | Admitting: Emergency Medicine

## 2018-07-10 ENCOUNTER — Other Ambulatory Visit: Payer: Self-pay

## 2018-07-10 ENCOUNTER — Other Ambulatory Visit: Payer: Self-pay | Admitting: Oncology

## 2018-07-10 DIAGNOSIS — Z87891 Personal history of nicotine dependence: Secondary | ICD-10-CM | POA: Insufficient documentation

## 2018-07-10 DIAGNOSIS — E119 Type 2 diabetes mellitus without complications: Secondary | ICD-10-CM | POA: Insufficient documentation

## 2018-07-10 DIAGNOSIS — I10 Essential (primary) hypertension: Secondary | ICD-10-CM | POA: Insufficient documentation

## 2018-07-10 DIAGNOSIS — Z79899 Other long term (current) drug therapy: Secondary | ICD-10-CM | POA: Insufficient documentation

## 2018-07-10 DIAGNOSIS — R918 Other nonspecific abnormal finding of lung field: Secondary | ICD-10-CM | POA: Insufficient documentation

## 2018-07-10 DIAGNOSIS — E785 Hyperlipidemia, unspecified: Secondary | ICD-10-CM | POA: Insufficient documentation

## 2018-07-10 LAB — CBC WITH DIFFERENTIAL/PLATELET
ABS IMMATURE GRANULOCYTES: 0.21 10*3/uL — AB (ref 0.00–0.07)
Basophils Absolute: 0.1 10*3/uL (ref 0.0–0.1)
Basophils Relative: 1 %
EOS ABS: 0.8 10*3/uL — AB (ref 0.0–0.5)
Eosinophils Relative: 4 %
HEMATOCRIT: 45.8 % (ref 39.0–52.0)
HEMOGLOBIN: 14.8 g/dL (ref 13.0–17.0)
IMMATURE GRANULOCYTES: 1 %
LYMPHS ABS: 1.3 10*3/uL (ref 0.7–4.0)
LYMPHS PCT: 6 %
MCH: 27.4 pg (ref 26.0–34.0)
MCHC: 32.3 g/dL (ref 30.0–36.0)
MCV: 84.7 fL (ref 80.0–100.0)
Monocytes Absolute: 1.1 10*3/uL — ABNORMAL HIGH (ref 0.1–1.0)
Monocytes Relative: 5 %
NEUTROS ABS: 19.2 10*3/uL — AB (ref 1.7–7.7)
NEUTROS PCT: 83 %
Platelets: 379 10*3/uL (ref 150–400)
RBC: 5.41 MIL/uL (ref 4.22–5.81)
RDW: 13.3 % (ref 11.5–15.5)
WBC: 22.8 10*3/uL — ABNORMAL HIGH (ref 4.0–10.5)
nRBC: 0 % (ref 0.0–0.2)

## 2018-07-10 LAB — I-STAT CHEM 8, ED
BUN: 12 mg/dL (ref 6–20)
CHLORIDE: 99 mmol/L (ref 98–111)
CREATININE: 1 mg/dL (ref 0.61–1.24)
Calcium, Ion: 1.56 mmol/L (ref 1.15–1.40)
Glucose, Bld: 201 mg/dL — ABNORMAL HIGH (ref 70–99)
HCT: 45 % (ref 39.0–52.0)
Hemoglobin: 15.3 g/dL (ref 13.0–17.0)
POTASSIUM: 2.9 mmol/L — AB (ref 3.5–5.1)
Sodium: 137 mmol/L (ref 135–145)
TCO2: 28 mmol/L (ref 22–32)

## 2018-07-10 LAB — COMPREHENSIVE METABOLIC PANEL
ALK PHOS: 103 U/L (ref 38–126)
ALT: 10 U/L (ref 0–44)
ANION GAP: 11 (ref 5–15)
AST: 9 U/L — ABNORMAL LOW (ref 15–41)
Albumin: 3.2 g/dL — ABNORMAL LOW (ref 3.5–5.0)
BUN: 14 mg/dL (ref 6–20)
CO2: 28 mmol/L (ref 22–32)
Calcium: 11.6 mg/dL — ABNORMAL HIGH (ref 8.9–10.3)
Chloride: 100 mmol/L (ref 98–111)
Creatinine, Ser: 1.06 mg/dL (ref 0.61–1.24)
GFR calc Af Amer: >60 mL/min (ref >60)
GLUCOSE: 201 mg/dL — AB (ref 70–99)
Potassium: 2.9 mmol/L — ABNORMAL LOW (ref 3.5–5.1)
Sodium: 139 mmol/L (ref 135–145)
TOTAL PROTEIN: 6.6 g/dL (ref 6.5–8.1)
Total Bilirubin: 1.2 mg/dL (ref 0.3–1.2)

## 2018-07-10 MED ORDER — IOPAMIDOL (ISOVUE-370) INJECTION 76%
INTRAVENOUS | Status: AC
  Administered 2018-07-10: 14:00:00
  Filled 2018-07-10: qty 100

## 2018-07-10 MED ORDER — POTASSIUM CHLORIDE CRYS ER 20 MEQ PO TBCR
40.0000 meq | EXTENDED_RELEASE_TABLET | Freq: Once | ORAL | Status: AC
  Administered 2018-07-10: 40 meq via ORAL
  Filled 2018-07-10: qty 2

## 2018-07-10 MED ORDER — IOPAMIDOL (ISOVUE-370) INJECTION 76%
100.0000 mL | Freq: Once | INTRAVENOUS | Status: AC | PRN
  Administered 2018-07-10: 100 mL via INTRAVENOUS

## 2018-07-10 MED ORDER — SODIUM CHLORIDE (PF) 0.9 % IJ SOLN
INTRAMUSCULAR | Status: AC
  Administered 2018-07-10: 14:00:00
  Filled 2018-07-10: qty 50

## 2018-07-10 NOTE — ED Notes (Signed)
EDP [place] notified of POC CHEM8 iCa of 1.56

## 2018-07-10 NOTE — ED Provider Notes (Signed)
[facility] DEPT Provider Note   CSN: [id number] Arrival date & time: 07/10/18  1055     History   Chief Complaint Chief Complaint  Patient presents with  . Rib Cage Pain    L    HPI Dylan Jacobs is a 56 y.o. male.  57 year old male with prior medical history as detailed below presents for evaluation of persistent left sided rib pain.  Symptoms have been ongoing for at least the last 6 months.  Patient reports prior history of rib fracture.  He reports that he assumed that he had fractured a rib however his pain is failed to resolve.  He denies associated fever, cough, nausea, vomiting, or other acute complaint.  Patient denies any specific inciting injury.  Patient does not have routine regular primary care.  The history is provided by the patient and medical records.  Chest Pain   This is a chronic problem. The current episode started more than 1 week ago. The problem occurs daily. The problem has not changed since onset.The pain is associated with movement. Pain location: left lateral chest wall. The pain is mild. The pain does not radiate.    Past Medical History:  Diagnosis Date  . Diabetes mellitus without complication ([place])   . Fractured rib   . Hypertension   . Stroke ([place])   . Ulcer     Patient Active Problem List   Diagnosis Date Noted  . Lung nodule 05/30/2014  . DM (diabetes mellitus) ([place]) 12/29/2011  . HTN (hypertension) 12/29/2011  . Hyperlipidemia 12/29/2011    History reviewed. No pertinent surgical history.      Home Medications    Prior to Admission medications   Medication Sig Start Date End Date Taking? Authorizing Provider  ibuprofen (ADVIL,MOTRIN) 200 MG tablet Take 800 mg by mouth every 4 (four) hours as needed for mild pain.   Yes [provider]  atorvastatin (LIPITOR) 40 MG tablet Take 1 tablet (40 mg total) by mouth daily. Patient not taking: Reported on 07/10/2018 11/11/15   Noe Gens,  PA-C  doxycycline (VIBRA-TABS) 100 MG tablet Take 1 tablet (100 mg total) by mouth 2 (two) times daily. Patient not taking: Reported on 07/10/2018 05/30/14   Darlyne Russian, MD  glimepiride (AMARYL) 4 MG tablet Take 1 tablet (4 mg total) by mouth daily. Patient not taking: Reported on 07/10/2018 05/30/14   Darlyne Russian, MD  hydrochlorothiazide (HYDRODIURIL) 25 MG tablet Take 1 tablet (25 mg total) by mouth daily. Patient not taking: Reported on 07/10/2018 11/11/15   Noe Gens, PA-C  ibuprofen (ADVIL,MOTRIN) 800 MG tablet Take 1 tablet (800 mg total) by mouth every 8 (eight) hours as needed. Patient not taking: Reported on 07/10/2018 05/29/14   Dalia Heading, PA-C  metFORMIN (GLUCOPHAGE) 1000 MG tablet Take 1 tablet (1,000 mg total) by mouth 2 (two) times daily with a meal. PATIENT NEEDS OFFICE VISIT FOR ADDITIONAL REFILLS Patient not taking: Reported on 07/10/2018 11/11/15   Noe Gens, PA-C  metoprolol (LOPRESSOR) 50 MG tablet Take 1 tablet (50 mg total) by mouth 2 (two) times daily. PATIENT NEEDS OFFICE VISIT FOR ADDITIONAL REFILLS Patient not taking: Reported on 07/10/2018 03/21/15   Jaynee Eagles, PA-C  oxyCODONE-acetaminophen (PERCOCET/ROXICET) 5-325 MG per tablet Take 1 tablet by mouth every 6 (six) hours as needed for severe pain. Patient not taking: Reported on 07/10/2018 05/30/14   Darlyne Russian, MD    Family History Family History  Problem Relation Age  of Onset  . Prostate cancer Father   . Cancer Brother   . ALS Brother     Social History Social History   Tobacco Use  . Smoking status: Former Smoker    Packs/day: 2.00    Years: 30.00    Pack years: 60.00    Types: Cigarettes    Last attempt to quit: 12/31/2009    Years since quitting: 8.5  Substance Use Topics  . Alcohol use: Yes  . Drug use: Yes    Types: Marijuana     Allergies   Patient has no known allergies.   Review of Systems Review of Systems  Cardiovascular: Positive for chest pain.    All other systems reviewed and are negative.    Physical Exam Updated Vital Signs BP (!) 189/88   Pulse 99   Temp (!) 97.5 F (36.4 C) (Oral)   Resp 16   Ht 5\' 9"  (1.753 m)   Wt 88.5 kg   SpO2 95%   BMI 28.80 kg/m   Physical Exam  Constitutional: He is oriented to person, place, and time. He appears well-developed and well-nourished. No distress.  HENT:  Head: Normocephalic and atraumatic.  Mouth/Throat: Oropharynx is clear and moist.  Eyes: Pupils are equal, round, and reactive to light. Conjunctivae and EOM are normal.  Neck: Normal range of motion. Neck supple.  Cardiovascular: Normal rate, regular rhythm and normal heart sounds.  Mild tenderness over the left lateral ribs.  This appears to reproduce the patient's reported pain.  Pulmonary/Chest: Effort normal and breath sounds normal. No respiratory distress.  Abdominal: Soft. He exhibits no distension. There is no tenderness.  Musculoskeletal: Normal range of motion. He exhibits no edema or deformity.  Neurological: He is alert and oriented to person, place, and time.  Skin: Skin is warm and dry.  Psychiatric: He has a normal mood and affect.  Nursing note and vitals reviewed.    ED Treatments / Results  Labs (all labs ordered are listed, but only abnormal results are displayed) Labs Reviewed  COMPREHENSIVE METABOLIC PANEL - Abnormal; Notable for the following components:      Result Value   Potassium 2.9 (*)    Glucose, Bld 201 (*)    Calcium 11.6 (*)    Albumin 3.2 (*)    AST 9 (*)    All other components within normal limits  CBC WITH DIFFERENTIAL/PLATELET - Abnormal; Notable for the following components:   WBC 22.8 (*)    Neutro Abs 19.2 (*)    Monocytes Absolute 1.1 (*)    Eosinophils Absolute 0.8 (*)    Abs Immature Granulocytes 0.21 (*)    All other components within normal limits  I-STAT CHEM 8, ED - Abnormal; Notable for the following components:   Potassium 2.9 (*)    Glucose, Bld 201 (*)     Calcium, Ion 1.56 (*)    All other components within normal limits    EKG EKG Interpretation  Date/Time:  Friday July 10 2018 11:47:15 EST Ventricular Rate:  91 PR Interval:    QRS Duration: 99 QT Interval:  328 QTC Calculation: 404 R Axis:   8 Text Interpretation:  Sinus rhythm Atrial premature complex Baseline wander in lead(s) II III aVF Confirmed by Dene Gentry [phone]) on 07/10/2018 11:52:15 AM   Radiology Dg Ribs Unilateral W/chest Left  Result Date: 07/10/2018 CLINICAL DATA:  Left rib pain. EXAM: LEFT RIBS AND CHEST - 3+ VIEW COMPARISON:  Radiographs of May 30, 2014. FINDINGS: There  appears to be lytic destruction involving the lateral portions of the left sixth and seventh ribs with overlying large soft tissue mass concerning for malignancy. Multiple old left rib fractures are noted as well. No pneumothorax is noted. Probable small pleural effusion is noted. Right lung is clear. IMPRESSION: Lytic destruction is seen involving the left sixth and seventh ribs with overlying large soft tissue mass concerning for malignancy. Small left pleural effusion is noted. CT scan of the chest with contrast administration is recommended for further evaluation. Electronically Signed   By: Marijo Conception, M.D.   On: 07/10/2018 12:44   Ct Angio Chest Pe W And/or Wo Contrast  Result Date: 07/10/2018 CLINICAL DATA:  Abnormal chest radiograph aggressive lesion LEFT hemithorax EXAM: CT ANGIOGRAPHY CHEST WITH CONTRAST TECHNIQUE: Multidetector CT imaging of the chest was performed using the standard protocol during bolus administration of intravenous contrast. Multiplanar CT image reconstructions and MIPs were obtained to evaluate the vascular anatomy. CONTRAST:  151mL ISOVUE-370 IOPAMIDOL (ISOVUE-370) INJECTION 76% COMPARISON:  CHEST RADIOGRAPH 06/30/2018 Coronary artery calcification and aortic atherosclerotic calcification. FINDINGS: Cardiovascular: Coronary artery calcification and aortic  atherosclerotic calcification. Mediastinum/Nodes: NO AXILLARY OR SUPRACLAVICULAR. NO MEDIASTINAL ADENOPATHY. PERICARDIAL EFFUSION. Lungs/Pleura: Large round mass is centered in the LEFT lower lobe along the lateral chest wall measures 10.0 x 10.5 x 10.1 cm. Mass extends through the LEFT lateral chest wall to the line musculature. There is erosive destructive change involving multiple LEFT lateral ribs including the fourth fifth, sixth seventh and eighth ribs. Several ribs are near completely destroyed through this section involvement. No additional nodularity in the LEFT lung or RIGHT lung. NO ADDITIONAL PULMONARY NODULARITY. Upper Abdomen: Adrenal glands are partially imaged and appears normal. Number partially imaged. Musculoskeletal: No additional aggressive osseous lesion of than the destructive lesion change in the LEFT lateral ribs. Review of the MIP images confirms the above findings. IMPRESSION: 1. Large round mass in the LEFT lung which extends through the chest wall to the underlying musculature and has destructive features involving multiple LEFT lateral ribs. Differential includes aggressive bronchogenic carcinoma with central necrosis versus aggressive pulmonary infection with rib destruction. Alternatively findings could represent bronchogenic carcinoma with superimposed necrotizing pneumonia. Favor at least some component of bronchogenic carcinoma. 2. No mediastinal lymphadenopathy. 3. Upper abdomen normal. Electronically Signed   By: Suzy Bouchard M.D.   On: 07/10/2018 14:54    Procedures Procedures (including critical care time)  Medications Ordered in ED Medications  potassium chloride SA (K-DUR,KLOR-CON) CR tablet 40 mEq (40 mEq Oral Given 07/10/18 1332)  iopamidol (ISOVUE-370) 76 % injection 100 mL (100 mLs Intravenous Contrast Given 07/10/18 1409)  sodium chloride (PF) 0.9 % injection (  Given by Other 07/10/18 1422)  iopamidol (ISOVUE-370) 76 % injection (  Contrast Given 07/10/18  1422)     Initial Impression / Assessment and Plan / ED Course  I have reviewed the triage vital signs and the nursing notes.  Pertinent labs & imaging results that were available during my care of the patient were reviewed by me and considered in my medical decision making (see chart for details).     MDM  Screen complete  She is presenting for evaluation of left-sided chest wall pain.  This appears to be a chronic complaint.  Work-up today reveals left-sided lung mass with involvement of the left ribs. 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   Patient does not have a prior history of malignancy.    Patient declines admission at this time for further work-up  and treatment.    Case discussed with oncology - Dr. Jana Hakim -who will follow the patient up as an outpatient (early next week).  Patient understands the need for close follow-up.  Patient understands that if he fails to follow-up appropriately no specific diagnosis and no specific treatment plan can be formulated.  Strict return precautions given and understood. Importance of close follow up with oncology stressed repeatedly. Brother of patient present in room during discussion regarding need for follow up.   Final Clinical Impressions(s) / ED Diagnoses   Final diagnoses:  Lung mass    ED Discharge Orders    None       Valarie Merino, MD 07/10/18 1551

## 2018-07-10 NOTE — ED Triage Notes (Signed)
Pt reports that he broke 3 ribs on his L side 20 years ago. 10 years ago, he broke 5 on the same side. He reports that 3 months ago, he started coughing and has been experiencing similar symptoms to when he broke them. He states that the pain comes and goes, but he is here because he has been taking 4 ibuprofen every 4 hours over the last few months every day and he feels that he is going to injure his kidneys. A&Ox4. Ambulatory.

## 2018-07-10 NOTE — Discharge Instructions (Addendum)
Please return for any problem.  Follow-up with your regular doctor as instructed.  Your work-up today revealed a left lung mass. This could be cancer. It is imperative that you follow up with Dr. Jana Hakim (oncology) as directed early next week.

## 2018-07-11 ENCOUNTER — Other Ambulatory Visit: Payer: Self-pay | Admitting: Oncology

## 2018-07-11 DIAGNOSIS — C3492 Malignant neoplasm of unspecified part of left bronchus or lung: Secondary | ICD-10-CM | POA: Insufficient documentation

## 2018-07-12 ENCOUNTER — Other Ambulatory Visit: Payer: Self-pay | Admitting: Oncology

## 2018-07-14 ENCOUNTER — Other Ambulatory Visit: Payer: Self-pay | Admitting: Radiology

## 2018-07-15 ENCOUNTER — Other Ambulatory Visit: Payer: Self-pay | Admitting: Radiology

## 2018-07-17 ENCOUNTER — Ambulatory Visit (HOSPITAL_COMMUNITY)
Admission: RE | Admit: 2018-07-17 | Discharge: 2018-07-17 | Disposition: A | Discharge status: Home / Self Care | Payer: Self-pay | Source: Ambulatory Visit | Attending: Oncology | Admitting: Oncology

## 2018-07-17 ENCOUNTER — Ambulatory Visit (HOSPITAL_COMMUNITY)
Admission: RE | Admit: 2018-07-17 | Discharge: 2018-07-17 | Disposition: A | Discharge status: Home / Self Care | Payer: Self-pay | Source: Ambulatory Visit | Attending: Interventional Radiology | Admitting: Interventional Radiology

## 2018-07-17 ENCOUNTER — Encounter (HOSPITAL_COMMUNITY): Payer: Self-pay

## 2018-07-17 ENCOUNTER — Other Ambulatory Visit: Payer: Self-pay

## 2018-07-17 DIAGNOSIS — I10 Essential (primary) hypertension: Secondary | ICD-10-CM | POA: Insufficient documentation

## 2018-07-17 DIAGNOSIS — I700 Atherosclerosis of aorta: Secondary | ICD-10-CM | POA: Insufficient documentation

## 2018-07-17 DIAGNOSIS — E119 Type 2 diabetes mellitus without complications: Secondary | ICD-10-CM | POA: Insufficient documentation

## 2018-07-17 DIAGNOSIS — Z8673 Personal history of transient ischemic attack (TIA), and cerebral infarction without residual deficits: Secondary | ICD-10-CM | POA: Insufficient documentation

## 2018-07-17 DIAGNOSIS — Z87891 Personal history of nicotine dependence: Secondary | ICD-10-CM | POA: Insufficient documentation

## 2018-07-17 DIAGNOSIS — R222 Localized swelling, mass and lump, trunk: Secondary | ICD-10-CM | POA: Insufficient documentation

## 2018-07-17 DIAGNOSIS — J984 Other disorders of lung: Secondary | ICD-10-CM | POA: Insufficient documentation

## 2018-07-17 DIAGNOSIS — C3492 Malignant neoplasm of unspecified part of left bronchus or lung: Secondary | ICD-10-CM | POA: Insufficient documentation

## 2018-07-17 LAB — CBC WITH DIFFERENTIAL/PLATELET
ABS IMMATURE GRANULOCYTES: 0.18 10*3/uL — AB (ref 0.00–0.07)
BASOS PCT: 0 %
Basophils Absolute: 0.1 10*3/uL (ref 0.0–0.1)
Eosinophils Absolute: 0.6 10*3/uL — ABNORMAL HIGH (ref 0.0–0.5)
Eosinophils Relative: 3 %
HCT: 49 % (ref 39.0–52.0)
Hemoglobin: 15.4 g/dL (ref 13.0–17.0)
IMMATURE GRANULOCYTES: 1 %
Lymphocytes Relative: 5 %
Lymphs Abs: 1.2 10*3/uL (ref 0.7–4.0)
MCH: 26.6 pg (ref 26.0–34.0)
MCHC: 31.4 g/dL (ref 30.0–36.0)
MCV: 84.8 fL (ref 80.0–100.0)
MONO ABS: 0.9 10*3/uL (ref 0.1–1.0)
Monocytes Relative: 4 %
NEUTROS ABS: 20.1 10*3/uL — AB (ref 1.7–7.7)
NEUTROS PCT: 87 %
NRBC: 0 % (ref 0.0–0.2)
PLATELETS: 439 10*3/uL — AB (ref 150–400)
RBC: 5.78 MIL/uL (ref 4.22–5.81)
RDW: 13.7 % (ref 11.5–15.5)
WBC: 23.2 10*3/uL — AB (ref 4.0–10.5)

## 2018-07-17 LAB — BASIC METABOLIC PANEL
ANION GAP: 12 (ref 5–15)
BUN: 10 mg/dL (ref 6–20)
CO2: 30 mmol/L (ref 22–32)
Calcium: 12.6 mg/dL — ABNORMAL HIGH (ref 8.9–10.3)
Chloride: 95 mmol/L — ABNORMAL LOW (ref 98–111)
Creatinine, Ser: 1.02 mg/dL (ref 0.61–1.24)
Glucose, Bld: 205 mg/dL — ABNORMAL HIGH (ref 70–99)
POTASSIUM: 3 mmol/L — AB (ref 3.5–5.1)
SODIUM: 137 mmol/L (ref 135–145)

## 2018-07-17 LAB — PROTIME-INR
INR: 0.99
PROTHROMBIN TIME: 12.9 s (ref 11.4–15.2)

## 2018-07-17 LAB — GLUCOSE, CAPILLARY
Glucose-Capillary: 193 mg/dL — ABNORMAL HIGH (ref 70–99)
Glucose-Capillary: 189 mg/dL — ABNORMAL HIGH (ref 70–99)

## 2018-07-17 MED ORDER — FENTANYL CITRATE (PF) 100 MCG/2ML IJ SOLN
INTRAMUSCULAR | Status: AC | PRN
  Administered 2018-07-17 (×3): 50 ug via INTRAVENOUS

## 2018-07-17 MED ORDER — MIDAZOLAM HCL 2 MG/2ML IJ SOLN
INTRAMUSCULAR | Status: AC | PRN
  Administered 2018-07-17 (×3): 1 mg via INTRAVENOUS

## 2018-07-17 MED ORDER — LIDOCAINE HCL 1 % IJ SOLN
INTRAMUSCULAR | Status: AC
  Filled 2018-07-17: qty 20

## 2018-07-17 MED ORDER — SODIUM CHLORIDE 0.9 % IV SOLN
INTRAVENOUS | Status: DC
  Administered 2018-07-17: 10:00:00 via INTRAVENOUS

## 2018-07-17 MED ORDER — MIDAZOLAM HCL 2 MG/2ML IJ SOLN
INTRAMUSCULAR | Status: AC
  Filled 2018-07-17: qty 4

## 2018-07-17 MED ORDER — FENTANYL CITRATE (PF) 100 MCG/2ML IJ SOLN
INTRAMUSCULAR | Status: AC
  Filled 2018-07-17: qty 4

## 2018-07-17 NOTE — Discharge Instructions (Signed)
Needle Biopsy, Care After These instructions give you information about caring for yourself after your procedure. Your doctor may also give you more specific instructions. Call your doctor if you have any problems or questions after your procedure. Follow these instructions at home:  Rest as told by your doctor.  Take medicines only as told by your doctor.  There are many different ways to close and cover the biopsy site, including stitches (sutures), skin glue, and adhesive strips. Follow instructions from your doctor about: ? How to take care of your biopsy site. ? When and how you should change your bandage (dressing). ? When you should remove your dressing. ? Removing whatever was used to close your biopsy site.  Check your biopsy site every day for signs of infection. Watch for: ? Redness, swelling, or pain. ? Fluid, blood, or pus. Contact a doctor if:  You have a fever.  You have redness, swelling, or pain at the biopsy site, and it lasts longer than a few days.  You have fluid, blood, or pus coming from the biopsy site.  You feel sick to your stomach (nauseous).  You throw up (vomit). Get help right away if:  You are short of breath.  You have trouble breathing.  Your chest hurts.  You feel dizzy or you pass out (faint).  You have bleeding that does not stop with pressure or a bandage.  You cough up blood.  Your belly (abdomen) hurts. This information is not intended to replace advice given to you by your health care provider. Make sure you discuss any questions you have with your health care provider. Document Released: 07/18/2008 Document Revised: 01/11/2016 Document Reviewed: 08/01/2014 Elsevier Interactive Patient Education  [employer].

## 2018-07-17 NOTE — Sedation Documentation (Signed)
Patient denies pain and is resting comfortably.  

## 2018-07-17 NOTE — H&P (Signed)
Chief Complaint: Patient was seen in consultation today for left chest wall mass biopsy at the request of Magrinat,Gustav C  Referring Physician(s): Chauncey Cruel  Supervising Physician: Daryll Brod  Patient Status: [facility] - Out-pt  History of Present Illness: Dylan Jacobs is a 57 y.o. male   Left chest wall pain for months ++ smoker To ED 11/22 for evaluation CT:  IMPRESSION: 1. Large round mass in the LEFT lung which extends through the chest wall to the underlying musculature and has destructive features involving multiple LEFT lateral ribs. Differential includes aggressive bronchogenic carcinoma with central necrosis versus aggressive pulmonary infection with rib destruction. Alternatively findings could represent bronchogenic carcinoma with superimposed necrotizing pneumonia. Favor at least some component of bronchogenic carcinoma. 2. No mediastinal lymphadenopathy. 3. Upper abdomen normal.  Scheduled now for left chest wall mass bx  Past Medical History:  Diagnosis Date  . Diabetes mellitus without complication ([place])   . Fractured rib   . Hypertension   . Stroke ([place])   . Ulcer     History reviewed. No pertinent surgical history.  Allergies: Patient has no known allergies.  Medications: Prior to Admission medications   Medication Sig Start Date End Date Taking? Authorizing Provider  atorvastatin (LIPITOR) 40 MG tablet Take 1 tablet (40 mg total) by mouth daily. Patient not taking: Reported on 07/10/2018 11/11/15   Noe Gens, PA-C  doxycycline (VIBRA-TABS) 100 MG tablet Take 1 tablet (100 mg total) by mouth 2 (two) times daily. Patient not taking: Reported on 07/10/2018 05/30/14   Darlyne Russian, MD  glimepiride (AMARYL) 4 MG tablet Take 1 tablet (4 mg total) by mouth daily. Patient not taking: Reported on 07/10/2018 05/30/14   Darlyne Russian, MD  hydrochlorothiazide (HYDRODIURIL) 25 MG tablet Take 1 tablet (25 mg total) by mouth  daily. Patient not taking: Reported on 07/10/2018 11/11/15   Noe Gens, PA-C  ibuprofen (ADVIL,MOTRIN) 200 MG tablet Take 800 mg by mouth every 4 (four) hours as needed for mild pain.    [provider]  ibuprofen (ADVIL,MOTRIN) 800 MG tablet Take 1 tablet (800 mg total) by mouth every 8 (eight) hours as needed. Patient not taking: Reported on 07/10/2018 05/29/14   Dalia Heading, PA-C  metFORMIN (GLUCOPHAGE) 1000 MG tablet Take 1 tablet (1,000 mg total) by mouth 2 (two) times daily with a meal. PATIENT NEEDS OFFICE VISIT FOR ADDITIONAL REFILLS Patient not taking: Reported on 07/10/2018 11/11/15   Noe Gens, PA-C  metoprolol (LOPRESSOR) 50 MG tablet Take 1 tablet (50 mg total) by mouth 2 (two) times daily. PATIENT NEEDS OFFICE VISIT FOR ADDITIONAL REFILLS Patient not taking: Reported on 07/10/2018 03/21/15   Jaynee Eagles, PA-C  oxyCODONE-acetaminophen (PERCOCET/ROXICET) 5-325 MG per tablet Take 1 tablet by mouth every 6 (six) hours as needed for severe pain. Patient not taking: Reported on 07/10/2018 05/30/14   Darlyne Russian, MD     Family History  Problem Relation Age of Onset  . Prostate cancer Father   . Cancer Brother   . ALS Brother     Social History   Socioeconomic History  . Marital status: Single    Spouse name: Not on file  . Number of children: Not on file  . Years of education: Not on file  . Highest education level: Not on file  Occupational History  . Not on file  Social Needs  . Financial resource strain: Not on file  . Food insecurity:    Worry: Not on  file    Inability: Not on file  . Transportation needs:    Medical: Not on file    Non-medical: Not on file  Tobacco Use  . Smoking status: Former Smoker    Packs/day: 2.00    Years: 30.00    Pack years: 60.00    Types: Cigarettes    Last attempt to quit: 12/31/2009    Years since quitting: 8.5  Substance and Sexual Activity  . Alcohol use: Yes  . Drug use: Yes    Types: Marijuana  .  Sexual activity: Never  Lifestyle  . Physical activity:    Days per week: Not on file    Minutes per session: Not on file  . Stress: Not on file  Relationships  . Social connections:    Talks on phone: Not on file    Gets together: Not on file    Attends religious service: Not on file    Active member of club or organization: Not on file    Attends meetings of clubs or organizations: Not on file    Relationship status: Not on file  Other Topics Concern  . Not on file  Social History Narrative  . Not on file    Review of Systems: A 12 point ROS discussed and pertinent positives are indicated in the HPI above.  All other systems are negative.  Review of Systems  Constitutional: Positive for activity change and appetite change. Negative for fever.  Respiratory: Positive for cough and shortness of breath.   Cardiovascular: Positive for chest pain.  Gastrointestinal: Negative for abdominal pain.  Neurological: Negative for weakness.  Psychiatric/Behavioral: Negative for behavioral problems and confusion.    Vital Signs: BP (!) 196/91   Pulse (!) 103   Temp 99.1 F (37.3 C) (Skin)   Ht 5\' 9"  (1.753 m)   Wt 195 lb (88.5 kg)   SpO2 96%   BMI 28.80 kg/m   Physical Exam  Constitutional: He is oriented to person, place, and time.  Cardiovascular: Normal rate, regular rhythm and normal heart sounds.  Pulmonary/Chest: Effort normal and breath sounds normal.  Shallow breaths secondary left chest wall pain  Abdominal: Soft. Bowel sounds are normal.  Musculoskeletal: Normal range of motion.  Neurological: He is alert and oriented to person, place, and time.  Skin: Skin is warm and dry.  Psychiatric: He has a normal mood and affect. His behavior is normal. Judgment and thought content normal.  Vitals reviewed.   Imaging: Dg Ribs Unilateral W/chest Left  Result Date: 07/10/2018 CLINICAL DATA:  Left rib pain. EXAM: LEFT RIBS AND CHEST - 3+ VIEW COMPARISON:  Radiographs of  May 30, 2014. FINDINGS: There appears to be lytic destruction involving the lateral portions of the left sixth and seventh ribs with overlying large soft tissue mass concerning for malignancy. Multiple old left rib fractures are noted as well. No pneumothorax is noted. Probable small pleural effusion is noted. Right lung is clear. IMPRESSION: Lytic destruction is seen involving the left sixth and seventh ribs with overlying large soft tissue mass concerning for malignancy. Small left pleural effusion is noted. CT scan of the chest with contrast administration is recommended for further evaluation. Electronically Signed   By: Marijo Conception, M.D.   On: 07/10/2018 12:44   Ct Angio Chest Pe W And/or Wo Contrast  Result Date: 07/10/2018 CLINICAL DATA:  Abnormal chest radiograph aggressive lesion LEFT hemithorax EXAM: CT ANGIOGRAPHY CHEST WITH CONTRAST TECHNIQUE: Multidetector CT imaging of the chest was  performed using the standard protocol during bolus administration of intravenous contrast. Multiplanar CT image reconstructions and MIPs were obtained to evaluate the vascular anatomy. CONTRAST:  133mL ISOVUE-370 IOPAMIDOL (ISOVUE-370) INJECTION 76% COMPARISON:  CHEST RADIOGRAPH 06/30/2018 Coronary artery calcification and aortic atherosclerotic calcification. FINDINGS: Cardiovascular: Coronary artery calcification and aortic atherosclerotic calcification. Mediastinum/Nodes: NO AXILLARY OR SUPRACLAVICULAR. NO MEDIASTINAL ADENOPATHY. PERICARDIAL EFFUSION. Lungs/Pleura: Large round mass is centered in the LEFT lower lobe along the lateral chest wall measures 10.0 x 10.5 x 10.1 cm. Mass extends through the LEFT lateral chest wall to the line musculature. There is erosive destructive change involving multiple LEFT lateral ribs including the fourth fifth, sixth seventh and eighth ribs. Several ribs are near completely destroyed through this section involvement. No additional nodularity in the LEFT lung or RIGHT  lung. NO ADDITIONAL PULMONARY NODULARITY. Upper Abdomen: Adrenal glands are partially imaged and appears normal. Number partially imaged. Musculoskeletal: No additional aggressive osseous lesion of than the destructive lesion change in the LEFT lateral ribs. Review of the MIP images confirms the above findings. IMPRESSION: 1. Large round mass in the LEFT lung which extends through the chest wall to the underlying musculature and has destructive features involving multiple LEFT lateral ribs. Differential includes aggressive bronchogenic carcinoma with central necrosis versus aggressive pulmonary infection with rib destruction. Alternatively findings could represent bronchogenic carcinoma with superimposed necrotizing pneumonia. Favor at least some component of bronchogenic carcinoma. 2. No mediastinal lymphadenopathy. 3. Upper abdomen normal. Electronically Signed   By: Suzy Bouchard M.D.   On: 07/10/2018 14:54    Labs:  CBC: Recent Labs    07/10/18 1314 07/10/18 1320  WBC 22.8*  --   HGB 14.8 15.3  HCT 45.8 45.0  PLT 379  --     COAGS: No results for input(s): INR, APTT in the last 8760 hours.  BMP: Recent Labs    07/10/18 1314 07/10/18 1320  NA 139 137  K 2.9* 2.9*  CL 100 99  CO2 28  --   GLUCOSE 201* 201*  BUN 14 12  CALCIUM 11.6*  --   CREATININE 1.06 1.00  GFRNONAA >60  --   GFRAA >60  --     LIVER FUNCTION TESTS: Recent Labs    07/10/18 1314  BILITOT 1.2  AST 9*  ALT 10  ALKPHOS 103  PROT 6.6  ALBUMIN 3.2*    TUMOR MARKERS: No results for input(s): AFPTM, CEA, CA199, CHROMGRNA in the last 8760 hours.  Assessment and Plan:  Left chest wall mass Chest pain Scheduled for biopsy of same Risks and benefits discussed with the patient including, but not limited to bleeding, hemoptysis, respiratory failure requiring intubation, infection, pneumothorax requiring chest tube placement, stroke from air embolism or even death.  All of the patient's questions were  answered, patient is agreeable to proceed. Consent signed and in chart.  Thank you for this interesting consult.  I greatly enjoyed meeting Dylan Jacobs and look forward to participating in their care.  A copy of this report was sent to the requesting provider on this date.  Electronically Signed: Lavonia Drafts, PA-C 109/22/2019, 9:43 AM   I spent a total of  30 Minutes   in face to face in clinical consultation, greater than 50% of which was counseling/coordinating care for left chest wall mass bx

## 2018-07-17 NOTE — Procedures (Signed)
Large left lung mass  S/p CT bx  No comp Stable ebl min Path pending Full report in pacs

## 2018-07-21 ENCOUNTER — Other Ambulatory Visit: Payer: Self-pay | Admitting: Oncology

## 2018-07-22 ENCOUNTER — Other Ambulatory Visit: Payer: Self-pay | Admitting: Oncology

## 2018-07-23 ENCOUNTER — Telehealth: Payer: Self-pay | Admitting: *Deleted

## 2018-07-23 ENCOUNTER — Encounter: Payer: Self-pay | Admitting: *Deleted

## 2018-07-23 NOTE — Telephone Encounter (Signed)
Oncology Nurse Navigator Documentation  Oncology Nurse Navigator Flowsheets 07/23/2018  Navigator Location [facility]  Referral date to RadOnc/MedOnc 07/21/2018  Navigator Encounter Type Telephone/Dr. Julien Nordmann updated me on referral and when to schedule Mr. Ridgley. I called to schedule but was unable to reach him.  I did leave vm message for him to call with my name and phone number.   Telephone Outgoing Call  Treatment Phase Pre-Tx/Tx Discussion  Barriers/Navigation Needs Education;Coordination of Care  Education Other  Interventions Coordination of Care;Education  Coordination of Care Other  Education Method Verbal  Acuity Level 2  Time Spent with Patient 30

## 2018-07-23 NOTE — Progress Notes (Signed)
Per Dr. Julien Nordmann, PDL 1 requested to be testing on recent pathology.  I updated pathology team.

## 2018-07-23 NOTE — Telephone Encounter (Signed)
Oncology Nurse Navigator Documentation  Oncology Nurse Navigator Flowsheets 07/23/2018  Navigator Location [facility]  Navigator Encounter Type Telephone/I called patient to schedule him to see Dr. Julien Nordmann. I was unable to reach but did leave a vm message with my name and phone number to call.   Telephone Outgoing Call  Treatment Phase Pre-Tx/Tx Discussion  Barriers/Navigation Needs Education  Education Other  Education Method Verbal  Acuity Level 1  Time Spent with Patient 15

## 2018-07-24 ENCOUNTER — Other Ambulatory Visit: Payer: Self-pay | Admitting: *Deleted

## 2018-07-24 ENCOUNTER — Encounter: Payer: Self-pay | Admitting: *Deleted

## 2018-07-24 DIAGNOSIS — C3492 Malignant neoplasm of unspecified part of left bronchus or lung: Secondary | ICD-10-CM

## 2018-07-24 NOTE — Progress Notes (Signed)
Oncology Nurse Navigator Documentation  Oncology Nurse Navigator Flowsheets 07/24/2018  Navigator Location [facility]  Navigator Encounter Type Telephone/Mr. Anastasia called me back today.  I scheduled him to be seen next week. He verbalized understanding of appt time and place.   Telephone Incoming Call  Treatment Phase Pre-Tx/Tx Discussion  Barriers/Navigation Needs Education;Coordination of Care  Education Other  Interventions Coordination of Care;Education  Coordination of Care Appts  Education Method Verbal  Acuity Level 1  Time Spent with Patient 30

## 2018-07-24 NOTE — Progress Notes (Signed)
Patient's case discussed at thoracic cancer conference 07/23/18 and documented for communication purpose.  Patient was not seen or examine at cancer conference.

## 2018-07-31 ENCOUNTER — Telehealth: Payer: Self-pay | Admitting: Internal Medicine

## 2018-07-31 ENCOUNTER — Other Ambulatory Visit: Payer: Self-pay | Admitting: Medical Oncology

## 2018-07-31 ENCOUNTER — Encounter: Payer: Self-pay | Admitting: Internal Medicine

## 2018-07-31 ENCOUNTER — Inpatient Hospital Stay: Payer: Self-pay

## 2018-07-31 ENCOUNTER — Inpatient Hospital Stay: Payer: Self-pay | Attending: Internal Medicine | Admitting: Internal Medicine

## 2018-07-31 DIAGNOSIS — F17210 Nicotine dependence, cigarettes, uncomplicated: Secondary | ICD-10-CM

## 2018-07-31 DIAGNOSIS — Z5111 Encounter for antineoplastic chemotherapy: Secondary | ICD-10-CM | POA: Insufficient documentation

## 2018-07-31 DIAGNOSIS — C3490 Malignant neoplasm of unspecified part of unspecified bronchus or lung: Secondary | ICD-10-CM

## 2018-07-31 DIAGNOSIS — Z800 Family history of malignant neoplasm of digestive organs: Secondary | ICD-10-CM

## 2018-07-31 DIAGNOSIS — Z7189 Other specified counseling: Secondary | ICD-10-CM

## 2018-07-31 DIAGNOSIS — I10 Essential (primary) hypertension: Secondary | ICD-10-CM | POA: Insufficient documentation

## 2018-07-31 DIAGNOSIS — C3492 Malignant neoplasm of unspecified part of left bronchus or lung: Secondary | ICD-10-CM | POA: Insufficient documentation

## 2018-07-31 DIAGNOSIS — C7989 Secondary malignant neoplasm of other specified sites: Secondary | ICD-10-CM

## 2018-07-31 DIAGNOSIS — G893 Neoplasm related pain (acute) (chronic): Secondary | ICD-10-CM

## 2018-07-31 DIAGNOSIS — E119 Type 2 diabetes mellitus without complications: Secondary | ICD-10-CM | POA: Insufficient documentation

## 2018-07-31 DIAGNOSIS — Z87891 Personal history of nicotine dependence: Secondary | ICD-10-CM

## 2018-07-31 DIAGNOSIS — C3432 Malignant neoplasm of lower lobe, left bronchus or lung: Secondary | ICD-10-CM | POA: Insufficient documentation

## 2018-07-31 DIAGNOSIS — Z801 Family history of malignant neoplasm of trachea, bronchus and lung: Secondary | ICD-10-CM

## 2018-07-31 LAB — CBC WITH DIFFERENTIAL (CANCER CENTER ONLY)
Abs Immature Granulocytes: 0.15 10*3/uL — ABNORMAL HIGH (ref 0.00–0.07)
BASOS PCT: 0 %
Basophils Absolute: 0.1 10*3/uL (ref 0.0–0.1)
Eosinophils Absolute: 0.8 10*3/uL — ABNORMAL HIGH (ref 0.0–0.5)
Eosinophils Relative: 3 %
HCT: 46.7 % (ref 39.0–52.0)
Hemoglobin: 15.1 g/dL (ref 13.0–17.0)
Immature Granulocytes: 1 %
Lymphocytes Relative: 5 %
Lymphs Abs: 1.1 10*3/uL (ref 0.7–4.0)
MCH: 26.8 pg (ref 26.0–34.0)
MCHC: 32.3 g/dL (ref 30.0–36.0)
MCV: 82.8 fL (ref 80.0–100.0)
MONOS PCT: 4 %
Monocytes Absolute: 1 10*3/uL (ref 0.1–1.0)
Neutro Abs: 21.4 10*3/uL — ABNORMAL HIGH (ref 1.7–7.7)
Neutrophils Relative %: 87 %
Platelet Count: 383 10*3/uL (ref 150–400)
RBC: 5.64 MIL/uL (ref 4.22–5.81)
RDW: 13.5 % (ref 11.5–15.5)
WBC Count: 24.5 10*3/uL — ABNORMAL HIGH (ref 4.0–10.5)
nRBC: 0 % (ref 0.0–0.2)

## 2018-07-31 LAB — CMP (CANCER CENTER ONLY)
ALT: <6 U/L (ref 0–44)
AST: 6 U/L — AB (ref 15–41)
Albumin: 2.9 g/dL — ABNORMAL LOW (ref 3.5–5.0)
Alkaline Phosphatase: 119 U/L (ref 38–126)
Anion gap: 11 (ref 5–15)
BUN: 9 mg/dL (ref 6–20)
CO2: 32 mmol/L (ref 22–32)
CREATININE: 1.07 mg/dL (ref 0.61–1.24)
Calcium: 13.8 mg/dL (ref 8.9–10.3)
Chloride: 96 mmol/L — ABNORMAL LOW (ref 98–111)
GFR, Est AFR Am: >60 mL/min (ref >60)
Glucose, Bld: 153 mg/dL — ABNORMAL HIGH (ref 70–99)
Potassium: 2.9 mmol/L — CL (ref 3.5–5.1)
Sodium: 139 mmol/L (ref 135–145)
Total Bilirubin: 1.1 mg/dL (ref 0.3–1.2)
Total Protein: 6.7 g/dL (ref 6.5–8.1)

## 2018-07-31 MED ORDER — ZOLEDRONIC ACID 4 MG/100ML IV SOLN
4.0000 mg | Freq: Once | INTRAVENOUS | Status: AC
  Administered 2018-07-31: 4 mg via INTRAVENOUS
  Filled 2018-07-31: qty 100

## 2018-07-31 MED ORDER — SODIUM CHLORIDE 0.9 % IV SOLN
Freq: Once | INTRAVENOUS | Status: AC
  Administered 2018-07-31: 13:00:00 via INTRAVENOUS
  Filled 2018-07-31: qty 250

## 2018-07-31 MED ORDER — PROCHLORPERAZINE MALEATE 10 MG PO TABS: 10.0000 mg | ORAL_TABLET | Freq: Four times a day (QID) | ORAL | 0 refills | Status: DC | PRN

## 2018-07-31 MED ORDER — SODIUM CHLORIDE 0.9 % IV SOLN
INTRAVENOUS | Status: DC
  Filled 2018-07-31: qty 250

## 2018-07-31 MED ORDER — POTASSIUM CHLORIDE ER 20 MEQ PO TBCR: 40.0000 meq | EXTENDED_RELEASE_TABLET | Freq: Every day | ORAL | 0 refills | Status: DC

## 2018-07-31 MED ORDER — ZOLEDRONIC ACID 4 MG/100ML IV SOLN
4.0000 mg | Freq: Once | INTRAVENOUS | Status: DC
  Filled 2018-07-31: qty 100

## 2018-07-31 MED ORDER — OXYCODONE-ACETAMINOPHEN 5-325 MG PO TABS: 1.0000 | ORAL_TABLET | Freq: Four times a day (QID) | ORAL | 0 refills | Status: DC | PRN

## 2018-07-31 NOTE — Progress Notes (Signed)
Medications Reconciliation- pt stated he has not taking any of his  meds "for a while " and states "I did not know I had to take any". HE reports he takes ibuprophen every day.

## 2018-07-31 NOTE — Telephone Encounter (Signed)
Printed Calendar and avs.

## 2018-07-31 NOTE — Progress Notes (Signed)
Pt given education on zometa and signed informed consent.  Verbalized understanding of indications, side effects, and restrictions.  D/C paperwork given to pt.  Tolerated infusion well.  VSS.  Ambulatory w/steady gait.

## 2018-07-31 NOTE — Progress Notes (Signed)
[facility] [facility] Telephone:([phone]) [phone]   Fax:([phone]) [phone]  CONSULT NOTE  REFERRING PHYSICIAN: Dr. Ruben Reason  REASON FOR CONSULTATION:  57 years old white male recently diagnosed with lung cancer.  HPI Dylan Jacobs is a 57 y.o. male with past medical history significant for hypertension, diabetes mellitus, and stroke.  The patient presented to the emergency department on July 10, 2018 complaining of pain in the left rib cage.  During his evaluation he had x-ray of the left ribs and that showed lytic destruction involving the left sixth and seventh ribs with overlying large soft tissue mass concerning for malignancy as well as a small left pleural effusion.  This was followed by CT scan of the chest and that showed large rounded mass centered in the left lower lobe along the lateral chest wall measuring 10.0 x 10.5 x 10.1 cm.  The mass extends through the left lateral chest wall to the line musculature.  There was erosive destructive changes involving multiple left lateral ribs including the fourth, fifth, sixth, seventh and TTF-1 that was patchy eighth ribs.  Several ribs are near completely destroyed through this section involvement.  The findings are consistent with bronchogenic carcinoma.  There was no mediastinal lymphadenopathy.  On July 17, 2018 the patient underwent CT-guided core biopsy of the left chest wall mass.  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  The immunohistochemical stains showed the tumor cells were positive for p63, CK 5/6, pankeratin and TTF-1 (patchy weak).  The cells were negative for Napsin-A, CK7 and CK20 consistent with the above diagnosis. The patient was referred to me today for evaluation and recommendation regarding treatment of his condition. When seen today the patient continues to complain on the left side of the chest as well as upper back.  He lost  around 5 pounds in the last few weeks.  He has some visual changes secondary to cataract but no significant headache.  He has no nausea, vomiting or diarrhea but has constipation.  The patient denied having any significant shortness of breath or hemoptysis. Family history significant for mother with colon cancer in her 62s father also had prostate cancer in his 10s, brother with lung cancer and another brother with ALS. The patient is single and has no children.  He was accompanied today by his brother [name].  He used to work at [employer].  The patient has a history of smoking 1 pack/day for 44 years and unfortunately he continues to smoke.  He has a history of alcohol abuse in the past but not recently.  He has no history of drug abuse.  HPI  Past Medical History:  Diagnosis Date  . Diabetes mellitus without complication ([place])   . Fractured rib   . Hypertension   . Stroke ([place])   . Ulcer     No past surgical history on file.  Family History  Problem Relation Age of Onset  . Prostate cancer Father   . Cancer Brother   . ALS Brother     Social History Social History   Tobacco Use  . Smoking status: Former Smoker    Packs/day: 2.00    Years: 30.00    Pack years: 60.00    Types: Cigarettes    Last attempt to quit: 12/31/2009    Years since quitting: 8.5  Substance Use Topics  . Alcohol use: Yes  . Drug use: Yes  Types: Marijuana    No Known Allergies  Current Outpatient Medications  Medication Sig Dispense Refill  . atorvastatin (LIPITOR) 40 MG tablet Take 1 tablet (40 mg total) by mouth daily. 30 tablet 1  . doxycycline (VIBRA-TABS) 100 MG tablet Take 1 tablet (100 mg total) by mouth 2 (two) times daily. (Patient not taking: Reported on 07/10/2018) 20 tablet 0  . glimepiride (AMARYL) 4 MG tablet Take 1 tablet (4 mg total) by mouth daily. (Patient not taking: Reported on 07/10/2018) 30 tablet 2  . hydrochlorothiazide (HYDRODIURIL) 25 MG tablet Take 1  tablet (25 mg total) by mouth daily. (Patient not taking: Reported on 07/10/2018) 30 tablet 1  . ibuprofen (ADVIL,MOTRIN) 200 MG tablet Take 800 mg by mouth every 4 (four) hours as needed for mild pain.    Marland Kitchen ibuprofen (ADVIL,MOTRIN) 800 MG tablet Take 1 tablet (800 mg total) by mouth every 8 (eight) hours as needed. (Patient not taking: Reported on 07/10/2018) 30 tablet 0  . metFORMIN (GLUCOPHAGE) 1000 MG tablet Take 1 tablet (1,000 mg total) by mouth 2 (two) times daily with a meal. PATIENT NEEDS OFFICE VISIT FOR ADDITIONAL REFILLS (Patient not taking: Reported on 07/10/2018) 60 tablet 0  . metoprolol (LOPRESSOR) 50 MG tablet Take 1 tablet (50 mg total) by mouth 2 (two) times daily. PATIENT NEEDS OFFICE VISIT FOR ADDITIONAL REFILLS (Patient not taking: Reported on 07/10/2018) 60 tablet 0  . oxyCODONE-acetaminophen (PERCOCET/ROXICET) 5-325 MG per tablet Take 1 tablet by mouth every 6 (six) hours as needed for severe pain. (Patient not taking: Reported on 07/10/2018) 25 tablet 0   No current facility-administered medications for this visit.     Review of Systems  Constitutional: positive for anorexia, fatigue and weight loss Eyes: negative Ears, nose, mouth, throat, and face: negative Respiratory: positive for cough and pleurisy/chest pain Cardiovascular: negative Gastrointestinal: negative Genitourinary:negative Integument/breast: negative Hematologic/lymphatic: negative Musculoskeletal:positive for back pain Neurological: negative Behavioral/Psych: negative Endocrine: negative Allergic/Immunologic: negative  Physical Exam  WUJ:WJXBJ, healthy, no distress, well nourished, well developed and anxious SKIN: skin color, texture, turgor are normal, no rashes or significant lesions HEAD: Normocephalic, No masses, lesions, tenderness or abnormalities EYES: normal, PERRLA, Conjunctiva are pink and non-injected EARS: External ears normal, Canals clear OROPHARYNX:no exudate, no erythema and  lips, buccal mucosa, and tongue normal  NECK: supple, no adenopathy, no JVD LYMPH:  no palpable lymphadenopathy, no hepatosplenomegaly LUNGS: decreased breath sounds, expiratory wheezes bilaterally HEART: regular rate & rhythm, no murmurs and no gallops ABDOMEN:abdomen soft, non-tender, normal bowel sounds and no masses or organomegaly BACK: Back symmetric, no curvature., No CVA tenderness EXTREMITIES:no joint deformities, effusion, or inflammation, no edema  NEURO: alert & oriented x 3 with fluent speech, no focal motor/sensory deficits  PERFORMANCE STATUS: ECOG 1  LABORATORY DATA: Lab Results  Component Value Date   WBC 24.5 (H) 07/31/2018   HGB 15.1 07/31/2018   HCT 46.7 07/31/2018   MCV 82.8 07/31/2018   PLT 383 07/31/2018      Chemistry      Component Value Date/Time   NA 139 07/31/2018 1003   K 2.9 (LL) 07/31/2018 1003   CL 96 (L) 07/31/2018 1003   CO2 32 07/31/2018 1003   BUN 9 07/31/2018 1003   CREATININE 1.07 07/31/2018 1003   CREATININE 1.15 05/30/2014 1118      Component Value Date/Time   CALCIUM 13.8 (HH) 07/31/2018 1003   ALKPHOS 119 07/31/2018 1003   AST 6 (L) 07/31/2018 1003   ALT <6 07/31/2018 1003  BILITOT 1.1 07/31/2018 1003       RADIOGRAPHIC STUDIES: Dg Ribs Unilateral W/chest Left  Result Date: 07/10/2018 CLINICAL DATA:  Left rib pain. EXAM: LEFT RIBS AND CHEST - 3+ VIEW COMPARISON:  Radiographs of May 30, 2014. FINDINGS: There appears to be lytic destruction involving the lateral portions of the left sixth and seventh ribs with overlying large soft tissue mass concerning for malignancy. Multiple old left rib fractures are noted as well. No pneumothorax is noted. Probable small pleural effusion is noted. Right lung is clear. IMPRESSION: Lytic destruction is seen involving the left sixth and seventh ribs with overlying large soft tissue mass concerning for malignancy. Small left pleural effusion is noted. CT scan of the chest with contrast  administration is recommended for further evaluation. Electronically Signed   By: Marijo Conception, M.D.   On: 07/10/2018 12:44   Ct Angio Chest Pe W And/or Wo Contrast  Result Date: 07/10/2018 CLINICAL DATA:  Abnormal chest radiograph aggressive lesion LEFT hemithorax EXAM: CT ANGIOGRAPHY CHEST WITH CONTRAST TECHNIQUE: Multidetector CT imaging of the chest was performed using the standard protocol during bolus administration of intravenous contrast. Multiplanar CT image reconstructions and MIPs were obtained to evaluate the vascular anatomy. CONTRAST:  135mL ISOVUE-370 IOPAMIDOL ([id number]) INJECTION 76% COMPARISON:  CHEST RADIOGRAPH 06/30/2018 Coronary artery calcification and aortic atherosclerotic calcification. FINDINGS: Cardiovascular: Coronary artery calcification and aortic atherosclerotic calcification. Mediastinum/Nodes: NO AXILLARY OR SUPRACLAVICULAR. NO MEDIASTINAL ADENOPATHY. PERICARDIAL EFFUSION. Lungs/Pleura: Large round mass is centered in the LEFT lower lobe along the lateral chest wall measures 10.0 x 10.5 x 10.1 cm. Mass extends through the LEFT lateral chest wall to the line musculature. There is erosive destructive change involving multiple LEFT lateral ribs including the fourth fifth, sixth seventh and eighth ribs. Several ribs are near completely destroyed through this section involvement. No additional nodularity in the LEFT lung or RIGHT lung. NO ADDITIONAL PULMONARY NODULARITY. Upper Abdomen: Adrenal glands are partially imaged and appears normal. Number partially imaged. Musculoskeletal: No additional aggressive osseous lesion of than the destructive lesion change in the LEFT lateral ribs. Review of the MIP images confirms the above findings. IMPRESSION: 1. Large round mass in the LEFT lung which extends through the chest wall to the underlying musculature and has destructive features involving multiple LEFT lateral ribs. Differential includes aggressive bronchogenic carcinoma with  central necrosis versus aggressive pulmonary infection with rib destruction. Alternatively findings could represent bronchogenic carcinoma with superimposed necrotizing pneumonia. Favor at least some component of bronchogenic carcinoma. 2. No mediastinal lymphadenopathy. 3. Upper abdomen normal. Electronically Signed   By: Suzy Bouchard M.D.   On: 07/10/2018 14:54   Ct Biopsy  Result Date: 119-Mar-202019 INDICATION: Large left lung mass invading the left chest wall EXAM: CT BIOPSY LEFT LUNG MASS MEDICATIONS: 1% LIDOCAINE LOCAL ANESTHESIA/SEDATION: Moderate (conscious) sedation was employed during this procedure. A total of Versed 3.0 mg and Fentanyl 150 mcg was administered intravenously. Moderate Sedation Time: 15 minutes. The patient's level of consciousness and vital signs were monitored continuously by radiology nursing throughout the procedure under my direct supervision. FLUOROSCOPY TIME:  Fluoroscopy Time: NONE. COMPLICATIONS: None immediate. PROCEDURE: Informed written consent was obtained from the patient after a thorough discussion of the procedural risks, benefits and alternatives. All questions were addressed. Maximal Sterile Barrier Technique was utilized including caps, mask, sterile gowns, sterile gloves, sterile drape, hand hygiene and skin antiseptic. A timeout was performed prior to the initiation of the procedure. Previous imaging reviewed. Patient positioned supine. Noncontrast localization CT performed.  The large left chest wall mass invading the adjacent ribs was localized. Overlying skin marked for a lateral approach. Under sterile conditions and local anesthesia, a 17 gauge 6.8 cm coaxial guide needle was advanced to the periphery of the left lung mass. Needle position confirmed with CT. 18 gauge core biopsies obtained. Samples were small and fragmented and placed in formalin. Needle tract occluded with the bio sentry device. Postprocedure imaging demonstrates no hemorrhage or hematoma.  Negative for pneumothorax. Patient tolerated the biopsy well. IMPRESSION: Successful CT-guided left invasive lung mass 18 gauge core biopsy Electronically Signed   By: Jerilynn Mages.  Shick M.D.   On: 102-03-202019 12:33   Dg Chest Port 1 View  Result Date: 102-03-202019 CLINICAL DATA:  Left chest mass.  Status post biopsy. EXAM: PORTABLE CHEST 1 VIEW COMPARISON:  Chest x-ray and chest CT dated 07/10/2018 FINDINGS: Again noted is the large mass in the left hemithorax with multiple adjacent destroyed left ribs. Small left effusion, unchanged. No pneumothorax after lung biopsy. Heart size and vascularity are normal.  Right lung is clear. IMPRESSION: No acute abnormalities. No pneumothorax after biopsy. Stable left lung mass and left rib destruction. Electronically Signed   By: Lorriane Shire M.D.   On: 102-03-202019 13:53    ASSESSMENT: This is a very pleasant 57 years old white male recently diagnosed with a stage IIIa (T4, N0, M0) non-small cell lung cancer, squamous cell carcinoma with sarcomatoid features presented with large left lower lobe lung mass with invasion of the left chest wall and musculatures as well as destruction of multiple left ribs diagnosed in December 2019. The patient also has hypercalcemia of malignancy.   PLAN: I had a lengthy discussion with the patient and his brother today about his current disease stage, prognosis and treatment options. I personally and independently reviewed the scan images and discussed the result and showed the images to the patient and his brother. I recommended for the patient to complete the staging work-up by ordering a PET scan as well as MRI of the brain to rule out any other metastatic disease. I recommended for the patient a course of concurrent chemoradiation with weekly carboplatin for AUC of 2 and paclitaxel 45 mg/M2.  This will be followed by either consolidation immunotherapy with Imfinzi or if the patient has significant improvement in his disease after the  concurrent chemoradiation he would be considered for reevaluation by surgery but this is less likely. I discussed with the patient the adverse effect of the chemotherapy including but not limited to alopecia, myelosuppression, nausea and vomiting, peripheral neuropathy, liver or renal dysfunction.  I will refer the patient to radiation oncology for evaluation and discussion of the radiotherapy portion of his treatment. For the hypokalemia I will start the patient on potassium chloride 40 mEq p.o. daily for the next 10 days. For the hypercalcemia of malignancy, I will arrange for the patient to receive 1 L of normal saline today and he will also receive Zometa 4 mg IV in the clinic today. He is expected to start the first cycle of his concurrent chemoradiation on August 10, 2018. We will arrange for the patient to have a chemotherapy education class before the first cycle of his treatment. I will also call his pharmacy with prescription for Compazine 10 mg p.o. every 6 hours as needed for nausea. For pain management I gave the patient prescription for Percocet 5/325 mg p.o. every 6 hours as needed for pain. The patient was advised to call immediately if he  has any concerning symptoms in the interval. The patient voices understanding of current disease status and treatment options and is in agreement with the current care plan.  All questions were answered. The patient knows to call the clinic with any problems, questions or concerns. We can certainly see the patient much sooner if necessary.  Thank you so much for allowing me to participate in the care of [place]. I will continue to follow up the patient with you and assist in his care.  I spent 55 minutes counseling the patient face to face. The total time spent in the appointment was 80 minutes.  Disclaimer: This note was dictated with voice recognition software. Similar sounding words can inadvertently be transcribed and may not be  corrected upon review.   Eilleen Kempf July 31, 2018, 11:17 AM

## 2018-07-31 NOTE — Patient Instructions (Signed)

## 2018-07-31 NOTE — Telephone Encounter (Signed)
Per 12/13 los. Scheduled appointments for PET, MRI, CHEMO class and his first lab and chemo treatment. Emailed Mohamed in regards to the follow up visit.  Printed calendar and avs.

## 2018-07-31 NOTE — Progress Notes (Signed)
START ON PATHWAY REGIMEN - Non-Small Cell Lung     Administer weekly:     Paclitaxel      Carboplatin   **Always confirm dose/schedule in your pharmacy ordering system**  Patient Characteristics: Stage III - Unresectable, PS = 0, 1 AJCC T Category: T4 Current Disease Status: No Distant Mets or Local Recurrence AJCC N Category: N0 AJCC M Category: M0 AJCC 8 Stage Grouping: IIIA Performance Status: PS = 0, 1 Intent of Therapy: Curative Intent, Discussed with Patient

## 2018-08-01 DIAGNOSIS — G893 Neoplasm related pain (acute) (chronic): Secondary | ICD-10-CM | POA: Insufficient documentation

## 2018-08-01 DIAGNOSIS — Z452 Encounter for adjustment and management of vascular access device: Secondary | ICD-10-CM | POA: Insufficient documentation

## 2018-08-01 DIAGNOSIS — Z7189 Other specified counseling: Secondary | ICD-10-CM | POA: Insufficient documentation

## 2018-08-03 ENCOUNTER — Inpatient Hospital Stay: Payer: Self-pay

## 2018-08-04 NOTE — Progress Notes (Signed)
Thoracic Location of Tumor / Histology: left chest wall mass.  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  Patient presented with symptoms of: The patient presented to the emergency department on July 10, 2018 complaining of pain in the left rib cage.  During his evaluation he had x-ray of the left ribs and that showed lytic destruction involving the left sixth and seventh ribs with overlying large soft tissue mass concerning for malignancy as well as a small left pleural effusion.  This was followed by CT scan of the chest and that showed large rounded mass centered in the left lower lobe along the lateral chest wall measuring 10.0 x 10.5 x 10.1 cm.  The mass extends through the left lateral chest wall to the line musculature.  There was erosive destructive changes involving multiple left lateral ribs including the fourth, fifth, sixth, seventh and TTF-1 that was patchy eighth ribs.  Several ribs are near completely destroyed through this section involvement.  The findings are consistent with bronchogenic carcinoma.  There was no mediastinal lymphadenopathy.   Biopsies revealed: 07/17/18:  Diagnosis Lung, needle/core biopsy(ies), left - POORLY DIFFERENTIATED SQUAMOUS CELL CARCINOMA WITH SARCOMATOID CHANGES.  Tobacco/Marijuana/Snuff/ETOH use: Tobacco Use  . Smoking status: Former Smoker    Packs/day: 2.00    Years: 30.00    Pack years: 60.00    Types: Cigarettes    Last attempt to quit: 12/31/2009    Years since quitting: 8.5  Substance Use Topics  . Alcohol use: Yes  . Drug use: Yes    Types: Marijuana      Past/Anticipated interventions by cardiothoracic surgery, if any: None at this time.  Past/Anticipated interventions by medical oncology, if any: Per Dr. Julien Nordmann 07/31/18:  I recommended for the patient to complete the staging work-up by ordering a PET scan as well as MRI of the brain to  rule out any other metastatic disease. I recommended for the patient a course of concurrent chemoradiation with weekly carboplatin for AUC of 2 and paclitaxel 45 mg/M2.  This will be followed by either consolidation immunotherapy with Imfinzi or if the patient has significant improvement in his disease after the concurrent chemoradiation he would be considered for reevaluation by surgery but this is less likely. I discussed with the patient the adverse effect of the chemotherapy including but not limited to alopecia, myelosuppression, nausea and vomiting, peripheral neuropathy, liver or renal dysfunction.  I will refer the patient to radiation oncology for evaluation and discussion of the radiotherapy portion of his treatment. For the hypokalemia I will start the patient on potassium chloride 40 mEq p.o. daily for the next 10 days. For the hypercalcemia of malignancy, I will arrange for the patient to receive 1 L of normal saline today and he will also receive Zometa 4 mg IV in the clinic today. He is expected to start the first cycle of his concurrent chemoradiation on August 10, 2018. We will arrange for the patient to have a chemotherapy education class before the first cycle of his treatment. I will also call his pharmacy with prescription for Compazine 10 mg p.o. every 6 hours as needed for nausea. For pain management I gave the patient prescription for Percocet 5/325 mg p.o. every 6 hours as needed for pain.  Signs/Symptoms  Weight changes, if any: He lost around 5 pounds in the last few weeks  Respiratory complaints, if any: The patient denied having any significant shortness of breath or hemoptysis.  Hemoptysis, if any: The patient denied having any significant shortness of breath or hemoptysis.  Pain issues, if any:   complain on the left side of the chest as well as upper back.  SAFETY ISSUES:  Prior radiation? No  Pacemaker/ICD? No  Possible current pregnancy? N/A, pt is  male  Is the patient on methotrexate? No  Current Complaints / other details:  Pt presents today for initial consult with Dr. Sondra Come for Radiation Oncology. Pt is accompanied by brother.   BP (!) 189/87 (BP Location: Right Arm, Patient Position: Sitting)   Pulse 88   Temp 97.7 F (36.5 C) (Oral)   Resp 20   Ht 5\' 9"  (1.753 m)   Wt 190 lb (86.2 kg)   SpO2 100%   BMI 28.06 kg/m   Wt Readings from Last 3 Encounters:  08/05/18 190 lb (86.2 kg)  07/31/18 191 lb 0.3 oz (86.6 kg)  07/17/18 195 lb (88.5 kg)   Loma Sousa, RN BSN

## 2018-08-05 ENCOUNTER — Encounter: Payer: Self-pay | Admitting: Radiation Oncology

## 2018-08-05 ENCOUNTER — Other Ambulatory Visit: Payer: Self-pay | Admitting: Oncology

## 2018-08-05 ENCOUNTER — Ambulatory Visit
Admission: RE | Admit: 2018-08-05 | Discharge: 2018-08-05 | Disposition: A | Discharge status: Home / Self Care | Payer: Self-pay | Source: Ambulatory Visit | Attending: Internal Medicine | Admitting: Internal Medicine

## 2018-08-05 ENCOUNTER — Ambulatory Visit
Admission: RE | Admit: 2018-08-05 | Discharge: 2018-08-05 | Disposition: A | Discharge status: Home / Self Care | Payer: Self-pay | Source: Ambulatory Visit | Attending: Radiation Oncology | Admitting: Radiation Oncology

## 2018-08-05 ENCOUNTER — Other Ambulatory Visit: Payer: Self-pay

## 2018-08-05 VITALS — BP 189/87 | HR 88 | Temp 97.7°F | Resp 20 | Ht 69.0 in | Wt 190.0 lb

## 2018-08-05 DIAGNOSIS — C7951 Secondary malignant neoplasm of bone: Secondary | ICD-10-CM | POA: Insufficient documentation

## 2018-08-05 DIAGNOSIS — C3412 Malignant neoplasm of upper lobe, left bronchus or lung: Secondary | ICD-10-CM

## 2018-08-05 DIAGNOSIS — I10 Essential (primary) hypertension: Secondary | ICD-10-CM | POA: Insufficient documentation

## 2018-08-05 DIAGNOSIS — C7989 Secondary malignant neoplasm of other specified sites: Secondary | ICD-10-CM | POA: Insufficient documentation

## 2018-08-05 DIAGNOSIS — Z7984 Long term (current) use of oral hypoglycemic drugs: Secondary | ICD-10-CM | POA: Insufficient documentation

## 2018-08-05 DIAGNOSIS — Z79899 Other long term (current) drug therapy: Secondary | ICD-10-CM | POA: Insufficient documentation

## 2018-08-05 DIAGNOSIS — R918 Other nonspecific abnormal finding of lung field: Secondary | ICD-10-CM | POA: Insufficient documentation

## 2018-08-05 DIAGNOSIS — R911 Solitary pulmonary nodule: Secondary | ICD-10-CM | POA: Insufficient documentation

## 2018-08-05 DIAGNOSIS — C3492 Malignant neoplasm of unspecified part of left bronchus or lung: Secondary | ICD-10-CM

## 2018-08-05 DIAGNOSIS — Z87891 Personal history of nicotine dependence: Secondary | ICD-10-CM | POA: Insufficient documentation

## 2018-08-05 DIAGNOSIS — E119 Type 2 diabetes mellitus without complications: Secondary | ICD-10-CM | POA: Insufficient documentation

## 2018-08-05 DIAGNOSIS — C3490 Malignant neoplasm of unspecified part of unspecified bronchus or lung: Secondary | ICD-10-CM | POA: Insufficient documentation

## 2018-08-05 LAB — GLUCOSE, CAPILLARY: Glucose-Capillary: 137 mg/dL — ABNORMAL HIGH (ref 70–99)

## 2018-08-05 MED ORDER — FLUDEOXYGLUCOSE F - 18 (FDG) INJECTION
9.9000 | Freq: Once | INTRAVENOUS | Status: AC | PRN
  Administered 2018-08-05: 9.88 via INTRAVENOUS

## 2018-08-05 NOTE — Progress Notes (Signed)
Radiation Oncology         [phone] ________________________________  Initial Outpatient Consultation  Name: Dylan Jacobs MRN: [id number]  Date: 08/05/2018  DOB: 1961/03/29  CC:[name], MD  Curt Bears, MD   REFERRING PHYSICIAN: Curt Bears, MD  DIAGNOSIS: The encounter diagnosis was Stage III squamous cell carcinoma of left lung ([place]).   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 male who is presenting to the office today for evaluation of newly diagnosed squamous cell carcinoma. He is accompanied by his brother. He reports not having seen a primary care doctor in 5 years, and says he is supposed to be on blood pressure medication but is not able to afford it. He initially presented to the ED on 07/10/18 with severe rib cage pain.  He underwent a CT scan with and without contrast on 11/22 that showed a large round mass in the left lung which extends through the chest wall to the underlying musculature and has destructive features involving multiple left lateral ribs. Differential includes aggressive bronchogenic carcinoma with central necrosis versus aggressive pulmonary infection with rib destruction. Alternatively findings could represent bronchogenic carcinoma with superimposed necrotizing pneumonia. Favor at least some component of bronchogenic carcinoma.   He had a PET scan today, results pending. He has a brain MRI with and without contrast ordered for this Friday.  he reports associated pain over his ribs and back pain. The pain is severe, for which he is taking Ibuprofen.he has recently been given a prescription for Percocet. He has vision problems related to cataracts. he denies spitting up blood, headaches and any other symptoms.    PREVIOUS RADIATION THERAPY: No  PAST MEDICAL HISTORY:  has a past medical history of Diabetes mellitus without  complication ([place]), Fractured rib, Hypertension, Stroke ([place]), and Ulcer.    PAST SURGICAL HISTORY:History reviewed. No pertinent surgical history.  FAMILY HISTORY: family history includes ALS in his brother; Cancer in his brother; Prostate cancer in his father.  SOCIAL HISTORY:  reports that he quit smoking about 8 years ago. His smoking use included cigarettes. He has a 60.00 pack-year smoking history. He does not have any smokeless tobacco history on file. He reports current alcohol use. He reports current drug use. Drug: Marijuana.  ALLERGIES: Patient has no known allergies.  MEDICATIONS:  Current Outpatient Medications  Medication Sig Dispense Refill  . ibuprofen (ADVIL,MOTRIN) 200 MG tablet Take 800 mg by mouth every 4 (four) hours as needed for mild pain.    Marland Kitchen ibuprofen (ADVIL,MOTRIN) 800 MG tablet Take 1 tablet (800 mg total) by mouth every 8 (eight) hours as needed. 30 tablet 0  . potassium chloride 20 MEQ TBCR Take 40 mEq by mouth daily. 20 tablet 0  . prochlorperazine (COMPAZINE) 10 MG tablet Take 1 tablet (10 mg total) by mouth every 6 (six) hours as needed for nausea or vomiting. 30 tablet 0  . atorvastatin (LIPITOR) 40 MG tablet Take 1 tablet (40 mg total) by mouth daily. (Patient not taking: Reported on 08/05/2018) 30 tablet 1  . doxycycline (VIBRA-TABS) 100 MG tablet Take 1 tablet (100 mg total) by mouth 2 (two) times daily. (Patient not taking: Reported on 08/05/2018) 20 tablet 0  . glimepiride (AMARYL) 4 MG tablet Take 1 tablet (4 mg total) by mouth daily. (Patient not taking: Reported on 08/05/2018) 30 tablet 2  . hydrochlorothiazide (HYDRODIURIL) 25 MG tablet Take 1  tablet (25 mg total) by mouth daily. (Patient not taking: Reported on 08/05/2018) 30 tablet 1  . metFORMIN (GLUCOPHAGE) 1000 MG tablet Take 1 tablet (1,000 mg total) by mouth 2 (two) times daily with a meal. PATIENT NEEDS OFFICE VISIT FOR ADDITIONAL REFILLS (Patient not taking: Reported on 07/10/2018) 60 tablet 0    . metoprolol (LOPRESSOR) 50 MG tablet Take 1 tablet (50 mg total) by mouth 2 (two) times daily. PATIENT NEEDS OFFICE VISIT FOR ADDITIONAL REFILLS (Patient not taking: Reported on 07/10/2018) 60 tablet 0  . oxyCODONE-acetaminophen (PERCOCET/ROXICET) 5-325 MG per tablet Take 1 tablet by mouth every 6 (six) hours as needed for severe pain. (Patient not taking: Reported on 07/10/2018) 25 tablet 0  . oxyCODONE-acetaminophen (PERCOCET/ROXICET) 5-325 MG tablet Take 1 tablet by mouth every 6 (six) hours as needed for severe pain. (Patient not taking: Reported on 08/05/2018) 30 tablet 0   No current facility-administered medications for this encounter.     REVIEW OF SYSTEMS:  A 10+ POINT REVIEW OF SYSTEMS WAS OBTAINED including neurology, dermatology, psychiatry, cardiac, respiratory, lymph, extremities, GI, GU, musculoskeletal, constitutional, reproductive, HEENT. All pertinent positives are noted in the HPI. All others are negative.    PHYSICAL EXAM:  height is 5\' 9"  (1.753 m) and weight is 190 lb (86.2 kg). His oral temperature is 97.7 F (36.5 C). His blood pressure is 189/87 (abnormal) and his pulse is 88. His respiration is 20 and oxygen saturation is 100%.   General: Alert and oriented, in no acute distress HEENT: Head is normocephalic. Extraocular movements are intact. Oropharynx is clear. Neck: Neck is supple, no palpable cervical or supraclavicular lymphadenopathy. Heart: Regular in rate and rhythm with no murmurs, rubs, or gallops. Chest: decreased breath sounds noted in the left lower lung field. Right lung clear to auscultation Abdomen: Soft, nontender, nondistended, with no rigidity or guarding. Extremities: No cyanosis. pitting edema in both lower extremities. Lymphatics: see Neck Exam Skin: No concerning lesions. Musculoskeletal: symmetric strength and muscle tone throughout. Neurologic: Cranial nerves II through XII are grossly intact. No obvious focalities. Speech is fluent.  Coordination is intact. Psychiatric: Judgment and insight are intact. Affect is appropriate. Poor dentition with only two teeth remaining. Large palpable mass along low lateral chest wall, measuring to be approximately 8x7cm in size. Mildly tender to palpation. No skin involvement   ECOG = 1  0 - Asymptomatic (Fully active, able to carry on all predisease activities without restriction)  1 - Symptomatic but completely ambulatory (Restricted in physically strenuous activity but ambulatory and able to carry out work of a light or sedentary nature. For example, light housework, office work)  2 - Symptomatic, <50% in bed during the day (Ambulatory and capable of all self care but unable to carry out any work activities. Up and about more than 50% of waking hours)  3 - Symptomatic, >50% in bed, but not bedbound (Capable of only limited self-care, confined to bed or chair 50% or more of waking hours)  4 - Bedbound (Completely disabled. Cannot carry on any self-care. Totally confined to bed or chair)  5 - Death   Eustace Pen MM, Creech RH, Tormey DC, et al. [phone]). "Toxicity and response criteria of the [facility]". [place] Oncol. 5 (6): 649-55  LABORATORY DATA:  Lab Results  Component Value Date   WBC 24.5 (H) 07/31/2018   HGB 15.1 07/31/2018   HCT 46.7 07/31/2018   MCV 82.8 07/31/2018   PLT 383 07/31/2018   NEUTROABS 21.4 (  H) 07/31/2018   Lab Results  Component Value Date   NA 139 07/31/2018   K 2.9 (LL) 07/31/2018   CL 96 (L) 07/31/2018   CO2 32 07/31/2018   GLUCOSE 153 (H) 07/31/2018   CREATININE 1.07 07/31/2018   CALCIUM 13.8 (HH) 07/31/2018      RADIOGRAPHY: Dg Ribs Unilateral W/chest Left  Result Date: 07/10/2018 CLINICAL DATA:  Left rib pain. EXAM: LEFT RIBS AND CHEST - 3+ VIEW COMPARISON:  Radiographs of May 30, 2014. FINDINGS: There appears to be lytic destruction involving the lateral portions of the left sixth and seventh ribs with  overlying large soft tissue mass concerning for malignancy. Multiple old left rib fractures are noted as well. No pneumothorax is noted. Probable small pleural effusion is noted. Right lung is clear. IMPRESSION: Lytic destruction is seen involving the left sixth and seventh ribs with overlying large soft tissue mass concerning for malignancy. Small left pleural effusion is noted. CT scan of the chest with contrast administration is recommended for further evaluation. Electronically Signed   By: Marijo Conception, M.D.   On: 07/10/2018 12:44   Ct Angio Chest Pe W And/or Wo Contrast  Result Date: 07/10/2018 CLINICAL DATA:  Abnormal chest radiograph aggressive lesion LEFT hemithorax EXAM: CT ANGIOGRAPHY CHEST WITH CONTRAST TECHNIQUE: Multidetector CT imaging of the chest was performed using the standard protocol during bolus administration of intravenous contrast. Multiplanar CT image reconstructions and MIPs were obtained to evaluate the vascular anatomy. CONTRAST:  152mL ISOVUE-370 IOPAMIDOL (ISOVUE-370) INJECTION 76% COMPARISON:  CHEST RADIOGRAPH 06/30/2018 Coronary artery calcification and aortic atherosclerotic calcification. FINDINGS: Cardiovascular: Coronary artery calcification and aortic atherosclerotic calcification. Mediastinum/Nodes: NO AXILLARY OR SUPRACLAVICULAR. NO MEDIASTINAL ADENOPATHY. PERICARDIAL EFFUSION. Lungs/Pleura: Large round mass is centered in the LEFT lower lobe along the lateral chest wall measures 10.0 x 10.5 x 10.1 cm. Mass extends through the LEFT lateral chest wall to the line musculature. There is erosive destructive change involving multiple LEFT lateral ribs including the fourth fifth, sixth seventh and eighth ribs. Several ribs are near completely destroyed through this section involvement. No additional nodularity in the LEFT lung or RIGHT lung. NO ADDITIONAL PULMONARY NODULARITY. Upper Abdomen: Adrenal glands are partially imaged and appears normal. Number partially imaged.  Musculoskeletal: No additional aggressive osseous lesion of than the destructive lesion change in the LEFT lateral ribs. Review of the MIP images confirms the above findings. IMPRESSION: 1. Large round mass in the LEFT lung which extends through the chest wall to the underlying musculature and has destructive features involving multiple LEFT lateral ribs. Differential includes aggressive bronchogenic carcinoma with central necrosis versus aggressive pulmonary infection with rib destruction. Alternatively findings could represent bronchogenic carcinoma with superimposed necrotizing pneumonia. Favor at least some component of bronchogenic carcinoma. 2. No mediastinal lymphadenopathy. 3. Upper abdomen normal. Electronically Signed   By: Suzy Bouchard M.D.   On: 07/10/2018 14:54   Ct Biopsy  Result Date: 1September 30, 202019 INDICATION: Large left lung mass invading the left chest wall EXAM: CT BIOPSY LEFT LUNG MASS MEDICATIONS: 1% LIDOCAINE LOCAL ANESTHESIA/SEDATION: Moderate (conscious) sedation was employed during this procedure. A total of Versed 3.0 mg and Fentanyl 150 mcg was administered intravenously. Moderate Sedation Time: 15 minutes. The patient's level of consciousness and vital signs were monitored continuously by radiology nursing throughout the procedure under my direct supervision. FLUOROSCOPY TIME:  Fluoroscopy Time: NONE. COMPLICATIONS: None immediate. PROCEDURE: Informed written consent was obtained from the patient after a thorough discussion of the procedural risks, benefits and alternatives. All questions  were addressed. Maximal Sterile Barrier Technique was utilized including caps, mask, sterile gowns, sterile gloves, sterile drape, hand hygiene and skin antiseptic. A timeout was performed prior to the initiation of the procedure. Previous imaging reviewed. Patient positioned supine. Noncontrast localization CT performed. The large left chest wall mass invading the adjacent ribs was localized.  Overlying skin marked for a lateral approach. Under sterile conditions and local anesthesia, a 17 gauge 6.8 cm coaxial guide needle was advanced to the periphery of the left lung mass. Needle position confirmed with CT. 18 gauge core biopsies obtained. Samples were small and fragmented and placed in formalin. Needle tract occluded with the bio sentry device. Postprocedure imaging demonstrates no hemorrhage or hematoma. Negative for pneumothorax. Patient tolerated the biopsy well. IMPRESSION: Successful CT-guided left invasive lung mass 18 gauge core biopsy Electronically Signed   By: Jerilynn Mages.  Shick M.D.   On: 1September 16, 202019 12:33   Dg Chest Port 1 View  Result Date: 1September 16, 202019 CLINICAL DATA:  Left chest mass.  Status post biopsy. EXAM: PORTABLE CHEST 1 VIEW COMPARISON:  Chest x-ray and chest CT dated 07/10/2018 FINDINGS: Again noted is the large mass in the left hemithorax with multiple adjacent destroyed left ribs. Small left effusion, unchanged. No pneumothorax after lung biopsy. Heart size and vascularity are normal.  Right lung is clear. IMPRESSION: No acute abnormalities. No pneumothorax after biopsy. Stable left lung mass and left rib destruction. Electronically Signed   By: Lorriane Shire M.D.   On: 1September 16, 202019 13:53      IMPRESSION:  Poorly differentiated squamous cell carcinoma with sarcomatoid changes, stage IIIA (T4, N0, M0)  Pt underwent a PET scan today with results pending. He is scheduled for a brain MRI this weekend to complete his staging workup.  Pt would be a good candidate for an aggressive course of radiation therapy directed at his large left chest mass. He would likely benefit from radiosensitizing chemotherapy for better tumor shrinkage.  Today, I talked to the patient and his brother about the findings and work-up thus far.  We discussed the natural history of squamous cell carcinoma and general treatment, highlighting the role of radiotherapy in the management.  We discussed the  available radiation techniques, and focused on the details of logistics and delivery.  We reviewed the anticipated acute and late sequelae associated with radiation in this setting.  The patient was encouraged to ask questions that I answered to the best of my ability.  A patient consent form was discussed and signed.  We retained a copy for our records.  The patient would like to proceed with radiation and will be scheduled for CT simulation.   PLAN: He will return tomorrow at 1:30 for CT simulation. Treatment will begin next Tuesday, 12/24. Radiosensitizing chemotherapy to start next week. Anticipate approximately 6 weeks of radiation therapy  ------------------------------------------------  Blair Promise, PhD, MD  This document serves as a record of services personally performed by Gery Pray, MD. It was created on his behalf by Mary-Margaret Loma Messing, a trained medical scribe. The creation of this record is based on the scribe's personal observations and the provider's statements to them. This document has been checked and approved by the attending provider.

## 2018-08-06 ENCOUNTER — Encounter: Payer: Self-pay | Admitting: General Practice

## 2018-08-06 ENCOUNTER — Ambulatory Visit
Admission: RE | Admit: 2018-08-06 | Discharge: 2018-08-06 | Disposition: A | Discharge status: Home / Self Care | Payer: Self-pay | Source: Ambulatory Visit | Attending: Radiation Oncology | Admitting: Radiation Oncology

## 2018-08-06 DIAGNOSIS — C3492 Malignant neoplasm of unspecified part of left bronchus or lung: Secondary | ICD-10-CM

## 2018-08-06 NOTE — Progress Notes (Signed)
  Radiation Oncology         [phone] ________________________________  Name: Dylan Jacobs MRN: [id number]  Date: 08/06/2018  DOB: [birth year]  SIMULATION AND TREATMENT PLANNING NOTE    ICD-10-CM   1. Stage III squamous cell carcinoma of left lung ([facility]) C34.92     DIAGNOSIS:  Stage III squamous cell carcinoma of left lung ([place]).   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  Identity was confirmed.  All relevant records and images related to the planned course of therapy were reviewed.  The patient freely provided informed written consent to proceed with treatment after reviewing the details related to the planned course of therapy. The consent form was witnessed and verified by the simulation staff.  Then, the patient was set-up in a stable reproducible  supine position for radiation therapy.  CT images were obtained.  Surface markings were placed.  The CT images were loaded into the planning software.  Then the target and avoidance structures were contoured.  Treatment planning then occurred.  The radiation prescription was entered and confirmed.  Then, I designed and supervised the construction of a total of 5 medically necessary complex treatment devices.  I have requested : 3D Simulation  I have requested a DVH of the following structures: heart, lungs, gross tumor volume, planning target volume, esophagus.  I have ordered:dose calc.  PLAN:  The patient will receive 60 Gy in 30 fractions.  Special Treatment Procedure Note: The patient will be receiving radiosensitizing chemotherapy. Given the potential of increased toxicities related to combined therapy and the necessity for close monitoring of the patient and blood work, this constitutes a special treatment procedure.  -----------------------------------  Blair Promise, PhD, MD

## 2018-08-06 NOTE — Progress Notes (Signed)
[facility] Psychosocial Distress Screening Clinical Social Work  Clinical Social Work was referred by distress screening protocol.  The patient scored a 5 on the Psychosocial Distress Thermometer which indicates moderate distress. Clinical Social Worker contacted patient by phone to assess for distress and other psychosocial needs. Unable to reach patient at home or mobile number.  Left VM w information on services available at [facility], left contact information and encouragement to call back if desired.    ONCBCN DISTRESS SCREENING 08/05/2018  Screening Type Initial Screening  Distress experienced in past week (1-10) 5  Practical problem type Insurance;Food  Emotional problem type Nervousness/Anxiety;Adjusting to illness  Information Concerns Type Lack of info about diagnosis;Lack of info about treatment  Physical Problem type Pain;Loss of appetitie;Swollen arms/legs    Clinical Social Worker follow up needed: No.  If yes, follow up plan:  Beverely Pace, [place], LCSW Clinical Social Worker Phone:  [phone]

## 2018-08-08 ENCOUNTER — Ambulatory Visit (HOSPITAL_COMMUNITY)
Admission: RE | Admit: 2018-08-08 | Discharge: 2018-08-08 | Disposition: A | Discharge status: Home / Self Care | Payer: Self-pay | Source: Ambulatory Visit | Attending: Internal Medicine | Admitting: Internal Medicine

## 2018-08-08 DIAGNOSIS — C3490 Malignant neoplasm of unspecified part of unspecified bronchus or lung: Secondary | ICD-10-CM | POA: Insufficient documentation

## 2018-08-08 MED ORDER — GADOBUTROL 1 MMOL/ML IV SOLN
9.0000 mL | Freq: Once | INTRAVENOUS | Status: AC | PRN
  Administered 2018-08-08: 9 mL via INTRAVENOUS

## 2018-08-10 ENCOUNTER — Inpatient Hospital Stay: Payer: Self-pay

## 2018-08-10 ENCOUNTER — Other Ambulatory Visit: Payer: Self-pay | Admitting: Medical Oncology

## 2018-08-10 ENCOUNTER — Telehealth: Payer: Self-pay | Admitting: Medical Oncology

## 2018-08-10 ENCOUNTER — Telehealth: Payer: Self-pay | Admitting: Internal Medicine

## 2018-08-10 ENCOUNTER — Encounter: Payer: Self-pay | Admitting: Internal Medicine

## 2018-08-10 VITALS — BP 174/79 | HR 71 | Temp 97.5°F | Resp 18

## 2018-08-10 DIAGNOSIS — G893 Neoplasm related pain (acute) (chronic): Secondary | ICD-10-CM

## 2018-08-10 DIAGNOSIS — C3492 Malignant neoplasm of unspecified part of left bronchus or lung: Secondary | ICD-10-CM

## 2018-08-10 DIAGNOSIS — C3490 Malignant neoplasm of unspecified part of unspecified bronchus or lung: Secondary | ICD-10-CM

## 2018-08-10 LAB — CMP (CANCER CENTER ONLY)
ALT: 8 U/L (ref 0–44)
Albumin: 2.8 g/dL — ABNORMAL LOW (ref 3.5–5.0)
Alkaline Phosphatase: 121 U/L (ref 38–126)
Anion gap: 11 (ref 5–15)
BUN: 11 mg/dL (ref 6–20)
CHLORIDE: 103 mmol/L (ref 98–111)
CO2: 24 mmol/L (ref 22–32)
Calcium: 9.2 mg/dL (ref 8.9–10.3)
Creatinine: 0.92 mg/dL (ref 0.61–1.24)
GFR, Est AFR Am: >60 mL/min (ref >60)
GFR, Estimated: >60 mL/min (ref >60)
Glucose, Bld: 190 mg/dL — ABNORMAL HIGH (ref 70–99)
Potassium: 3.8 mmol/L (ref 3.5–5.1)
Sodium: 138 mmol/L (ref 135–145)
Total Bilirubin: 0.6 mg/dL (ref 0.3–1.2)
Total Protein: 6.5 g/dL (ref 6.5–8.1)

## 2018-08-10 LAB — CBC WITH DIFFERENTIAL (CANCER CENTER ONLY)
Abs Immature Granulocytes: 0.18 10*3/uL — ABNORMAL HIGH (ref 0.00–0.07)
Basophils Absolute: 0.1 10*3/uL (ref 0.0–0.1)
Basophils Relative: 1 %
Eosinophils Absolute: 1.1 10*3/uL — ABNORMAL HIGH (ref 0.0–0.5)
Eosinophils Relative: 4 %
HCT: 44.4 % (ref 39.0–52.0)
HEMOGLOBIN: 14 g/dL (ref 13.0–17.0)
Immature Granulocytes: 1 %
LYMPHS PCT: 5 %
Lymphs Abs: 1.2 10*3/uL (ref 0.7–4.0)
MCH: 26.6 pg (ref 26.0–34.0)
MCHC: 31.5 g/dL (ref 30.0–36.0)
MCV: 84.4 fL (ref 80.0–100.0)
Monocytes Absolute: 1 10*3/uL (ref 0.1–1.0)
Monocytes Relative: 4 %
NEUTROS ABS: 21 10*3/uL — AB (ref 1.7–7.7)
Neutrophils Relative %: 85 %
Platelet Count: 353 10*3/uL (ref 150–400)
RBC: 5.26 MIL/uL (ref 4.22–5.81)
RDW: 13.8 % (ref 11.5–15.5)
WBC Count: 24.5 10*3/uL — ABNORMAL HIGH (ref 4.0–10.5)
nRBC: 0 % (ref 0.0–0.2)

## 2018-08-10 MED ORDER — SODIUM CHLORIDE 0.9 % IV SOLN
20.0000 mg | Freq: Once | INTRAVENOUS | Status: AC
  Administered 2018-08-10: 20 mg via INTRAVENOUS
  Filled 2018-08-10: qty 2

## 2018-08-10 MED ORDER — PALONOSETRON HCL INJECTION 0.25 MG/5ML
0.2500 mg | Freq: Once | INTRAVENOUS | Status: AC
  Administered 2018-08-10: 0.25 mg via INTRAVENOUS

## 2018-08-10 MED ORDER — PALONOSETRON HCL INJECTION 0.25 MG/5ML
INTRAVENOUS | Status: AC
  Filled 2018-08-10: qty 5

## 2018-08-10 MED ORDER — SODIUM CHLORIDE 0.9 % IV SOLN
Freq: Once | INTRAVENOUS | Status: AC
  Administered 2018-08-10: 10:00:00 via INTRAVENOUS
  Filled 2018-08-10: qty 250

## 2018-08-10 MED ORDER — DIPHENHYDRAMINE HCL 50 MG/ML IJ SOLN
50.0000 mg | Freq: Once | INTRAMUSCULAR | Status: AC
  Administered 2018-08-10: 50 mg via INTRAVENOUS

## 2018-08-10 MED ORDER — DIPHENHYDRAMINE HCL 50 MG/ML IJ SOLN
INTRAMUSCULAR | Status: AC
  Filled 2018-08-10: qty 1

## 2018-08-10 MED ORDER — MORPHINE SULFATE 4 MG/ML IJ SOLN
2.0000 mg | INTRAMUSCULAR | Status: AC
  Administered 2018-08-10: 2 mg via INTRAVENOUS
  Filled 2018-08-10: qty 1

## 2018-08-10 MED ORDER — FAMOTIDINE IN NACL 20-0.9 MG/50ML-% IV SOLN
INTRAVENOUS | Status: AC
  Filled 2018-08-10: qty 50

## 2018-08-10 MED ORDER — SODIUM CHLORIDE 0.9 % IV SOLN
260.0000 mg | Freq: Once | INTRAVENOUS | Status: AC
  Administered 2018-08-10: 260 mg via INTRAVENOUS
  Filled 2018-08-10: qty 26

## 2018-08-10 MED ORDER — FAMOTIDINE IN NACL 20-0.9 MG/50ML-% IV SOLN
20.0000 mg | Freq: Once | INTRAVENOUS | Status: AC
  Administered 2018-08-10: 20 mg via INTRAVENOUS

## 2018-08-10 MED ORDER — SODIUM CHLORIDE 0.9 % IV SOLN
45.0000 mg/m2 | Freq: Once | INTRAVENOUS | Status: AC
  Administered 2018-08-10: 90 mg via INTRAVENOUS
  Filled 2018-08-10: qty 15

## 2018-08-10 MED ORDER — MORPHINE SULFATE (PF) 4 MG/ML IV SOLN
INTRAVENOUS | Status: AC
  Filled 2018-08-10: qty 1

## 2018-08-10 NOTE — Progress Notes (Signed)
Went to infusion to introduce myself to patient as Arboriculturist and to offer available resources. There is no available copay assistance for his diagnosis.  Discussed one-time $76 Engineer, drilling to assist with personal expenses while going through treatment. He verbalized understanding.  Gave my card for any additional financial questions or concerns.

## 2018-08-10 NOTE — Progress Notes (Signed)
Per Tomi Bamberger in lab, having trouble resulting CMET in Epic.  All labs normal, except glucose 190 and AST less than 6.

## 2018-08-10 NOTE — Telephone Encounter (Signed)
Spoke with patient while he was in treatment and took him an updated appointment calendar.

## 2018-08-10 NOTE — Patient Instructions (Signed)
[facility] Discharge Instructions for Patients Receiving Chemotherapy  Today you received the following chemotherapy agents taxol/carboplatin  To help prevent nausea and vomiting after your treatment, we encourage you to take your nausea medication as directed   If you develop nausea and vomiting that is not controlled by your nausea medication, call the clinic.   BELOW ARE SYMPTOMS THAT SHOULD BE REPORTED IMMEDIATELY:  *FEVER GREATER THAN 100.5 F  *CHILLS WITH OR WITHOUT FEVER  NAUSEA AND VOMITING THAT IS NOT CONTROLLED WITH YOUR NAUSEA MEDICATION  *UNUSUAL SHORTNESS OF BREATH  *UNUSUAL BRUISING OR BLEEDING  TENDERNESS IN MOUTH AND THROAT WITH OR WITHOUT PRESENCE OF ULCERS  *URINARY PROBLEMS  *BOWEL PROBLEMS  UNUSUAL RASH Items with * indicate a potential emergency and should be followed up as soon as possible.  Feel free to call the clinic you have any questions or concerns. The clinic phone number is [phone].  

## 2018-08-10 NOTE — Telephone Encounter (Signed)
I returned call for med clearance. Dylan Jacobs  has the correct fax number for faxing request. I have not seen the form.

## 2018-08-11 ENCOUNTER — Ambulatory Visit: Payer: Managed Care, Other (non HMO) | Admitting: Family Medicine

## 2018-08-11 ENCOUNTER — Ambulatory Visit
Admission: RE | Admit: 2018-08-11 | Discharge: 2018-08-11 | Disposition: A | Discharge status: Home / Self Care | Payer: Self-pay | Source: Ambulatory Visit | Attending: Radiation Oncology | Admitting: Radiation Oncology

## 2018-08-11 ENCOUNTER — Telehealth: Payer: Self-pay | Admitting: *Deleted

## 2018-08-11 NOTE — Telephone Encounter (Signed)
-----   Message from Arty Baumgartner, RN sent at 08/10/2018 12:48 PM EST ----- Regarding: Dylan Jacobs, first time chemo First time taxol/carbo.  Tolerated well.  Dr. Julien Jacobs.

## 2018-08-11 NOTE — Telephone Encounter (Signed)
Called for chemo follow up. Pt is eating and drinking fine, no nausea or vomiting. Took some pain medicine with good relief

## 2018-08-12 ENCOUNTER — Emergency Department (HOSPITAL_COMMUNITY)
Admission: EM | Admit: 2018-08-12 | Discharge: 2018-08-13 | Disposition: A | Discharge status: Home / Self Care | Payer: Self-pay | Attending: Emergency Medicine | Admitting: Emergency Medicine

## 2018-08-12 ENCOUNTER — Telehealth: Payer: Self-pay | Admitting: Hematology

## 2018-08-12 ENCOUNTER — Encounter (HOSPITAL_COMMUNITY): Payer: Self-pay | Admitting: Emergency Medicine

## 2018-08-12 ENCOUNTER — Other Ambulatory Visit: Payer: Self-pay

## 2018-08-12 DIAGNOSIS — R079 Chest pain, unspecified: Secondary | ICD-10-CM | POA: Insufficient documentation

## 2018-08-12 DIAGNOSIS — Z79899 Other long term (current) drug therapy: Secondary | ICD-10-CM | POA: Insufficient documentation

## 2018-08-12 DIAGNOSIS — R0789 Other chest pain: Secondary | ICD-10-CM

## 2018-08-12 DIAGNOSIS — C3492 Malignant neoplasm of unspecified part of left bronchus or lung: Secondary | ICD-10-CM

## 2018-08-12 DIAGNOSIS — C3490 Malignant neoplasm of unspecified part of unspecified bronchus or lung: Secondary | ICD-10-CM | POA: Insufficient documentation

## 2018-08-12 DIAGNOSIS — F17210 Nicotine dependence, cigarettes, uncomplicated: Secondary | ICD-10-CM | POA: Insufficient documentation

## 2018-08-12 DIAGNOSIS — E119 Type 2 diabetes mellitus without complications: Secondary | ICD-10-CM | POA: Insufficient documentation

## 2018-08-12 NOTE — Telephone Encounter (Signed)
Pt and his brother called, for worsening pain in left chest wall and back. The pain is similar to the pain he has been having due to his lung cancer. He is on percocet 1 tab every 6 hours and ibuprofen every 6 hours. He states the pain meds help, but it only last about 4 hours. I recommend him to increase percocet to 1 tab every 4 hours as needed for pain, he has 15 pills left. I will inform Dr. Julien Nordmann to refill, or adjust his pain meds. He will come in tomorrow for radiation, and see Dr. Julien Nordmann on Monday.   Truitt Merle  08/12/2018

## 2018-08-12 NOTE — ED Triage Notes (Signed)
Pt reports rib area pain around area of cancer tumor that he is being treated at the [facility] cancer center.  States percocet and ibuprophan ineffective.

## 2018-08-13 ENCOUNTER — Ambulatory Visit
Admission: RE | Admit: 2018-08-13 | Discharge: 2018-08-13 | Disposition: A | Discharge status: Home / Self Care | Payer: Self-pay | Source: Ambulatory Visit | Attending: Radiation Oncology | Admitting: Radiation Oncology

## 2018-08-13 ENCOUNTER — Other Ambulatory Visit: Payer: Self-pay | Admitting: Internal Medicine

## 2018-08-13 MED ORDER — OXYCODONE-ACETAMINOPHEN 7.5-325 MG PO TABS: 1.0000 | ORAL_TABLET | ORAL | 0 refills | Status: DC | PRN

## 2018-08-13 MED ORDER — NAPROXEN 500 MG PO TABS
500.0000 mg | ORAL_TABLET | Freq: Once | ORAL | Status: AC
  Administered 2018-08-13: 500 mg via ORAL
  Filled 2018-08-13: qty 1

## 2018-08-13 MED ORDER — GABAPENTIN 100 MG PO CAPS
100.0000 mg | ORAL_CAPSULE | Freq: Once | ORAL | Status: DC
  Filled 2018-08-13: qty 1

## 2018-08-13 MED ORDER — GABAPENTIN 300 MG PO CAPS: ORAL_CAPSULE | ORAL | 0 refills | Status: DC

## 2018-08-13 MED ORDER — GABAPENTIN 300 MG PO CAPS
300.0000 mg | ORAL_CAPSULE | Freq: Once | ORAL | Status: AC
  Administered 2018-08-13: 300 mg via ORAL
  Filled 2018-08-13: qty 1

## 2018-08-13 MED ORDER — OXYCODONE-ACETAMINOPHEN 7.5-325 MG PO TABS
1.0000 | ORAL_TABLET | Freq: Once | ORAL | Status: AC
  Administered 2018-08-13: 1 via ORAL
  Filled 2018-08-13: qty 1

## 2018-08-13 NOTE — Discharge Instructions (Addendum)
Stop taking ibuprofen. Instead, take naproxen (Aleve) - two tablets at a time, twice a day. Take it with meals.  If you need pain medication before the new prescription is filled, take two oxycodone-acetaminophen 5-325 tablets at a time.  If you feel you are having side effects from the medication, talk with Dr. Julien Nordmann about possible adjustments.  It may take 1-2 weeks for the gabapentin to be effective.  Talk with your primary care provider about your medications for diabetes, cholesterol and blood pressure.

## 2018-08-13 NOTE — ED Notes (Signed)
Patient has a history of hypertension and has not taken medications yet. Will take home medications.

## 2018-08-13 NOTE — ED Provider Notes (Signed)
[facility] DEPT Provider Note   CSN: [id number] Arrival date & time: 08/12/18  2328     History   Chief Complaint Chief Complaint  Patient presents with  . Chest Pain    HPI Rubel HOLLEY Dylan Jacobs is a 57 y.o. male.  The history is provided by the patient.  He has history of hypertension, diabetes, hyperlipidemia, stroke, lung cancer and comes in because of chest pain which has not been well controlled.  Lung cancer was recently diagnosed and he has started on chemotherapy and radiation therapy.  He has pain in the left lateral chest wall which radiates around to the front.  In the front, he describes a burning pain.  He has been taking oxycodone-acetaminophen 5-325 which does give pain relief, but has not been lasting long enough.  He had been advised to increase his frequency of medication from every 6 hours to every 4 hours.  He is now only getting pain relief for 2-3 hours.  He has also been advised to combine with ibuprofen.  This does not seem to be getting much pain relief.  He denies dyspnea or fever or cough.  He is still smoking, but trying to stop.  Past Medical History:  Diagnosis Date  . Diabetes mellitus without complication ([place])   . Fractured rib   . Hypertension   . Stroke ([place])   . Ulcer     Patient Active Problem List   Diagnosis Date Noted  . Goals of care, counseling/discussion 08/01/2018  . Encounter for antineoplastic chemotherapy 08/01/2018  . Cancer associated pain 08/01/2018  . 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 Bronchogenic lung cancer, left ([place]) 07/11/2018  . Lung nodule 05/30/2014  . DM (diabetes mellitus) ([place]) 12/29/2011  . HTN (hypertension) 12/29/2011  . Hyperlipidemia 12/29/2011    History reviewed. No pertinent surgical history.      Home Medications    Prior to Admission medications   Medication Sig Start Date End Date Taking? Authorizing Provider  ibuprofen (ADVIL,MOTRIN)  200 MG tablet Take 800 mg by mouth every 4 (four) hours as needed for mild pain.   Yes [provider]  oxyCODONE-acetaminophen (PERCOCET/ROXICET) 5-325 MG tablet Take 1 tablet by mouth every 6 (six) hours as needed for severe pain. 07/31/18  Yes Curt Bears, MD  atorvastatin (LIPITOR) 40 MG tablet Take 1 tablet (40 mg total) by mouth daily. Patient not taking: Reported on 08/05/2018 11/11/15   Noe Gens, PA-C  doxycycline (VIBRA-TABS) 100 MG tablet Take 1 tablet (100 mg total) by mouth 2 (two) times daily. Patient not taking: Reported on 08/05/2018 05/30/14   Darlyne Russian, MD  glimepiride (AMARYL) 4 MG tablet Take 1 tablet (4 mg total) by mouth daily. Patient not taking: Reported on 08/05/2018 05/30/14   Darlyne Russian, MD  hydrochlorothiazide (HYDRODIURIL) 25 MG tablet Take 1 tablet (25 mg total) by mouth daily. Patient not taking: Reported on 08/05/2018 11/11/15   Noe Gens, PA-C  ibuprofen (ADVIL,MOTRIN) 800 MG tablet Take 1 tablet (800 mg total) by mouth every 8 (eight) hours as needed. Patient not taking: Reported on 08/13/2018 05/29/14   Dalia Heading, PA-C  metFORMIN (GLUCOPHAGE) 1000 MG tablet Take 1 tablet (1,000 mg total) by mouth 2 (two) times daily with a meal. PATIENT NEEDS OFFICE VISIT FOR ADDITIONAL REFILLS Patient not taking: Reported on 07/10/2018 11/11/15   Noe Gens, PA-C  metoprolol (LOPRESSOR) 50 MG tablet Take 1 tablet (50 mg  total) by mouth 2 (two) times daily. PATIENT NEEDS OFFICE VISIT FOR ADDITIONAL REFILLS Patient not taking: Reported on 07/10/2018 03/21/15   Jaynee Eagles, PA-C  oxyCODONE-acetaminophen (PERCOCET/ROXICET) 5-325 MG per tablet Take 1 tablet by mouth every 6 (six) hours as needed for severe pain. Patient not taking: Reported on 07/10/2018 05/30/14   Darlyne Russian, MD  potassium chloride 20 MEQ TBCR Take 40 mEq by mouth daily. 07/31/18   Curt Bears, MD  prochlorperazine (COMPAZINE) 10 MG tablet Take 1 tablet (10 mg  total) by mouth every 6 (six) hours as needed for nausea or vomiting. 07/31/18   Curt Bears, MD    Family History Family History  Problem Relation Age of Onset  . Prostate cancer Father   . Cancer Brother   . ALS Brother     Social History Social History   Tobacco Use  . Smoking status: Former Smoker    Packs/day: 2.00    Years: 30.00    Pack years: 60.00    Types: Cigarettes    Last attempt to quit: 12/31/2009    Years since quitting: 8.6  . Smokeless tobacco: Never Used  Substance Use Topics  . Alcohol use: Yes  . Drug use: Yes    Types: Marijuana     Allergies   Patient has no known allergies.   Review of Systems Review of Systems  All other systems reviewed and are negative.    Physical Exam Updated Vital Signs BP (!) 188/94 (BP Location: Left Arm)   Pulse 99   Temp 97.7 F (36.5 C) (Oral)   Resp 20   SpO2 98%   Physical Exam Vitals signs and nursing note reviewed.    57 year old male, resting comfortably and in no acute distress. Vital signs are significant for elevated blood pressure. Oxygen saturation is 98%, which is normal. Head is normocephalic and atraumatic. PERRLA, EOMI. Oropharynx is clear. Neck is nontender and supple without adenopathy or JVD. Back is nontender and there is no CVA tenderness. Lungs have decreased air movement on the left without rales, wheezes, or rhonchi. Chest is nontender. Heart has regular rate and rhythm without murmur. Abdomen is soft, flat, nontender without masses or hepatosplenomegaly and peristalsis is normoactive. Extremities have no cyanosis or edema, full range of motion is present. Skin is warm and dry without rash. Neurologic: Mental status is normal, cranial nerves are intact, there are no motor or sensory deficits.  ED Treatments / Results   Procedures Procedures   Medications Ordered in ED Medications  gabapentin (NEURONTIN) capsule 300 mg (has no administration in time range)    oxyCODONE-acetaminophen (PERCOCET) 7.5-325 MG per tablet 1 tablet (1 tablet Oral Given 08/13/18 0115)  naproxen (NAPROSYN) tablet 500 mg (500 mg Oral Given 08/13/18 0115)     Initial Impression / Assessment and Plan / ED Course  I have reviewed the triage vital signs and the nursing notes.  Cancer related chest pain.  Burning type of pain suggests nerve irritation.  I will try him on gabapentin to see if this gives improved pain control.  We will also increase his dose of oxycodone-acetaminophen to try to get longer acting pain relief.  For ease of administration, recommend that he switch from ibuprofen to naproxen.  Old records are reviewed, confirming recent diagnosis of lung cancer with initiation of radiation therapy and chemotherapy.  Final Clinical Impressions(s) / ED Diagnoses   Final diagnoses:  Atypical chest pain  Malignant neoplasm of left lung, unspecified part of  lung [facility])    ED Discharge Orders         Ordered    oxyCODONE-acetaminophen (PERCOCET) 7.5-325 MG tablet  Every 4 hours PRN     08/13/18 0112    gabapentin (NEURONTIN) 300 MG capsule     08/13/18 1191           Delora Fuel, MD 47/82/95 0121

## 2018-08-14 ENCOUNTER — Ambulatory Visit
Admission: RE | Admit: 2018-08-14 | Discharge: 2018-08-14 | Disposition: A | Discharge status: Home / Self Care | Payer: Self-pay | Source: Ambulatory Visit | Attending: Radiation Oncology | Admitting: Radiation Oncology

## 2018-08-14 ENCOUNTER — Other Ambulatory Visit: Payer: Self-pay | Admitting: Internal Medicine

## 2018-08-14 DIAGNOSIS — C3492 Malignant neoplasm of unspecified part of left bronchus or lung: Secondary | ICD-10-CM

## 2018-08-15 ENCOUNTER — Inpatient Hospital Stay: Payer: Self-pay

## 2018-08-15 VITALS — BP 184/76 | HR 84 | Temp 97.6°F | Resp 18

## 2018-08-15 DIAGNOSIS — C3492 Malignant neoplasm of unspecified part of left bronchus or lung: Secondary | ICD-10-CM

## 2018-08-15 MED ORDER — FAMOTIDINE IN NACL 20-0.9 MG/50ML-% IV SOLN
INTRAVENOUS | Status: AC
  Filled 2018-08-15: qty 50

## 2018-08-15 MED ORDER — PALONOSETRON HCL INJECTION 0.25 MG/5ML
INTRAVENOUS | Status: AC
  Filled 2018-08-15: qty 5

## 2018-08-15 MED ORDER — FAMOTIDINE IN NACL 20-0.9 MG/50ML-% IV SOLN
20.0000 mg | Freq: Once | INTRAVENOUS | Status: AC
  Administered 2018-08-15: 20 mg via INTRAVENOUS

## 2018-08-15 MED ORDER — SODIUM CHLORIDE 0.9 % IV SOLN
260.0000 mg | Freq: Once | INTRAVENOUS | Status: AC
  Administered 2018-08-15: 260 mg via INTRAVENOUS
  Filled 2018-08-15: qty 26

## 2018-08-15 MED ORDER — SODIUM CHLORIDE 0.9 % IV SOLN
45.0000 mg/m2 | Freq: Once | INTRAVENOUS | Status: AC
  Administered 2018-08-15: 90 mg via INTRAVENOUS
  Filled 2018-08-15: qty 15

## 2018-08-15 MED ORDER — SODIUM CHLORIDE 0.9 % IV SOLN
20.0000 mg | Freq: Once | INTRAVENOUS | Status: AC
  Administered 2018-08-15: 20 mg via INTRAVENOUS
  Filled 2018-08-15: qty 2

## 2018-08-15 MED ORDER — PALONOSETRON HCL INJECTION 0.25 MG/5ML
0.2500 mg | Freq: Once | INTRAVENOUS | Status: AC
  Administered 2018-08-15: 0.25 mg via INTRAVENOUS

## 2018-08-15 MED ORDER — DIPHENHYDRAMINE HCL 50 MG/ML IJ SOLN
INTRAMUSCULAR | Status: AC
  Filled 2018-08-15: qty 1

## 2018-08-15 MED ORDER — SODIUM CHLORIDE 0.9 % IV SOLN
Freq: Once | INTRAVENOUS | Status: AC
  Administered 2018-08-15: 09:00:00 via INTRAVENOUS
  Filled 2018-08-15: qty 250

## 2018-08-15 MED ORDER — DIPHENHYDRAMINE HCL 50 MG/ML IJ SOLN
50.0000 mg | Freq: Once | INTRAMUSCULAR | Status: AC
  Administered 2018-08-15: 50 mg via INTRAVENOUS

## 2018-08-15 NOTE — Patient Instructions (Signed)
[facility] Discharge Instructions for Patients Receiving Chemotherapy  Today you received the following chemotherapy agents:  Taxol, Carboplatin  To help prevent nausea and vomiting after your treatment, we encourage you to take your nausea medication as prescribed.   If you develop nausea and vomiting that is not controlled by your nausea medication, call the clinic.   BELOW ARE SYMPTOMS THAT SHOULD BE REPORTED IMMEDIATELY:  *FEVER GREATER THAN 100.5 F  *CHILLS WITH OR WITHOUT FEVER  NAUSEA AND VOMITING THAT IS NOT CONTROLLED WITH YOUR NAUSEA MEDICATION  *UNUSUAL SHORTNESS OF BREATH  *UNUSUAL BRUISING OR BLEEDING  TENDERNESS IN MOUTH AND THROAT WITH OR WITHOUT PRESENCE OF ULCERS  *URINARY PROBLEMS  *BOWEL PROBLEMS  UNUSUAL RASH Items with * indicate a potential emergency and should be followed up as soon as possible.  Feel free to call the clinic should you have any questions or concerns. The clinic phone number is [phone].  Please show the CHEMO ALERT CARD at check-in to the Emergency Department and triage nurse.   

## 2018-08-15 NOTE — Progress Notes (Signed)
Per Dr. Lindi Adie (MD on duty for Sat 08/15/18), ok for pt to be treated without labs today since pt is not elderly and pt is on cycle 2 w/ normal labs this past Mon. 08/10/18.  Pharmacy had contacted RN staff to attempt to have labs scheduled last week but lab not open at [facility] today.  Kennith Center, Pharm.D., CPP 08/15/2018@9 :11 AM

## 2018-08-17 ENCOUNTER — Telehealth: Payer: Self-pay | Admitting: Internal Medicine

## 2018-08-17 ENCOUNTER — Other Ambulatory Visit: Payer: Self-pay | Admitting: Radiation Oncology

## 2018-08-17 ENCOUNTER — Encounter: Payer: Self-pay | Admitting: Internal Medicine

## 2018-08-17 ENCOUNTER — Inpatient Hospital Stay (HOSPITAL_BASED_OUTPATIENT_CLINIC_OR_DEPARTMENT_OTHER): Payer: Self-pay | Admitting: Internal Medicine

## 2018-08-17 ENCOUNTER — Ambulatory Visit
Admission: RE | Admit: 2018-08-17 | Discharge: 2018-08-17 | Disposition: A | Discharge status: Home / Self Care | Payer: Self-pay | Source: Ambulatory Visit | Attending: Radiation Oncology | Admitting: Radiation Oncology

## 2018-08-17 VITALS — BP 189/87 | HR 97 | Temp 98.1°F | Resp 17 | Ht 69.0 in | Wt 201.0 lb

## 2018-08-17 DIAGNOSIS — C3432 Malignant neoplasm of lower lobe, left bronchus or lung: Secondary | ICD-10-CM

## 2018-08-17 DIAGNOSIS — C3492 Malignant neoplasm of unspecified part of left bronchus or lung: Secondary | ICD-10-CM

## 2018-08-17 DIAGNOSIS — I10 Essential (primary) hypertension: Secondary | ICD-10-CM

## 2018-08-17 DIAGNOSIS — C7989 Secondary malignant neoplasm of other specified sites: Secondary | ICD-10-CM

## 2018-08-17 DIAGNOSIS — E119 Type 2 diabetes mellitus without complications: Secondary | ICD-10-CM

## 2018-08-17 DIAGNOSIS — Z5111 Encounter for antineoplastic chemotherapy: Secondary | ICD-10-CM

## 2018-08-17 MED ORDER — OXYCODONE-ACETAMINOPHEN 7.5-325 MG PO TABS: 1.0000 | ORAL_TABLET | ORAL | 0 refills | Status: DC | PRN

## 2018-08-17 NOTE — Telephone Encounter (Signed)
Patient already on schedule as requested per 12/30 los. Added last weekly lab/chemo for 2/3. Gave patient avs report and appointments for January and February.

## 2018-08-17 NOTE — Progress Notes (Signed)
[facility] [phone]   [phone]  OFFICE PROGRESS NOTE  [name], MD [place] [address]  DIAGNOSIS: Stage IIIa (T4, N0, M0) non-small cell lung cancer, squamous cell carcinoma with sarcomatoid features presented with large left lower lobe lung mass with invasion of the left chest wall and musculatures as well as destruction of multiple left ribs diagnosed in December 2019.Marland Kitchen  PRIOR THERAPY: None  CURRENT THERAPY: Concurrent chemoradiation with weekly carboplatin for AUC of 2 and paclitaxel 45 mg/M2.  Status post 2 cycles.  INTERVAL HISTORY: Dylan Jacobs 57 y.o. male returns to the clinic today for follow-up visit accompanied by his brother.  The patient is feeling fine today with no concerning complaints except for mild fatigue.  He tolerated the first 2 weeks of his chemotherapy fairly well.  He continues to have the left-sided chest pain and mild shortness of breath with exertion but no significant cough or hemoptysis.  He gained around 10 pounds since his last visit.  He has no nausea, vomiting, diarrhea or constipation.  He denied having any fever or chills.  He is here today for evaluation and management of any adverse effect of his treatment.  MEDICAL HISTORY: Past Medical History:  Diagnosis Date  . Diabetes mellitus without complication ([place])   . Fractured rib   . Hypertension   . Stroke ([place])   . Ulcer     ALLERGIES:  has No Known Allergies.  MEDICATIONS:  Current Outpatient Medications  Medication Sig Dispense Refill  . atorvastatin (LIPITOR) 40 MG tablet Take 1 tablet (40 mg total) by mouth daily. (Patient not taking: Reported on 08/05/2018) 30 tablet 1  . gabapentin (NEURONTIN) 300 MG capsule Take one capsule today, take twice a day tomorrow, then take three times a day.  Take with meals 50 capsule 0  . glimepiride (AMARYL) 4 MG tablet Take 1 tablet (4 mg total) by mouth daily. (Patient not  taking: Reported on 08/05/2018) 30 tablet 2  . hydrochlorothiazide (HYDRODIURIL) 25 MG tablet Take 1 tablet (25 mg total) by mouth daily. (Patient not taking: Reported on 08/05/2018) 30 tablet 1  . metFORMIN (GLUCOPHAGE) 1000 MG tablet Take 1 tablet (1,000 mg total) by mouth 2 (two) times daily with a meal. PATIENT NEEDS OFFICE VISIT FOR ADDITIONAL REFILLS (Patient not taking: Reported on 07/10/2018) 60 tablet 0  . metoprolol (LOPRESSOR) 50 MG tablet Take 1 tablet (50 mg total) by mouth 2 (two) times daily. PATIENT NEEDS OFFICE VISIT FOR ADDITIONAL REFILLS (Patient not taking: Reported on 07/10/2018) 60 tablet 0  . oxyCODONE-acetaminophen (PERCOCET) 7.5-325 MG tablet Take 1 tablet by mouth every 4 (four) hours as needed for severe pain. 30 tablet 0  . potassium chloride 20 MEQ TBCR Take 40 mEq by mouth daily. 20 tablet 0  . prochlorperazine (COMPAZINE) 10 MG tablet Take 1 tablet (10 mg total) by mouth every 6 (six) hours as needed for nausea or vomiting. 30 tablet 0   No current facility-administered medications for this visit.     SURGICAL HISTORY: No past surgical history on file.  REVIEW OF SYSTEMS:  A comprehensive review of systems was negative except for: Constitutional: positive for fatigue Respiratory: positive for dyspnea on exertion and pleurisy/chest pain   PHYSICAL EXAMINATION: General appearance: alert, cooperative, fatigued and no distress Head: Normocephalic, without obvious abnormality, atraumatic Neck: no adenopathy, no JVD, supple, symmetrical, trachea midline and thyroid not enlarged, symmetric, no tenderness/mass/nodules Lymph nodes:  Cervical, supraclavicular, and axillary nodes normal. Resp: clear to auscultation bilaterally Back: symmetric, no curvature. ROM normal. No CVA tenderness. Cardio: regular rate and rhythm, S1, S2 normal, no murmur, click, rub or gallop GI: soft, non-tender; bowel sounds normal; no masses,  no organomegaly Extremities: extremities normal,  atraumatic, no cyanosis or edema  ECOG PERFORMANCE STATUS: 1 - Symptomatic but completely ambulatory  Blood pressure (!) 189/87, pulse 97, temperature 98.1 F (36.7 C), temperature source Oral, resp. rate 17, height 5\' 9"  (1.753 m), weight 201 lb (91.2 kg), SpO2 93 %.  LABORATORY DATA: Lab Results  Component Value Date   WBC 24.5 (H) 08/10/2018   HGB 14.0 08/10/2018   HCT 44.4 08/10/2018   MCV 84.4 08/10/2018   PLT 353 08/10/2018      Chemistry      Component Value Date/Time   NA 138 08/10/2018 0817   K 3.8 08/10/2018 0817   CL 103 08/10/2018 0817   CO2 24 08/10/2018 0817   BUN 11 08/10/2018 0817   CREATININE 0.92 08/10/2018 0817   CREATININE 1.15 05/30/2014 1118      Component Value Date/Time   CALCIUM 9.2 08/10/2018 0817   ALKPHOS 121 08/10/2018 0817   AST <6 (L) 08/10/2018 0817   ALT 8 08/10/2018 0817   BILITOT 0.6 08/10/2018 0817       RADIOGRAPHIC STUDIES: Mr Jeri Cos AS Contrast  Result Date: 08/08/2018 CLINICAL DATA:  Lung cancer staging EXAM: MRI HEAD WITHOUT AND WITH CONTRAST TECHNIQUE: Multiplanar, multiecho pulse sequences of the brain and surrounding structures were obtained without and with intravenous contrast. CONTRAST:  9 mL Gadovist IV COMPARISON:  CT head 11/04/2015 FINDINGS: Brain: Mild atrophy. Negative for hydrocephalus. Negative for acute infarct. Negative for hemorrhage mass or edema. Normal enhancement postcontrast administration. No enhancing mass lesion identified. Vascular: Normal arterial flow void Skull and upper cervical spine: Negative Sinuses/Orbits: Mild mucosal edema paranasal sinuses.  Normal orbit Other: None IMPRESSION: No acute abnormality and negative for metastatic disease. Electronically Signed   By: Franchot Gallo M.D.   On: 08/08/2018 14:56   Nm Pet Image Initial (pi) Skull Base To Thigh  Result Date: 08/05/2018 CLINICAL DATA:  Initial treatment strategy for non-small cell lung cancer. EXAM: NUCLEAR MEDICINE PET SKULL BASE TO  THIGH TECHNIQUE: 9.88 mCi F-18 FDG was injected intravenously. Full-ring PET imaging was performed from the skull base to thigh after the radiotracer. CT data was obtained and used for attenuation correction and anatomic localization. Fasting blood glucose: 137 mg/dl COMPARISON:  Chest CT 07/10/2018 FINDINGS: Mediastinal blood pool activity: SUV max 1.97 NECK: No hypermetabolic lymph nodes in the neck. Incidental CT findings: Advanced bilateral carotid artery calcifications for age. CHEST: The large mass occupying the left lower hemithorax is hypermetabolic and consistent with known malignancy. SUV max is 13.91. As demonstrated on the CT scan the lesion invades the chest wall with numerous destroyed left ribs and extension of the tumor into the chest wall musculature including the intercostal muscles and the serrated anterior. Single pleural based nodule measuring 9 mm in the left upper lobe is hypermetabolic with SUV max of 3.41. This is likely a pleural metastasis. No right-sided pulmonary nodules to suggest metastatic disease. No enlarged or hypermetabolic mediastinal or hilar lymph nodes. Incidental CT findings: Age advanced aortic and coronary artery calcifications. ABDOMEN/PELVIS: No hypermetabolic lesions are identified in the liver or adrenal glands to suggest metastatic disease. The pancreas, spleen and kidneys are unremarkable. No enlarged or hypermetabolic abdominal/pelvic lymph nodes to suggest metastatic  disease. Incidental CT findings: Advanced atherosclerotic calcifications involving the aorta and branch vessels. SKELETON: Other than the directly invaded left ribs I do not C osseous metastatic lesions or other areas of hypermetabolism. Incidental CT findings: none IMPRESSION: 1. Large centrally necrotic left lower lobe lung mass invading the chest wall and destroying the ribs is hypermetabolic and consistent with known neoplasm. 2. No enlarged or hypermetabolic mediastinal or hilar lymph nodes. 3.  Single 9 mm pleural nodule in the left upper lobe is hypermetabolic and likely a pleural metastasis. 4. No findings for metastatic disease involving the abdomen/pelvis or osseous structures (other than the directly invaded ribs and left chest wall muscles). Electronically Signed   By: Marijo Sanes M.D.   On: 08/05/2018 16:23    ASSESSMENT AND PLAN: This is a very pleasant 57 years old white male recently diagnosed with stage IIIa non-small cell lung cancer, squamous cell carcinoma with sarcomatoid features presented with large left lower lobe lung mass with invasion of the left chest wall and musculatures as well as destruction of multiple left ribs diagnosed in December 2019. The patient is currently undergoing a course of concurrent chemoradiation with weekly carboplatin and paclitaxel status post 2 cycles of chemotherapy.  He has been tolerating this treatment well with no concerning adverse effects. I discussed the MRI as well as the PET scan results with the patient and his brother today. I recommended for the patient to continue his current treatment with concurrent chemoradiation with the same regimen. I will see him back for follow-up visit in 2 weeks for evaluation before starting cycle #4. For pain management he will continue with the current pain medication for now. For hypertension, I strongly recommend for the patient to take his blood pressure medication as prescribed and to consult with his primary care physician for adjustment of his medication if needed. He was advised to call immediately if he has any concerning symptoms in the interval. The patient voices understanding of current disease status and treatment options and is in agreement with the current care plan.  All questions were answered. The patient knows to call the clinic with any problems, questions or concerns. We can certainly see the patient much sooner if necessary.  I spent 10 minutes counseling the patient face to  face. The total time spent in the appointment was 15 minutes.  Disclaimer: This note was dictated with voice recognition software. Similar sounding words can inadvertently be transcribed and may not be corrected upon review.

## 2018-08-18 ENCOUNTER — Ambulatory Visit
Admission: RE | Admit: 2018-08-18 | Discharge: 2018-08-18 | Disposition: A | Discharge status: Home / Self Care | Payer: Self-pay | Source: Ambulatory Visit | Attending: Radiation Oncology | Admitting: Radiation Oncology

## 2018-08-20 ENCOUNTER — Ambulatory Visit: Payer: Self-pay

## 2018-08-20 ENCOUNTER — Ambulatory Visit
Admission: RE | Admit: 2018-08-20 | Discharge: 2018-08-20 | Disposition: A | Discharge status: Home / Self Care | Payer: Self-pay | Source: Ambulatory Visit | Attending: Radiation Oncology | Admitting: Radiation Oncology

## 2018-08-20 DIAGNOSIS — I10 Essential (primary) hypertension: Secondary | ICD-10-CM | POA: Insufficient documentation

## 2018-08-20 DIAGNOSIS — E119 Type 2 diabetes mellitus without complications: Secondary | ICD-10-CM | POA: Insufficient documentation

## 2018-08-20 DIAGNOSIS — Z7984 Long term (current) use of oral hypoglycemic drugs: Secondary | ICD-10-CM | POA: Insufficient documentation

## 2018-08-20 DIAGNOSIS — Z87891 Personal history of nicotine dependence: Secondary | ICD-10-CM | POA: Insufficient documentation

## 2018-08-20 DIAGNOSIS — C3492 Malignant neoplasm of unspecified part of left bronchus or lung: Secondary | ICD-10-CM | POA: Insufficient documentation

## 2018-08-20 DIAGNOSIS — Z79899 Other long term (current) drug therapy: Secondary | ICD-10-CM | POA: Insufficient documentation

## 2018-08-21 ENCOUNTER — Ambulatory Visit
Admission: RE | Admit: 2018-08-21 | Discharge: 2018-08-21 | Disposition: A | Discharge status: Home / Self Care | Payer: Self-pay | Source: Ambulatory Visit | Attending: Radiation Oncology | Admitting: Radiation Oncology

## 2018-08-22 ENCOUNTER — Other Ambulatory Visit: Payer: Self-pay | Admitting: Radiation Oncology

## 2018-08-22 MED ORDER — OXYCODONE-ACETAMINOPHEN 7.5-325 MG PO TABS: 1.0000 | ORAL_TABLET | ORAL | 0 refills | Status: DC | PRN

## 2018-08-22 MED ORDER — OXYCODONE-ACETAMINOPHEN 7.5-325 MG PO TABS: 1.0000 | ORAL_TABLET | ORAL | 0 refills | Status: AC | PRN

## 2018-08-23 ENCOUNTER — Emergency Department (HOSPITAL_COMMUNITY): Payer: Self-pay

## 2018-08-23 ENCOUNTER — Other Ambulatory Visit: Payer: Self-pay

## 2018-08-23 ENCOUNTER — Inpatient Hospital Stay (HOSPITAL_COMMUNITY)
Admission: EM | Admit: 2018-08-23 | Discharge: 2018-08-25 | DRG: 189 | Disposition: A | Discharge status: Home / Self Care | Payer: Self-pay | Attending: Internal Medicine | Admitting: Internal Medicine

## 2018-08-23 ENCOUNTER — Encounter (HOSPITAL_COMMUNITY): Payer: Self-pay | Admitting: Obstetrics and Gynecology

## 2018-08-23 DIAGNOSIS — I10 Essential (primary) hypertension: Secondary | ICD-10-CM

## 2018-08-23 DIAGNOSIS — F419 Anxiety disorder, unspecified: Secondary | ICD-10-CM | POA: Diagnosis present

## 2018-08-23 DIAGNOSIS — Z8042 Family history of malignant neoplasm of prostate: Secondary | ICD-10-CM

## 2018-08-23 DIAGNOSIS — G8929 Other chronic pain: Secondary | ICD-10-CM | POA: Diagnosis present

## 2018-08-23 DIAGNOSIS — J90 Pleural effusion, not elsewhere classified: Secondary | ICD-10-CM

## 2018-08-23 DIAGNOSIS — Z825 Family history of asthma and other chronic lower respiratory diseases: Secondary | ICD-10-CM

## 2018-08-23 DIAGNOSIS — E119 Type 2 diabetes mellitus without complications: Secondary | ICD-10-CM

## 2018-08-23 DIAGNOSIS — Z9221 Personal history of antineoplastic chemotherapy: Secondary | ICD-10-CM

## 2018-08-23 DIAGNOSIS — C3492 Malignant neoplasm of unspecified part of left bronchus or lung: Secondary | ICD-10-CM

## 2018-08-23 DIAGNOSIS — I509 Heart failure, unspecified: Secondary | ICD-10-CM | POA: Insufficient documentation

## 2018-08-23 DIAGNOSIS — Z8673 Personal history of transient ischemic attack (TIA), and cerebral infarction without residual deficits: Secondary | ICD-10-CM

## 2018-08-23 DIAGNOSIS — J181 Lobar pneumonia, unspecified organism: Secondary | ICD-10-CM

## 2018-08-23 DIAGNOSIS — R042 Hemoptysis: Secondary | ICD-10-CM | POA: Diagnosis present

## 2018-08-23 DIAGNOSIS — R0902 Hypoxemia: Secondary | ICD-10-CM

## 2018-08-23 DIAGNOSIS — F17210 Nicotine dependence, cigarettes, uncomplicated: Secondary | ICD-10-CM | POA: Diagnosis present

## 2018-08-23 DIAGNOSIS — J189 Pneumonia, unspecified organism: Secondary | ICD-10-CM | POA: Diagnosis present

## 2018-08-23 DIAGNOSIS — J9621 Acute and chronic respiratory failure with hypoxia: Principal | ICD-10-CM

## 2018-08-23 HISTORY — DX: Malignant (primary) neoplasm, unspecified: C80.1

## 2018-08-23 LAB — HEPATIC FUNCTION PANEL
ALT: 10 U/L (ref 0–44)
AST: 6 U/L — ABNORMAL LOW (ref 15–41)
Albumin: 2.4 g/dL — ABNORMAL LOW (ref 3.5–5.0)
Alkaline Phosphatase: 215 U/L — ABNORMAL HIGH (ref 38–126)
Bilirubin, Direct: 0.4 mg/dL — ABNORMAL HIGH (ref 0.0–0.2)
Indirect Bilirubin: 0.7 mg/dL (ref 0.3–0.9)
TOTAL PROTEIN: 5.7 g/dL — AB (ref 6.5–8.1)
Total Bilirubin: 1.1 mg/dL (ref 0.3–1.2)

## 2018-08-23 LAB — URINALYSIS, ROUTINE W REFLEX MICROSCOPIC
Bilirubin Urine: NEGATIVE
Glucose, UA: 150 mg/dL — AB
Ketones, ur: 5 mg/dL — AB
Leukocytes, UA: NEGATIVE
Nitrite: NEGATIVE
Specific Gravity, Urine: 1.025 (ref 1.005–1.030)
pH: 5 (ref 5.0–8.0)

## 2018-08-23 LAB — EXPECTORATED SPUTUM ASSESSMENT W REFEX TO RESP CULTURE

## 2018-08-23 LAB — EXPECTORATED SPUTUM ASSESSMENT W GRAM STAIN, RFLX TO RESP C

## 2018-08-23 LAB — BASIC METABOLIC PANEL
Anion gap: 13 (ref 5–15)
BUN: 22 mg/dL — AB (ref 6–20)
CO2: 25 mmol/L (ref 22–32)
Calcium: 8.1 mg/dL — ABNORMAL LOW (ref 8.9–10.3)
Chloride: 96 mmol/L — ABNORMAL LOW (ref 98–111)
Creatinine, Ser: 0.91 mg/dL (ref 0.61–1.24)
GFR calc Af Amer: >60 mL/min (ref >60)
GFR calc non Af Amer: >60 mL/min (ref >60)
GLUCOSE: 238 mg/dL — AB (ref 70–99)
Potassium: 4 mmol/L (ref 3.5–5.1)
Sodium: 134 mmol/L — ABNORMAL LOW (ref 135–145)

## 2018-08-23 LAB — GLUCOSE, CAPILLARY
Glucose-Capillary: 232 mg/dL — ABNORMAL HIGH (ref 70–99)
Glucose-Capillary: 335 mg/dL — ABNORMAL HIGH (ref 70–99)

## 2018-08-23 LAB — CBC
HCT: 39.1 % (ref 39.0–52.0)
Hemoglobin: 12.1 g/dL — ABNORMAL LOW (ref 13.0–17.0)
MCH: 26.7 pg (ref 26.0–34.0)
MCHC: 30.9 g/dL (ref 30.0–36.0)
MCV: 86.1 fL (ref 80.0–100.0)
Platelets: 323 10*3/uL (ref 150–400)
RBC: 4.54 MIL/uL (ref 4.22–5.81)
RDW: 14.3 % (ref 11.5–15.5)
WBC: 12.5 10*3/uL — ABNORMAL HIGH (ref 4.0–10.5)
nRBC: 0 % (ref 0.0–0.2)

## 2018-08-23 LAB — BRAIN NATRIURETIC PEPTIDE: B NATRIURETIC PEPTIDE 5: 238.4 pg/mL — AB (ref 0.0–100.0)

## 2018-08-23 LAB — I-STAT TROPONIN, ED: Troponin i, poc: 0 ng/mL (ref 0.00–0.08)

## 2018-08-23 MED ORDER — INSULIN ASPART 100 UNIT/ML ~~LOC~~ SOLN
0.0000 [IU] | Freq: Three times a day (TID) | SUBCUTANEOUS | Status: DC
  Administered 2018-08-23: 5 [IU] via SUBCUTANEOUS
  Administered 2018-08-24: 15 [IU] via SUBCUTANEOUS
  Administered 2018-08-24: 3 [IU] via SUBCUTANEOUS
  Administered 2018-08-24: 8 [IU] via SUBCUTANEOUS
  Administered 2018-08-25 (×2): 3 [IU] via SUBCUTANEOUS

## 2018-08-23 MED ORDER — ALBUTEROL SULFATE (2.5 MG/3ML) 0.083% IN NEBU
2.5000 mg | INHALATION_SOLUTION | RESPIRATORY_TRACT | Status: DC | PRN
  Administered 2018-08-24 (×2): 2.5 mg via RESPIRATORY_TRACT
  Filled 2018-08-23 (×2): qty 3

## 2018-08-23 MED ORDER — ACETAMINOPHEN 650 MG RE SUPP: 650.0000 mg | Freq: Four times a day (QID) | RECTAL | Status: DC | PRN

## 2018-08-23 MED ORDER — METOPROLOL TARTRATE 25 MG PO TABS
25.0000 mg | ORAL_TABLET | Freq: Two times a day (BID) | ORAL | Status: DC
  Administered 2018-08-23 (×2): 25 mg via ORAL
  Filled 2018-08-23 (×2): qty 1

## 2018-08-23 MED ORDER — SODIUM CHLORIDE 0.9 % IV SOLN
500.0000 mg | INTRAVENOUS | Status: DC
  Administered 2018-08-23 – 2018-08-24 (×2): 500 mg via INTRAVENOUS
  Filled 2018-08-23 (×4): qty 500

## 2018-08-23 MED ORDER — INSULIN ASPART 100 UNIT/ML ~~LOC~~ SOLN
0.0000 [IU] | Freq: Every day | SUBCUTANEOUS | Status: DC
  Administered 2018-08-23: 3 [IU] via SUBCUTANEOUS

## 2018-08-23 MED ORDER — ACETAMINOPHEN 325 MG PO TABS: 650.0000 mg | ORAL_TABLET | Freq: Four times a day (QID) | ORAL | Status: DC | PRN

## 2018-08-23 MED ORDER — OXYCODONE-ACETAMINOPHEN 7.5-325 MG PO TABS
1.0000 | ORAL_TABLET | ORAL | Status: DC | PRN
  Administered 2018-08-23 – 2018-08-25 (×4): 1 via ORAL
  Filled 2018-08-23 (×4): qty 1

## 2018-08-23 MED ORDER — ALBUTEROL SULFATE (2.5 MG/3ML) 0.083% IN NEBU
5.0000 mg | INHALATION_SOLUTION | Freq: Once | RESPIRATORY_TRACT | Status: AC
  Administered 2018-08-23: 5 mg via RESPIRATORY_TRACT
  Filled 2018-08-23: qty 6

## 2018-08-23 MED ORDER — SODIUM CHLORIDE 0.9 % IV SOLN
1.0000 g | INTRAVENOUS | Status: DC
  Administered 2018-08-23 – 2018-08-24 (×2): 1 g via INTRAVENOUS
  Filled 2018-08-23: qty 10
  Filled 2018-08-23: qty 1
  Filled 2018-08-23: qty 10

## 2018-08-23 MED ORDER — METHYLPREDNISOLONE SODIUM SUCC 125 MG IJ SOLR
125.0000 mg | Freq: Once | INTRAMUSCULAR | Status: AC
  Administered 2018-08-23: 125 mg via INTRAVENOUS
  Filled 2018-08-23: qty 2

## 2018-08-23 MED ORDER — ALBUTEROL SULFATE (2.5 MG/3ML) 0.083% IN NEBU
2.5000 mg | INHALATION_SOLUTION | Freq: Four times a day (QID) | RESPIRATORY_TRACT | Status: DC
  Administered 2018-08-23: 2.5 mg via RESPIRATORY_TRACT
  Filled 2018-08-23: qty 3

## 2018-08-23 MED ORDER — ONDANSETRON HCL 4 MG/2ML IJ SOLN
4.0000 mg | Freq: Four times a day (QID) | INTRAMUSCULAR | Status: DC | PRN
  Administered 2018-08-25: 4 mg via INTRAVENOUS
  Filled 2018-08-23: qty 2

## 2018-08-23 MED ORDER — HYDROCHLOROTHIAZIDE 12.5 MG PO CAPS
12.5000 mg | ORAL_CAPSULE | Freq: Every day | ORAL | Status: DC
  Administered 2018-08-23: 12.5 mg via ORAL
  Filled 2018-08-23: qty 1

## 2018-08-23 MED ORDER — GABAPENTIN 300 MG PO CAPS
300.0000 mg | ORAL_CAPSULE | Freq: Three times a day (TID) | ORAL | Status: DC
  Administered 2018-08-24 – 2018-08-25 (×2): 300 mg via ORAL
  Filled 2018-08-23 (×4): qty 1

## 2018-08-23 MED ORDER — POLYETHYLENE GLYCOL 3350 17 G PO PACK: 17.0000 g | PACK | Freq: Every day | ORAL | Status: DC | PRN

## 2018-08-23 MED ORDER — ENOXAPARIN SODIUM 40 MG/0.4ML ~~LOC~~ SOLN
40.0000 mg | SUBCUTANEOUS | Status: DC
  Administered 2018-08-24: 40 mg via SUBCUTANEOUS
  Filled 2018-08-23: qty 0.4

## 2018-08-23 MED ORDER — ENSURE ENLIVE PO LIQD
237.0000 mL | Freq: Two times a day (BID) | ORAL | Status: DC
  Administered 2018-08-23 – 2018-08-25 (×4): 237 mL via ORAL

## 2018-08-23 MED ORDER — ALBUTEROL SULFATE (2.5 MG/3ML) 0.083% IN NEBU
2.5000 mg | INHALATION_SOLUTION | Freq: Three times a day (TID) | RESPIRATORY_TRACT | Status: DC
  Administered 2018-08-24: 2.5 mg via RESPIRATORY_TRACT
  Filled 2018-08-23: qty 3

## 2018-08-23 MED ORDER — METHYLPREDNISOLONE SODIUM SUCC 40 MG IJ SOLR
40.0000 mg | Freq: Two times a day (BID) | INTRAMUSCULAR | Status: DC
  Administered 2018-08-24: 40 mg via INTRAVENOUS
  Filled 2018-08-23: qty 1

## 2018-08-23 MED ORDER — ONDANSETRON HCL 4 MG PO TABS
4.0000 mg | ORAL_TABLET | Freq: Four times a day (QID) | ORAL | Status: DC | PRN
  Administered 2018-08-23: 4 mg via ORAL
  Filled 2018-08-23 (×2): qty 1

## 2018-08-23 NOTE — ED Provider Notes (Signed)
[facility] DEPT Provider Note   CSN: [id number] Arrival date & time: 08/23/18  1117     History   Chief Complaint Chief Complaint  Patient presents with  . Shortness of Breath  . Nausea  . Weakness    HPI Dylan Jacobs is a 58 y.o. male.  The history is provided by the patient and medical records. No language interpreter was used.  Shortness of Breath   Weakness  Associated symptoms include shortness of breath.   Dylan Jacobs is a 58 y.o. male who presents to the Emergency Department complaining of sob.  He presents to the ED for sob that began last night.  He reports sudden onset of sob with associated nausea.  He feels like he has fluid on his lungs.  He has one month of progressive BLE.  Denies chest pain, fever, abdominal pain.  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  Sxs are worse with activity and lying down.  He is not on oxygen at home.  Sxs are severe, constant, worsening.   Past Medical History:  Diagnosis Date  . Cancer (HCC)    Rib  . Diabetes mellitus without complication ([place])   . Fractured rib   . Hypertension   . Stroke ([place])   . Ulcer     Patient Active Problem List   Diagnosis Date Noted  . Acute CHF (congestive heart failure) ([place]) 08/23/2018  . Pleural effusion 08/23/2018  . Community acquired pneumonia 08/23/2018  . Acute on chronic respiratory failure with hypoxia ([place]) 08/23/2018  . Goals of care, counseling/discussion 08/01/2018  . Encounter for antineoplastic chemotherapy 08/01/2018  . Cancer associated pain 08/01/2018  . Stage III squamous cell carcinoma of left lung ([place]) 07/31/2018  . Bronchogenic lung cancer, left ([place]) 07/11/2018  . Lung nodule 05/30/2014  . DM (diabetes mellitus) ([place]) 12/29/2011  . HTN (hypertension) 12/29/2011  . Hyperlipidemia 12/29/2011    History reviewed. No pertinent surgical history.      Home Medications    Prior to Admission medications     Medication Sig Start Date End Date Taking? Authorizing Provider  oxyCODONE-acetaminophen (PERCOCET) 7.5-325 MG tablet Take 1 tablet by mouth every 4 (four) hours as needed for severe pain. 08/22/18  Yes Gery Pray, MD  prochlorperazine (COMPAZINE) 10 MG tablet Take 1 tablet (10 mg total) by mouth every 6 (six) hours as needed for nausea or vomiting. 07/31/18  Yes Curt Bears, MD  gabapentin (NEURONTIN) 300 MG capsule Take one capsule today, take twice a day tomorrow, then take three times a day.  Take with meals 47/42/59   Delora Fuel, MD  potassium chloride 20 MEQ TBCR Take 40 mEq by mouth daily. 07/31/18   Curt Bears, MD    Family History Family History  Problem Relation Age of Onset  . Prostate cancer Father   . Cancer Brother   . ALS Brother   . COPD Brother     Social History Social History   Tobacco Use  . Smoking status: Former Smoker    Packs/day: 2.00    Years: 30.00    Pack years: 60.00    Types: Cigarettes    Last attempt to quit: 12/31/2009    Years since quitting: 8.6  . Smokeless tobacco: Never Used  Substance Use Topics  . Alcohol use: Yes  . Drug use: Yes    Types: Marijuana     Allergies   Patient has no known allergies.   Review  of Systems Review of Systems  Respiratory: Positive for shortness of breath.   Neurological: Positive for weakness.  All other systems reviewed and are negative.    Physical Exam Updated Vital Signs BP (!) 161/74 (BP Location: Left Arm)   Pulse 96   Temp 98.9 F (37.2 C) (Oral)   Resp (!) 24   SpO2 94%   Physical Exam Vitals signs and nursing note reviewed.  Constitutional:      Appearance: He is well-developed.  HENT:     Head: Normocephalic and atraumatic.  Cardiovascular:     Rate and Rhythm: Regular rhythm.     Heart sounds: No murmur.     Comments: tachycardic Pulmonary:     Effort: Pulmonary effort is normal. No respiratory distress.     Comments: Occasional wheeze in right lung fields.   Absent air movement in left lung fields.   Abdominal:     Palpations: Abdomen is soft.     Tenderness: There is no abdominal tenderness. There is no guarding or rebound.  Musculoskeletal:        General: Swelling present. No tenderness.     Comments: 2-3+ pitting edema to BLE  Skin:    General: Skin is warm and dry.  Neurological:     Mental Status: He is alert and oriented to person, place, and time.  Psychiatric:        Behavior: Behavior normal.     Comments: Anxious appearing      ED Treatments / Results  Labs (all labs ordered are listed, but only abnormal results are displayed) Labs Reviewed  BASIC METABOLIC PANEL - Abnormal; Notable for the following components:      Result Value   Sodium 134 (*)    Chloride 96 (*)    Glucose, Bld 238 (*)    BUN 22 (*)    Calcium 8.1 (*)    All other components within normal limits  CBC - Abnormal; Notable for the following components:   WBC 12.5 (*)    Hemoglobin 12.1 (*)    All other components within normal limits  URINALYSIS, ROUTINE W REFLEX MICROSCOPIC - Abnormal; Notable for the following components:   Color, Urine AMBER (*)    APPearance HAZY (*)    Glucose, UA 150 (*)    Hgb urine dipstick SMALL (*)    Ketones, ur 5 (*)    Protein, ur >=300 (*)    Bacteria, UA RARE (*)    All other components within normal limits  HEPATIC FUNCTION PANEL - Abnormal; Notable for the following components:   Total Protein 5.7 (*)    Albumin 2.4 (*)    AST 6 (*)    Alkaline Phosphatase 215 (*)    Bilirubin, Direct 0.4 (*)    All other components within normal limits  BRAIN NATRIURETIC PEPTIDE - Abnormal; Notable for the following components:   B Natriuretic Peptide 238.4 (*)    All other components within normal limits  GLUCOSE, CAPILLARY - Abnormal; Notable for the following components:   Glucose-Capillary 232 (*)    All other components within normal limits  EXPECTORATED SPUTUM ASSESSMENT W REFEX TO RESP CULTURE  GRAM STAIN  HIV  ANTIBODY (ROUTINE TESTING W REFLEX)  I-STAT TROPONIN, ED    EKG None  Radiology Dg Chest Port 1 View  Result Date: 08/23/2018 CLINICAL DATA:  Short of breath. EXAM: PORTABLE CHEST 1 VIEW COMPARISON:  112/03/202019 FINDINGS: The heart size and mediastinal contours are within normal limits. Large left  lower lobe lung mass with chest wall involvement is again noted and appears similar to previous exam. There is a left pleural effusion which appears increased in volume from previous study. Right lung appears clear. The visualized skeletal structures are unremarkable. IMPRESSION: 1. Interval increase in volume of left pleural effusion. 2. Similar appearance of large left lower lobe lung mass with chest wall involvement. Electronically Signed   By: Kerby Moors M.D.   On: 08/23/2018 12:19    Procedures Procedures (including critical care time)  Medications Ordered in ED Medications  gabapentin (NEURONTIN) capsule 300 mg (300 mg Oral Not Given 08/23/18 1539)  oxyCODONE-acetaminophen (PERCOCET) 7.5-325 MG per tablet 1 tablet (1 tablet Oral Given 08/23/18 1611)  enoxaparin (LOVENOX) injection 40 mg (has no administration in time range)  insulin aspart (novoLOG) injection 0-15 Units (5 Units Subcutaneous Given 08/23/18 1611)  insulin aspart (novoLOG) injection 0-5 Units (has no administration in time range)  cefTRIAXone (ROCEPHIN) 1 g in sodium chloride 0.9 % 100 mL IVPB (has no administration in time range)  azithromycin (ZITHROMAX) 500 mg in sodium chloride 0.9 % 250 mL IVPB (has no administration in time range)  acetaminophen (TYLENOL) tablet 650 mg (has no administration in time range)    Or  acetaminophen (TYLENOL) suppository 650 mg (has no administration in time range)  polyethylene glycol (MIRALAX / GLYCOLAX) packet 17 g (has no administration in time range)  ondansetron (ZOFRAN) tablet 4 mg (4 mg Oral Given 08/23/18 1552)    Or  ondansetron (ZOFRAN) injection 4 mg ( Intravenous See Alternative  08/23/18 1552)  albuterol (PROVENTIL) (2.5 MG/3ML) 0.083% nebulizer solution 2.5 mg (2.5 mg Nebulization Not Given 08/23/18 1452)  albuterol (PROVENTIL) (2.5 MG/3ML) 0.083% nebulizer solution 2.5 mg (has no administration in time range)  methylPREDNISolone sodium succinate (SOLU-MEDROL) 40 mg/mL injection 40 mg (has no administration in time range)  hydrochlorothiazide (MICROZIDE) capsule 12.5 mg (12.5 mg Oral Given 08/23/18 1552)  metoprolol tartrate (LOPRESSOR) tablet 25 mg (25 mg Oral Given 08/23/18 1552)  albuterol (PROVENTIL) (2.5 MG/3ML) 0.083% nebulizer solution 5 mg (5 mg Nebulization Given 08/23/18 1331)  methylPREDNISolone sodium succinate (SOLU-MEDROL) 125 mg/2 mL injection 125 mg (125 mg Intravenous Given 08/23/18 1344)     Initial Impression / Assessment and Plan / ED Course  I have reviewed the triage vital signs and the nursing notes.  Pertinent labs & imaging results that were available during my care of the patient were reviewed by me and considered in my medical decision making (see chart for details).    patient with history of cancer here for evaluation of significant shortness of breath. He is tachypnea on evaluation with absent air movement in left lung fields. He does have wheezing in the right lung fields. His symptoms are significantly improved after supplemental oxygen administration. He was treated with albuterol and steroids for possible COPD exacerbation. He had a mild improvement in his symptoms with albuterol with no significant change in his lung exam. Chest x-ray with increased left lung effusion. Medicine consulted for admission for hypoxic respiratory failure. Patient updated of findings of studies and recommendation for admission and he is in agreement with treatment plan. Final Clinical Impressions(s) / ED Diagnoses   Final diagnoses:  Hypoxia    ED Discharge Orders    None       Quintella Reichert, MD 08/23/18 [phone]

## 2018-08-23 NOTE — H&P (Signed)
History and Physical  Patient Name: Dylan Jacobs     KZS:[id number]    DOB: 1961-07-18    DOA: 08/23/2018 PCP: [name], MD  Patient coming from: Home  Chief Complaint: Cough, dyspnea      HPI: [name] is a 58 y.o. M with hx squamous NSCLC T4N0M0 just diagnosed 1 month ago, just started radiation and carboplatin/paclitaxel 3 weeks ago, HTN and DM no longer on therapy, remote TIA and smoking who presents with cough and dyspnea slowly progressive over 2-3 weeks.  The patient has had slowly progressive cough and dyspnea over the last few weeks.  No fever, but just new productive cough, grayish brownish sputum, malaise and now dyspnea.  The dyspnea is NOT positional, not worse with lying down, better with standing up.  He can only walk a few feet before he is out of breath.  His left flank pain is worsening and he couldn't sleep last night due to dyspnea, so he came in.  ED course: - Afebrile, heart rate 108, respirations 24, pulse ox 84% on room air blood pressure 163/68 -Na 134, K 4.0, Cr 0.9, WBC 12.5 K, Hgb 12.1 - Troponin negative - Chest x-ray showed known large left mass, but worsening left-sided effusion -ECG showed normal sinus rhythm - He was given nebs and Solu-Medrol and the hospital service were asked to evaluate for hypoxic respiratory failure     ROS: Review of Systems  Constitutional: Positive for malaise/fatigue. Negative for chills and fever.  Respiratory: Positive for cough, sputum production and shortness of breath. Negative for hemoptysis and wheezing.   Cardiovascular: Positive for chest pain and leg swelling. Negative for palpitations, orthopnea and PND.  All other systems reviewed and are negative.         Past Medical History:  Diagnosis Date  . Cancer (HCC)    Rib  . Diabetes mellitus without complication ([place])   . Fractured rib   . Hypertension   . Stroke ([place])   . Ulcer     History reviewed. No pertinent surgical  history.  Social History: Patient lives with his brother.  The patient walks unassisted.  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  Smoker.  No Known Allergies  Family history: family history includes ALS in his brother; COPD in his brother; Cancer in his brother; Prostate cancer in his father.  Prior to Admission medications   Medication Sig Start Date End Date Taking? Authorizing Provider  oxyCODONE-acetaminophen (PERCOCET) 7.5-325 MG tablet Take 1 tablet by mouth every 4 (four) hours as needed for severe pain. 08/22/18  Yes Gery Pray, MD  prochlorperazine (COMPAZINE) 10 MG tablet Take 1 tablet (10 mg total) by mouth every 6 (six) hours as needed for nausea or vomiting. 07/31/18  Yes Curt Bears, MD  gabapentin (NEURONTIN) 300 MG capsule Take one capsule today, take twice a day tomorrow, then take three times a day.  Take with meals 73/22/02   Delora Fuel, MD  potassium chloride 20 MEQ TBCR Take 40 mEq by mouth daily. 07/31/18   Curt Bears, MD       Physical Exam: BP (!) 170/82   Pulse (!) 104   Temp 98.1 F (36.7 C)   Resp 18   SpO2 95%  General appearance: Thin appearing adult male, alert and in no acute distress, on nasal cannula.   Eyes: Anicteric, conjunctiva pink, lids and lashes normal. PERRL.    ENT: No nasal deformity, discharge,  epistaxis.  Hearing normal. OP moist without lesions.  Mostly edentulous, lips normal Neck: No neck masses.  Trachea midline.  No thyromegaly/tenderness. Lymph: No cervical or supraclavicular lymphadenopathy. Skin: Warm and dry.  No jaundice.  No suspicious rashes or lesions. Cardiac: RRR, nl S1-S2, no murmurs appreciated.  Capillary refill is brisk.  JVP normal.  Plus bilateral LE edema.  Radial pulses 2+ and symmetric. Respiratory: Increased respiratory rate, lungs diminished on the left, no wheezes or rales on the right. Abdomen: Abdomen soft.  No TTP. No ascites, distension,  hepatosplenomegaly.   MSK: No deformities or effusions of the large joints of the upper or lower extremities bilaterally.  No cyanosis.  Clubbing of the fingernails is noted.  She is loss of subcutaneous muscle mass and fat. Neuro: Cranial nerves 2 through 12 intact.  Sensation intact to light touch. Speech is fluent.  Muscle strength 5/5 and symmetric.    Psych: Sensorium intact and responding to questions, attention normal.  Behavior appropriate.  Affect normal.  Judgment and insight appear normal.     Labs on Admission:  I have personally reviewed following labs and imaging studies: CBC: Recent Labs  Lab 08/23/18 1133  WBC 12.5*  HGB 12.1*  HCT 39.1  MCV 86.1  PLT 664   Basic Metabolic Panel: Recent Labs  Lab 08/23/18 1133  NA 134*  K 4.0  CL 96*  CO2 25  GLUCOSE 238*  BUN 22*  CREATININE 0.91  CALCIUM 8.1*   GFR: Estimated Creatinine Clearance: 97.5 mL/min (by C-G formula based on SCr of 0.91 mg/dL).  Liver Function Tests: Recent Labs  Lab 08/23/18 1149  AST 6*  ALT 10  ALKPHOS 215*  BILITOT 1.1  PROT 5.7*  ALBUMIN 2.4*       Radiological Exams on Admission: Personally reviewed CXR shows owrsening left sided effusion, large left sided mass: Dg Chest Port 1 View  Result Date: 08/23/2018 CLINICAL DATA:  Short of breath. EXAM: PORTABLE CHEST 1 VIEW COMPARISON:  129-Feb-202019 FINDINGS: The heart size and mediastinal contours are within normal limits. Large left lower lobe lung mass with chest wall involvement is again noted and appears similar to previous exam. There is a left pleural effusion which appears increased in volume from previous study. Right lung appears clear. The visualized skeletal structures are unremarkable. IMPRESSION: 1. Interval increase in volume of left pleural effusion. 2. Similar appearance of large left lower lobe lung mass with chest wall involvement. Electronically Signed   By: Kerby Moors M.D.   On: 08/23/2018 12:19    EKG:  Independently reviewed. Rate 92, QTc 416, LAFB, no St changes.       Assessment/Plan  Acute respiratory failure with hypoxia Likely from effusion, cancer, and superimposed pneumonia.  Doubt CHF, can't rule out bronchitis.     Pleural effusion -Check LDH -Obtain US for thoracentesis and obtain culture, gram stain, cytology, LDH and protein if fluid removed -Check BNP, if elevated advance to Echo  Community-acquired pneumonia  PSI 119, risk class IV.   -Ceftriaxone and azithromycin -Obtain sputum culture -Obtain blood culture -Flutter and IS  Possible COPD exacerbation/bronchitis No previous history of COPD or emphysema, but reportedly wheezing for EDP, and is lifelong smoker. -Solu-medrol 40 BID -Nebulized bronchodilators, scheduled and PRN  Hypertension Poorly controlled -Restart HCTZ and metoprolol  Diabetes -Sliding scale correction insulin  Lung cancer -Consult oncology     DVT prophylaxis: Lovevnox  Code Status: FULL  Family Communication: Brother at bedside  Disposition Plan: Anticipate IV  antbiotics, thoracentesis.  Wean O2 as able. Consults called: Oncology added to team via Epic Admission status: INAPTIENT   At the time of admission, it appears that the appropriate admission status for this patient is INPATIENT. This is judged to be reasonable and necessary in order to provide the required intensity of service to ensure the patient's safety given: -presenting symptoms of dyspnea -physical exam findings of tachypnea >24, tachycardia, and  -initial radiographic and laboratory data leukocytosis, CXR showing new effusion, large lung mass -in the context of their chronic comorbidities recently initiated chemo and T4N0M0 NSCLC    Together, these circumstances are felt to place him at high risk for further clinical deterioration threatening life, limb, or organ requiring a high intensity of service due to this acute illness that poses a threat to life, limb  or bodily function.  I certify that at the point of admission it is my clinical judgment that the patient will require inpatient hospital care spanning beyond 2 midnights from the point of admission and that early discharge would result in unnecessary risk of decompensation and readmission or threat to life, limb or bodily function.   Medical decision making: Patient seen at 2:48 PM on 08/23/2018.  The patient was discussed with Dr. Ralene Bathe.  What exists of the patient's chart was reviewed in depth and summarized above.  Clinical condition: stable with supplemental O2.        [facility] Pager: please page via [email or web address], enter password "[email or web address]", and identify attending under [facility]

## 2018-08-23 NOTE — ED Triage Notes (Signed)
Pt is in week three of his cancer treatment. Pt c/o shortness of breath x 12 hours. Nausea since first treatment.

## 2018-08-23 NOTE — Care Management (Signed)
Referral received for oxygen and self pay. Contacted [facility] and they are not able to arrange home oxygen from ED and self pay. Updated attending. Will continue to follow for dc needs. Jonnie Finner RN CCM Case Mgmt phone [phone]

## 2018-08-23 NOTE — ED Triage Notes (Signed)
Pt reports he had his last chemo therapy on Friday and today he started feeling weak and dizzy. Pt's O2 saturation is at 80% on RA and pt is pale in triage. Pt reports he smokes cigarettes daily. Pt reports nausea with dizziness.

## 2018-08-24 ENCOUNTER — Inpatient Hospital Stay (HOSPITAL_COMMUNITY): Payer: Self-pay

## 2018-08-24 ENCOUNTER — Telehealth: Payer: Self-pay | Admitting: Medical Oncology

## 2018-08-24 ENCOUNTER — Inpatient Hospital Stay: Payer: Self-pay

## 2018-08-24 ENCOUNTER — Ambulatory Visit
Admission: RE | Admit: 2018-08-24 | Discharge: 2018-08-24 | Disposition: A | Discharge status: Home / Self Care | Payer: Self-pay | Source: Ambulatory Visit | Attending: Radiation Oncology | Admitting: Radiation Oncology

## 2018-08-24 ENCOUNTER — Inpatient Hospital Stay: Payer: Self-pay | Attending: Internal Medicine

## 2018-08-24 DIAGNOSIS — R0609 Other forms of dyspnea: Secondary | ICD-10-CM

## 2018-08-24 DIAGNOSIS — C3432 Malignant neoplasm of lower lobe, left bronchus or lung: Secondary | ICD-10-CM | POA: Insufficient documentation

## 2018-08-24 DIAGNOSIS — C7989 Secondary malignant neoplasm of other specified sites: Secondary | ICD-10-CM | POA: Insufficient documentation

## 2018-08-24 DIAGNOSIS — Z5111 Encounter for antineoplastic chemotherapy: Secondary | ICD-10-CM | POA: Insufficient documentation

## 2018-08-24 LAB — CBC
HCT: 41.3 % (ref 39.0–52.0)
Hemoglobin: 12.6 g/dL — ABNORMAL LOW (ref 13.0–17.0)
MCH: 26.8 pg (ref 26.0–34.0)
MCHC: 30.5 g/dL (ref 30.0–36.0)
MCV: 87.9 fL (ref 80.0–100.0)
Platelets: 278 10*3/uL (ref 150–400)
RBC: 4.7 MIL/uL (ref 4.22–5.81)
RDW: 14.4 % (ref 11.5–15.5)
WBC: 12.3 10*3/uL — ABNORMAL HIGH (ref 4.0–10.5)
nRBC: 0 % (ref 0.0–0.2)

## 2018-08-24 LAB — BASIC METABOLIC PANEL
Anion gap: 13 (ref 5–15)
BUN: 28 mg/dL — ABNORMAL HIGH (ref 6–20)
CALCIUM: 8.3 mg/dL — AB (ref 8.9–10.3)
CO2: 26 mmol/L (ref 22–32)
CREATININE: 1.07 mg/dL (ref 0.61–1.24)
Chloride: 97 mmol/L — ABNORMAL LOW (ref 98–111)
GFR calc Af Amer: >60 mL/min (ref >60)
GFR calc non Af Amer: >60 mL/min (ref >60)
Glucose, Bld: 334 mg/dL — ABNORMAL HIGH (ref 70–99)
Potassium: 5.4 mmol/L — ABNORMAL HIGH (ref 3.5–5.1)
Sodium: 136 mmol/L (ref 135–145)

## 2018-08-24 LAB — HEMOGLOBIN A1C
Hgb A1c MFr Bld: 8 % — ABNORMAL HIGH (ref 4.8–5.6)
Mean Plasma Glucose: 182.9 mg/dL

## 2018-08-24 LAB — ECHOCARDIOGRAM COMPLETE
Height: 69 in
WEIGHTICAEL: 3014.4 [oz_av]

## 2018-08-24 LAB — GLUCOSE, CAPILLARY
GLUCOSE-CAPILLARY: 130 mg/dL — AB (ref 70–99)
Glucose-Capillary: 284 mg/dL — ABNORMAL HIGH (ref 70–99)
Glucose-Capillary: 413 mg/dL — ABNORMAL HIGH (ref 70–99)
Glucose-Capillary: 184 mg/dL — ABNORMAL HIGH (ref 70–99)

## 2018-08-24 LAB — LACTATE DEHYDROGENASE: LDH: 183 U/L (ref 98–192)

## 2018-08-24 LAB — PROCALCITONIN: Procalcitonin: 1.26 ng/mL

## 2018-08-24 LAB — HIV ANTIBODY (ROUTINE TESTING W REFLEX): HIV Screen 4th Generation wRfx: NONREACTIVE

## 2018-08-24 MED ORDER — ALPRAZOLAM 0.5 MG PO TABS: 0.5000 mg | ORAL_TABLET | Freq: Three times a day (TID) | ORAL | Status: DC | PRN

## 2018-08-24 MED ORDER — LIDOCAINE HCL 1 % IJ SOLN
INTRAMUSCULAR | Status: AC
  Filled 2018-08-24: qty 20

## 2018-08-24 MED ORDER — LIVING WELL WITH DIABETES BOOK
Freq: Once | Status: AC
  Administered 2018-08-24: 17:00:00
  Filled 2018-08-24: qty 1

## 2018-08-24 MED ORDER — HYDROCHLOROTHIAZIDE 25 MG PO TABS
25.0000 mg | ORAL_TABLET | Freq: Every day | ORAL | Status: DC
  Administered 2018-08-24 – 2018-08-25 (×2): 25 mg via ORAL
  Filled 2018-08-24 (×2): qty 1

## 2018-08-24 MED ORDER — METHYLPREDNISOLONE SODIUM SUCC 40 MG IJ SOLR: 40.0000 mg | INTRAMUSCULAR | Status: DC

## 2018-08-24 MED ORDER — AMLODIPINE BESYLATE 5 MG PO TABS
5.0000 mg | ORAL_TABLET | Freq: Every day | ORAL | Status: DC
  Administered 2018-08-24 – 2018-08-25 (×2): 5 mg via ORAL
  Filled 2018-08-24 (×2): qty 1

## 2018-08-24 MED ORDER — IOPAMIDOL (ISOVUE-370) INJECTION 76%
INTRAVENOUS | Status: AC
  Filled 2018-08-24: qty 100

## 2018-08-24 MED ORDER — SODIUM CHLORIDE (PF) 0.9 % IJ SOLN
INTRAMUSCULAR | Status: AC
  Filled 2018-08-24: qty 50

## 2018-08-24 MED ORDER — IOPAMIDOL (ISOVUE-370) INJECTION 76%
100.0000 mL | Freq: Once | INTRAVENOUS | Status: AC | PRN
  Administered 2018-08-24: 100 mL via INTRAVENOUS

## 2018-08-24 MED ORDER — IPRATROPIUM-ALBUTEROL 0.5-2.5 (3) MG/3ML IN SOLN
3.0000 mL | Freq: Three times a day (TID) | RESPIRATORY_TRACT | Status: DC
  Administered 2018-08-24 – 2018-08-25 (×2): 3 mL via RESPIRATORY_TRACT
  Filled 2018-08-24 (×2): qty 3

## 2018-08-24 NOTE — Progress Notes (Signed)
Patient ID: Dylan Jacobs, male   DOB: [birth year], 58 y.o.   MRN: [id number] Pt presented to [place] dept today for left thoracentesis. On limited US left post chest there is not a safely accessible /sig amount of free fluid to aspirate. Lung mass /atelectatic changes noted. Procedure cancelled. Consider f/u CT chest.

## 2018-08-24 NOTE — Progress Notes (Signed)
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  There is low suspicion for any cardiac etiology of his cough and shortness of breath. -Pending thoracentesis -We will order CTA of chest -Continue IV ceftriaxone and azithromycin started 08/23/2018 for possible postobstructive pneumonia -Procalcitonin ordered -Follow blood cultures ordered 08/23/2018 -Continue IV methylprednisolone 40 mg daily -Continue PRN bronchodilators  #) Type 2 diabetes: Currently not on any treatment for this -Sliding scale insulin, AC at bedtime  #) Hypertension: Not on any treatment for this -Monitor  #)pain: -Continue gabapentin 300 mg 3 times daily  Fluids: Tolerating p.o. Electrolytes: Monitor and supplement Nutrition: Carb restricted diet   Prophylaxis: Enoxaparin  Disposition: Pending resolution of oxygen requirement  Full code      Consultants:   Interventional radiology  Procedures:   None  Antimicrobials:   IV ceftriaxone and azithromycin started 08/23/2018   Subjective: This morning patient reports he feels somewhat better.  He denies any nausea, vomiting, diarrhea.  He has not had any  chest, untreated diabetes.  He continues to report intermittent shortness of breath and dyspnea on exertion.  Objective: Vitals:   08/23/18 2017 08/23/18 2157 08/24/18 0434 08/24/18 0859  BP:  (!) 157/75 (!) 166/83   Pulse:  91 85   Resp:  20 16   Temp:  98.3 F (36.8 C) 98.9 F (37.2 C)   TempSrc:  Oral Oral   SpO2: 95% 96% 96% 95%  Weight:      Height:        Intake/Output Summary (Last 24 hours) at 08/24/2018 1030 Last data filed at 08/24/2018 0500 Gross per 24 hour  Intake 764.33 ml  Output 150 ml  Net 614.33 ml   Filed Weights   08/23/18 1535  Weight: 85.5 kg    Examination:  General exam: Appears calm and comfortable  Respiratory system: No increased work of breathing, diminished lung sounds at left lower base, no wheezes or crackles Cardiovascular system: Regular rate and rhythm, no murmurs Gastrointestinal system: Soft, nondistended, no rebound or guarding, plus bowel sounds Central nervous system: Alert and oriented.  Grossly intact, moving all extremities Extremities: 1+ lower extremity edema Skin: Port site is clean dry and intact Psychiatry: Judgement and insight appear normal. Mood & affect appropriate.     Data Reviewed: I have personally reviewed following labs and imaging studies  CBC: Recent Labs  Lab 08/23/18 1133 08/24/18 0439  WBC 12.5* 12.3*  HGB 12.1* 12.6*  HCT 39.1 41.3  MCV 86.1 87.9  PLT 323 323   Basic Metabolic Panel: Recent Labs  Lab 08/23/18 1133 08/24/18 0439  NA 134* 136  K 4.0 5.4*  CL 96* 97*  CO2 25 26  GLUCOSE 238* 334*  BUN 22* 28*  CREATININE 0.91  1.07  CALCIUM 8.1* 8.3*   GFR: Estimated Creatinine Clearance: 80.5 mL/min (by C-G formula based on SCr of 1.07 mg/dL). Liver Function Tests: Recent Labs  Lab 08/23/18 1149  AST 6*  ALT 10  ALKPHOS 215*  BILITOT 1.1  PROT 5.7*  ALBUMIN 2.4*   No results for input(s): LIPASE, AMYLASE in the last 168 hours. No results for input(s): AMMONIA in the last 168  hours. Coagulation Profile: No results for input(s): INR, PROTIME in the last 168 hours. Cardiac Enzymes: No results for input(s): CKTOTAL, CKMB, CKMBINDEX, TROPONINI in the last 168 hours. BNP (last 3 results) No results for input(s): PROBNP in the last 8760 hours. HbA1C: Recent Labs    08/24/18 0439  HGBA1C 8.0*   CBG: Recent Labs  Lab 08/23/18 1550 08/23/18 2156 08/24/18 0724  GLUCAP 232* 335* 284*   Lipid Profile: No results for input(s): CHOL, HDL, LDLCALC, TRIG, CHOLHDL, LDLDIRECT in the last 72 hours. Thyroid Function Tests: No results for input(s): TSH, T4TOTAL, FREET4, T3FREE, THYROIDAB in the last 72 hours. Anemia Panel: No results for input(s): VITAMINB12, FOLATE, FERRITIN, TIBC, IRON, RETICCTPCT in the last 72 hours. Sepsis Labs: No results for input(s): PROCALCITON, LATICACIDVEN in the last 168 hours.  Recent Results (from the past 240 hour(s))  Culture, sputum-assessment     Status: None   Collection Time: 08/23/18  8:00 PM  Result Value Ref Range Status   Specimen Description SPUTUM  Final   Special Requests NONE  Final   Sputum evaluation   Final    THIS SPECIMEN IS ACCEPTABLE FOR SPUTUM CULTURE Performed at [facility], [place] [address] [place], [place] [address]    Report Status 08/23/2018 FINAL  Final  Culture, respiratory     Status: None (Preliminary result)   Collection Time: 08/23/18  8:00 PM  Result Value Ref Range Status   Specimen Description   Final    SPUTUM Performed at [facility], [place] [address] [place], [place] [address]    Special Requests   Final    NONE Reflexed from [phone] Performed at [facility] [address] [place], [place] [address]    Gram Stain   Final    MODERATE WBC PRESENT, PREDOMINANTLY PMN ABUNDANT GRAM POSITIVE COCCI IN PAIRS ABUNDANT GRAM NEGATIVE RODS FEW GRAM POSITIVE RODS Performed at [facility], [place] [address] [place],   [address]    Culture PENDING  Incomplete   Report Status PENDING  Incomplete         Radiology Studies: Dg Chest Port 1 View  Result Date: 08/23/2018 CLINICAL DATA:  Short of breath. EXAM: PORTABLE CHEST 1 VIEW COMPARISON:  101/06/202019 FINDINGS: The heart size and mediastinal contours are within normal limits. Large left lower lobe lung mass with chest wall involvement is again noted and appears similar to previous exam. There is a left pleural effusion which appears increased in volume from previous study. Right lung appears clear. The visualized skeletal structures are unremarkable. IMPRESSION: 1. Interval increase in volume of left pleural effusion. 2. Similar appearance of large left lower lobe lung mass with chest wall involvement. Electronically Signed   By: Kerby Moors M.D.   On: 08/23/2018 12:19        Scheduled Meds: . amLODipine  5 mg Oral Daily  . enoxaparin (LOVENOX) injection  40 mg Subcutaneous Q24H  . feeding supplement (ENSURE ENLIVE)  237 mL Oral BID BM  . gabapentin  300 mg Oral TID  .  hydrochlorothiazide  25 mg Oral Daily  . insulin aspart  0-15 Units Subcutaneous TID WC  . insulin aspart  0-5 Units Subcutaneous QHS  . ipratropium-albuterol  3 mL Nebulization TID  . lidocaine      . [START ON 08/25/2018] methylPREDNISolone (SOLU-MEDROL) injection  40 mg Intravenous Q24H   Continuous Infusions: . azithromycin Stopped (08/23/18 1956)  . cefTRIAXone (ROCEPHIN)  IV Stopped (08/23/18 1838)     LOS: 1 day    Time spent: [place], MD [facility] If 7PM-7AM, please contact night-coverage [email or web address] Password [email or web address] 08/24/2018, 10:30 AM

## 2018-08-24 NOTE — Progress Notes (Signed)
Inpatient Diabetes Program Recommendations  AACE/ADA: New Consensus Statement on Inpatient Glycemic Control (2015)  Target Ranges:  Prepandial:   less than 140 mg/dL      Peak postprandial:   less than 180 mg/dL (1-2 hours)      Critically ill patients:  140 - 180 mg/dL   Lab Results  Component Value Date   GLUCAP 413 (H) 08/24/2018   HGBA1C 8.0 (H) 08/24/2018    Review of Glycemic Control  Diabetes history: DM2 Outpatient Diabetes medications: None Current orders for Inpatient glycemic control: Novolog 0-15 units tidwc and hs  HgbA1C - 8%  On Ensure Enlive 237 ml bid Received Solumedrol 125 mg on 01/05 and 40 mg on 01/06 at 0126.  Received 23 units Novolog thus far today.  Pt states he was on metformin and amaryl years ago. States he will be compliant with whatever he needs to be on or take at home. Discussed glucose monitoring 3-4x/day and f/u with PCP for diabetes management.   Inpatient Diabetes Program Recommendations:   (for discharge)  Will order Living Well with Diabetes book. Glucose monitoring 3-4x/day and f/u with PCP. Would not add metformin or OHA to risk GI upset or hypoglycemia with irregular eating patterns.  Ensure Enlive has 45 g CHO per 8 oz.  Will need prescription for glucose meter, strips and lancets at discharge.  Will f/u in am.  Thank you. Lorenda Peck, RD, LDN, CDE Inpatient Diabetes Coordinator [phone]

## 2018-08-24 NOTE — Telephone Encounter (Signed)
Admitted - when does Dylan Jacobs want to see him again? I told [name] 08/31/18.

## 2018-08-24 NOTE — Progress Notes (Signed)
  Echocardiogram 2D Echocardiogram has been performed.  Dylan Jacobs 08/24/2018, 2:27 PM

## 2018-08-25 ENCOUNTER — Ambulatory Visit
Admission: RE | Admit: 2018-08-25 | Discharge: 2018-08-25 | Disposition: A | Discharge status: Home / Self Care | Payer: Self-pay | Source: Ambulatory Visit | Attending: Radiation Oncology | Admitting: Radiation Oncology

## 2018-08-25 DIAGNOSIS — J9621 Acute and chronic respiratory failure with hypoxia: Principal | ICD-10-CM

## 2018-08-25 LAB — BASIC METABOLIC PANEL
Anion gap: 12 (ref 5–15)
BUN: 30 mg/dL — ABNORMAL HIGH (ref 6–20)
CO2: 25 mmol/L (ref 22–32)
Calcium: 7.9 mg/dL — ABNORMAL LOW (ref 8.9–10.3)
Chloride: 96 mmol/L — ABNORMAL LOW (ref 98–111)
Creatinine, Ser: 0.94 mg/dL (ref 0.61–1.24)
Glucose, Bld: 191 mg/dL — ABNORMAL HIGH (ref 70–99)
Potassium: 4.1 mmol/L (ref 3.5–5.1)
Sodium: 133 mmol/L — ABNORMAL LOW (ref 135–145)

## 2018-08-25 LAB — CBC
HCT: 38.1 % — ABNORMAL LOW (ref 39.0–52.0)
Hemoglobin: 12.2 g/dL — ABNORMAL LOW (ref 13.0–17.0)
MCH: 26.5 pg (ref 26.0–34.0)
MCHC: 32 g/dL (ref 30.0–36.0)
MCV: 82.8 fL (ref 80.0–100.0)
Platelets: 259 10*3/uL (ref 150–400)
RBC: 4.6 MIL/uL (ref 4.22–5.81)
RDW: 14.4 % (ref 11.5–15.5)
WBC: 15.3 10*3/uL — ABNORMAL HIGH (ref 4.0–10.5)
nRBC: 0 % (ref 0.0–0.2)

## 2018-08-25 LAB — BASIC METABOLIC PANEL WITH GFR
GFR calc Af Amer: >60 mL/min (ref >60)
GFR calc non Af Amer: >60 mL/min (ref >60)

## 2018-08-25 LAB — GLUCOSE, CAPILLARY
Glucose-Capillary: 194 mg/dL — ABNORMAL HIGH (ref 70–99)
Glucose-Capillary: 172 mg/dL — ABNORMAL HIGH (ref 70–99)

## 2018-08-25 LAB — PROCALCITONIN: Procalcitonin: 1.57 ng/mL

## 2018-08-25 MED ORDER — SITAGLIPTIN PHOS-METFORMIN HCL 50-1000 MG PO TABS: 1.0000 | ORAL_TABLET | Freq: Two times a day (BID) | ORAL | 0 refills | Status: AC

## 2018-08-25 MED ORDER — HYDROCHLOROTHIAZIDE 25 MG PO TABS: 25.0000 mg | ORAL_TABLET | Freq: Every day | ORAL | 0 refills | Status: AC

## 2018-08-25 MED ORDER — DOXYCYCLINE HYCLATE 100 MG PO TABS: 100.0000 mg | ORAL_TABLET | Freq: Two times a day (BID) | ORAL | 0 refills | Status: AC

## 2018-08-25 MED ORDER — DOXYCYCLINE HYCLATE 100 MG PO TABS
100.0000 mg | ORAL_TABLET | Freq: Two times a day (BID) | ORAL | Status: DC
  Administered 2018-08-25: 100 mg via ORAL
  Filled 2018-08-25: qty 1

## 2018-08-25 MED ORDER — AZITHROMYCIN 250 MG PO TABS
500.0000 mg | ORAL_TABLET | Freq: Every day | ORAL | Status: DC
  Administered 2018-08-25: 500 mg via ORAL
  Filled 2018-08-25: qty 2

## 2018-08-25 MED ORDER — AMLODIPINE BESYLATE 5 MG PO TABS: 5.0000 mg | ORAL_TABLET | Freq: Every day | ORAL | 0 refills | Status: AC

## 2018-08-25 MED ORDER — ALPRAZOLAM 0.5 MG PO TABS: 0.5000 mg | ORAL_TABLET | Freq: Three times a day (TID) | ORAL | 0 refills | Status: DC | PRN

## 2018-08-25 NOTE — Discharge Instructions (Signed)
Community-Acquired Pneumonia, Adult  Pneumonia is an infection of the lungs. It causes swelling in the airways of the lungs. Mucus and fluid may also build up inside the airways.  One type of pneumonia can happen while a person is in a hospital. A different type can happen when a person is not in a hospital (community-acquired pneumonia).   What are the causes?    This condition is caused by germs (viruses, bacteria, or fungi). Some types of germs can be passed from one person to another. This can happen when you breathe in droplets from the cough or sneeze of an infected person.  What increases the risk?  You are more likely to develop this condition if you:   Have a long-term (chronic) disease, such as:  ? Chronic obstructive pulmonary disease (COPD).  ? Asthma.  ? Cystic fibrosis.  ? Congestive heart failure.  ? Diabetes.  ? Kidney disease.   Have HIV.   Have sickle cell disease.   Have had your spleen removed.   Do not take good care of your teeth and mouth (poor dental hygiene).   Have a medical condition that increases the risk of breathing in droplets from your own mouth and nose.   Have a weakened body defense system (immune system).   Are a smoker.   Travel to areas where the germs that cause this illness are common.   Are around certain animals or the places they live.  What are the signs or symptoms?   A dry cough.   A wet (productive) cough.   Fever.   Sweating.   Chest pain. This often happens when breathing deeply or coughing.   Fast breathing or trouble breathing.   Shortness of breath.   Shaking chills.   Feeling tired (fatigue).   Muscle aches.  How is this treated?  Treatment for this condition depends on many things. Most adults can be treated at home. In some cases, treatment must happen in a hospital. Treatment may include:   Medicines given by mouth or through an IV tube.   Being given extra oxygen.   Respiratory therapy.  In rare cases, treatment for very bad pneumonia  may include:   Using a machine to help you breathe.   Having a procedure to remove fluid from around your lungs.  Follow these instructions at home:  Medicines   Take over-the-counter and prescription medicines only as told by your doctor.  ? Only take cough medicine if you are losing sleep.   If you were prescribed an antibiotic medicine, take it as told by your doctor. Do not stop taking the antibiotic even if you start to feel better.  General instructions     Sleep with your head and neck raised (elevated). You can do this by sleeping in a recliner or by putting a few pillows under your head.   Rest as needed. Get at least 8 hours of sleep each night.   Drink enough water to keep your pee (urine) pale yellow.   Eat a healthy diet that includes plenty of vegetables, fruits, whole grains, low-fat dairy products, and lean protein.   Do not use any products that contain nicotine or tobacco. These include cigarettes, e-cigarettes, and chewing tobacco. If you need help quitting, ask your doctor.   Keep all follow-up visits as told by your doctor. This is important.  How is this prevented?  A shot (vaccine) can help prevent pneumonia. Shots are often suggested for:   People   older than 60 years of age.   People older than 60 years of age who:  ? Are having cancer treatment.  ? Have long-term (chronic) lung disease.  ? Have problems with their body's defense system.  You may also prevent pneumonia if you take these actions:   Get the flu (influenza) shot every year.   Go to the dentist as often as told.   Wash your hands often. If you cannot use soap and water, use hand sanitizer.  Contact a doctor if:   You have a fever.   You lose sleep because your cough medicine does not help.  Get help right away if:   You are short of breath and it gets worse.   You have more chest pain.   Your sickness gets worse. This is very serious if:  ? You are an older adult.  ? Your body's defense system is weak.   You  cough up blood.  Summary   Pneumonia is an infection of the lungs.   Most adults can be treated at home. Some will need treatment in a hospital.   Drink enough water to keep your pee pale yellow.   Get at least 8 hours of sleep each night.  This information is not intended to replace advice given to you by your health care provider. Make sure you discuss any questions you have with your health care provider.  Document Released: 01/22/2008 Document Revised: 04/02/2018 Document Reviewed: 04/02/2018  Elsevier Interactive Patient Education  2019 [employer]

## 2018-08-25 NOTE — Progress Notes (Signed)
Nutrition Note  Patient identified on the Malnutrition Screening Tool (MST) Report  Patient getting ready to be discharged. Pt with no diet related questions at this time. Encouraged consistent meal intakes with adequate amounts of protein foods. Pt states his appetite was poor here at the hospital but he is planning to go eat lunch at [employer] after discharge. Encouraged pt to request RD at next [place] appointment.   Wt Readings from Last 15 Encounters:  08/23/18 85.5 kg  08/17/18 91.2 kg  08/05/18 86.2 kg  07/31/18 86.6 kg  07/17/18 88.5 kg  07/10/18 88.5 kg  11/11/15 100.6 kg  05/30/14 101 kg  06/16/13 104.3 kg  03/02/12 106.6 kg  12/29/11 108 kg    Body mass index is 27.82 kg/m. Patient meets criteria for overweight based on current BMI.   Current diet order is CHO modified, patient is consuming approximately 0% of meals at this time. Labs and medications reviewed.   No nutrition interventions warranted at this time given that pt is about to leave hospital. If nutrition issues arise, please consult RD.   Clayton Bibles, MS, RD, [place] Dietitian Pager: [phone] After Hours Pager: [phone]

## 2018-08-25 NOTE — Progress Notes (Signed)
Pt discharged home today per Dr. Herbert Moors. Pt's IV site D/C'd and WDL. Pt's VSS. Pt provided with home medication list, discharge instructions and prescriptions. Verbalized understanding. Pt left floor via WC in stable condition accompanied by RN.

## 2018-08-25 NOTE — Progress Notes (Signed)
Inpatient Diabetes Program Recommendations  AACE/ADA: New Consensus Statement on Inpatient Glycemic Control (2015)  Target Ranges:  Prepandial:   less than 140 mg/dL      Peak postprandial:   less than 180 mg/dL (1-2 hours)      Critically ill patients:  140 - 180 mg/dL   Lab Results  Component Value Date   GLUCAP 194 (H) 08/25/2018   HGBA1C 8.0 (H) 08/24/2018    Review of Glycemic Control  Blood sugars improved this am. Reviewed glucose monitoring x 3/day and f/u with PCP. Discussed hypoglycemia s/s and treatment. Instructed to call MD if blood sugars consistently > 250 mg/dL. Answered questions.   Thank you. Lorenda Peck, RD, LDN, CDE Inpatient Diabetes Coordinator [phone]

## 2018-08-25 NOTE — Discharge Summary (Signed)
Physician Discharge Summary  Dylan Jacobs DDU:[id number] DOB: [birth year] DOA: 08/23/2018  PCP: [name], MD  Admit date: 08/23/2018 Discharge date: 08/25/2018  Admitted From: Home Disposition:  Home  Recommendations for Outpatient Follow-up:  1. Follow up with PCP in 1-2 weeks 2. Please obtain BMP/CBC in one week   Home Health: No Equipment/Devices:2lnc  Discharge Condition: stable CODE STATUS: FULL Diet recommendation: Heart Healthy / Carb Modified  Brief/Interim Summary:  #) Acute hypoxic respiratory failure: Patient was admitted with dyspnea and found to have hypoxia.  Chest x-ray showed known squamous cell cancer of the left lung as well as small pleural effusion.  Thoracentesis was attempted with interventional radiology but pleural effusion was felt to be loculated.  CTA pulmonary embolism protocol showed no evidence of PE but did show new cavitary lesions concerning for inflammatory changes in the left lung.  This was felt to possibly represent a pneumonia.  Patient was initially started on IV ceftriaxone and azithromycin as his procalcitonin was elevated.  Patient was discharged on 7 days of doxycycline.  Patient could not be weaned from oxygen and so was sent home on 2 L nasal cannula.  #) Hemoptysis: Patient was admitted with small volume hemoptysis as well.  Discussed with oncology and this was felt to likely be from his cancer would benefit from additional treatment.  Patient was scheduled to start radiation and this was continued.  #) Anxiety: Patient was given PRN alprazolam.  #) Hypertension: Patient was noted to be significantly hypertensive during this hospitalization.  He was discharged home on amlodipine and HCTZ.  #) Type 2 diabetes: Patient was maintained on sliding scale here while he was given IV steroids.  His hemoglobin A1c was 8.0. He was also started on sitagliptin and metformin.  #) Pains: Patient was continued on gabapentin 300 mg 3 times  daily.  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)   Pleural effusion   Community acquired pneumonia    Discharge Instructions  Discharge Instructions    Call MD for:  difficulty breathing, headache or visual disturbances   Complete by:  As directed    Call MD for:  hives   Complete by:  As directed    Call MD for:  persistant dizziness or light-headedness   Complete by:  As directed    Call MD for:  persistant nausea and vomiting   Complete by:  As directed    Call MD for:  redness, tenderness, or signs of infection (pain, swelling, redness, odor or green/yellow discharge around incision site)   Complete by:  As directed    Call MD for:  severe uncontrolled pain   Complete by:  As directed    Call MD for:  temperature >100.4   Complete by:  As directed    Diet - low sodium heart healthy   Complete by:  As directed    Discharge instructions   Complete by:  As directed    Please take your antibiotics as prescribed.  Please follow-up with your primary care doctor in 1 week.   For home use only DME oxygen   Complete by:  As directed    Mode or (Route):  Nasal cannula   Liters per Minute:  2   Frequency:  Continuous (stationary and portable oxygen unit needed)   Oxygen  conserving device:  Yes   Oxygen delivery system:  Gas   Increase activity slowly   Complete by:  As directed      Allergies as of 08/25/2018   No Known Allergies     Medication List    TAKE these medications   ALPRAZolam 0.5 MG tablet Commonly known as:  XANAX Take 1 tablet (0.5 mg total) by mouth 3 (three) times daily as needed for anxiety.   amLODipine 5 MG tablet Commonly known as:  NORVASC Take 1 tablet (5 mg total) by mouth daily. Start taking on:  August 26, 2018   doxycycline 100 MG tablet Commonly known as:   VIBRA-TABS Take 1 tablet (100 mg total) by mouth 2 (two) times daily for 7 days.   gabapentin 300 MG capsule Commonly known as:  NEURONTIN Take one capsule today, take twice a day tomorrow, then take three times a day.  Take with meals   hydrochlorothiazide 25 MG tablet Commonly known as:  HYDRODIURIL Take 1 tablet (25 mg total) by mouth daily. Start taking on:  August 26, 2018   oxyCODONE-acetaminophen 7.5-325 MG tablet Commonly known as:  PERCOCET Take 1 tablet by mouth every 4 (four) hours as needed for severe pain.   Potassium Chloride ER 20 MEQ Tbcr Take 40 mEq by mouth daily.   prochlorperazine 10 MG tablet Commonly known as:  COMPAZINE Take 1 tablet (10 mg total) by mouth every 6 (six) hours as needed for nausea or vomiting.   sitaGLIPtin-metformin 50-1000 MG tablet Commonly known as:  JANUMET Take 1 tablet by mouth 2 (two) times daily with a meal.            Durable Medical Equipment  (From admission, onward)         Start     Ordered   08/25/18 0000  For home use only DME oxygen    Question Answer Comment  Mode or (Route) Nasal cannula   Liters per Minute 2   Frequency Continuous (stationary and portable oxygen unit needed)   Oxygen conserving device Yes   Oxygen delivery system Gas      08/25/18 1037          No Known Allergies  Consultations:  Oncology Dr. Rogue Jury   Procedures/Studies: Ct Angio Chest Pe W Or Wo Contrast  Result Date: 08/24/2018 CLINICAL DATA:  Evaluate for pulmonary embolus. Hemoptysis. Cough and SOB. EXAM: CT ANGIOGRAPHY CHEST WITH CONTRAST TECHNIQUE: Multidetector CT imaging of the chest was performed using the standard protocol during bolus administration of intravenous contrast. Multiplanar CT image reconstructions and MIPs were obtained to evaluate the vascular anatomy. CONTRAST:  161mL ISOVUE-370 IOPAMIDOL (ISOVUE-370) INJECTION 76% COMPARISON:  PET-CT 08/05/2018 FINDINGS: Cardiovascular: There is mild cardiac  enlargement. No pericardial effusion. Aortic atherosclerosis. Calcification in the LAD and RCA coronary artery identified. Main pulmonary artery appears patent. No saddle embolus. There is no lobar or segmental pulmonary artery filling defects identified. Mediastinum/Nodes: Normal appearance of the thyroid gland. The trachea appears patent and is midline. No axillary or supraclavicular lymph nodes. No mediastinal or right hilar adenopathy. Lungs/Pleura: Loculated left pleural effusion is again noted. On today's study this measures 5.1 by 12.2 cm. This is compared with 3.2 x 10.4 cm previously. The large necrotic left lower lobe lung mass with chest wall involvement is again identified. This measures 13.4 by 10.8 cm, image 56/4. On the previous exam this measured 14.2 x 13.0 cm. There has been progressive internal necrosis and cavitation when compared with the  previous PET-CT. Significant atelectasis of the non involved portions of the medial left lower lobe noted. There are multiple new cavitary lung nodules identified throughout the left upper lobe compared with 08/05/2018. The largest measures 1.9 cm, image 42/6. There is no right pleural effusion. Patchy areas of ground-glass attenuation are identified throughout the motion obscured right lung. Upper Abdomen: No acute abnormality. Musculoskeletal: There is extensive bony destruction involving the left lateral ribs secondary to chest wall involvement by left lung mass. Similar to previous exam. Review of the MIP images confirms the above findings. IMPRESSION: 1. No evidence for acute pulmonary embolus. 2. The again seen is a large cavitary left lung mass with extensive left lateral chest wall involvement. Stable to mildly decreased in size in the interval, this mass exhibits progressive internal necrosis. 3. Interval development of numerous cavitary lung nodules within the left upper lobe. 4. Loculated pleural effusion within the left lower hemithorax has  increased in size from previous exam. 5.  Aortic Atherosclerosis (ICD10-I70.0). 6. Coronary artery atherosclerotic calcifications. Electronically Signed   By: Kerby Moors M.D.   On: 08/24/2018 12:35   Mr Jeri Cos DG Contrast  Result Date: 08/08/2018 CLINICAL DATA:  Lung cancer staging EXAM: MRI HEAD WITHOUT AND WITH CONTRAST TECHNIQUE: Multiplanar, multiecho pulse sequences of the brain and surrounding structures were obtained without and with intravenous contrast. CONTRAST:  9 mL Gadovist IV COMPARISON:  CT head 11/04/2015 FINDINGS: Brain: Mild atrophy. Negative for hydrocephalus. Negative for acute infarct. Negative for hemorrhage mass or edema. Normal enhancement postcontrast administration. No enhancing mass lesion identified. Vascular: Normal arterial flow void Skull and upper cervical spine: Negative Sinuses/Orbits: Mild mucosal edema paranasal sinuses.  Normal orbit Other: None IMPRESSION: No acute abnormality and negative for metastatic disease. Electronically Signed   By: Franchot Gallo M.D.   On: 08/08/2018 14:56   [place] Chest (pleural Effusion)  Result Date: 08/24/2018 CLINICAL DATA:  58 year old male with hypoxia. Evaluate for pleural effusion. EXAM: CHEST ULTRASOUND COMPARISON:  Chest x-ray 08/23/2018; prior CT scan of the chest 07/10/2018 FINDINGS: Sonographic evaluation of the left chest demonstrates a consolidated left lower lobe. There also appears to be some pleural fluid, however the fluid is extremely complex and similar in density to the consolidated lung. Thoracentesis was not performed. IMPRESSION: Consolidated left lower lung consistent with history of known chest wall mass. While there may be some pleural fluid, the fluid is difficult to separate from the left chest mass and therefore thoracentesis was deferred. Electronically Signed   By: Jacqulynn Cadet M.D.   On: 08/24/2018 11:08   Nm Pet Image Initial (pi) Skull Base To Thigh  Result Date: 08/05/2018 CLINICAL DATA:  Initial  treatment strategy for non-small cell lung cancer. EXAM: NUCLEAR MEDICINE PET SKULL BASE TO THIGH TECHNIQUE: 9.88 mCi F-18 FDG was injected intravenously. Full-ring PET imaging was performed from the skull base to thigh after the radiotracer. CT data was obtained and used for attenuation correction and anatomic localization. Fasting blood glucose: 137 mg/dl COMPARISON:  Chest CT 07/10/2018 FINDINGS: Mediastinal blood pool activity: SUV max 1.97 NECK: No hypermetabolic lymph nodes in the neck. Incidental CT findings: Advanced bilateral carotid artery calcifications for age. CHEST: The large mass occupying the left lower hemithorax is hypermetabolic and consistent with known malignancy. SUV max is 13.91. As demonstrated on the CT scan the lesion invades the chest wall with numerous destroyed left ribs and extension of the tumor into the chest wall musculature including the intercostal muscles and the serrated anterior.  Single pleural based nodule measuring 9 mm in the left upper lobe is hypermetabolic with SUV max of 7.59. This is likely a pleural metastasis. No right-sided pulmonary nodules to suggest metastatic disease. No enlarged or hypermetabolic mediastinal or hilar lymph nodes. Incidental CT findings: Age advanced aortic and coronary artery calcifications. ABDOMEN/PELVIS: No hypermetabolic lesions are identified in the liver or adrenal glands to suggest metastatic disease. The pancreas, spleen and kidneys are unremarkable. No enlarged or hypermetabolic abdominal/pelvic lymph nodes to suggest metastatic disease. Incidental CT findings: Advanced atherosclerotic calcifications involving the aorta and branch vessels. SKELETON: Other than the directly invaded left ribs I do not C osseous metastatic lesions or other areas of hypermetabolism. Incidental CT findings: none IMPRESSION: 1. Large centrally necrotic left lower lobe lung mass invading the chest wall and destroying the ribs is hypermetabolic and consistent  with known neoplasm. 2. No enlarged or hypermetabolic mediastinal or hilar lymph nodes. 3. Single 9 mm pleural nodule in the left upper lobe is hypermetabolic and likely a pleural metastasis. 4. No findings for metastatic disease involving the abdomen/pelvis or osseous structures (other than the directly invaded ribs and left chest wall muscles). Electronically Signed   By: Marijo Sanes M.D.   On: 08/05/2018 16:23   Dg Chest Port 1 View  Result Date: 08/23/2018 CLINICAL DATA:  Short of breath. EXAM: PORTABLE CHEST 1 VIEW COMPARISON:  1Nov 15, 202019 FINDINGS: The heart size and mediastinal contours are within normal limits. Large left lower lobe lung mass with chest wall involvement is again noted and appears similar to previous exam. There is a left pleural effusion which appears increased in volume from previous study. Right lung appears clear. The visualized skeletal structures are unremarkable. IMPRESSION: 1. Interval increase in volume of left pleural effusion. 2. Similar appearance of large left lower lobe lung mass with chest wall involvement. Electronically Signed   By: Kerby Moors M.D.   On: 08/23/2018 12:19   Echo 08/24/2018: - Left ventricle: The cavity size was normal. Wall thickness was   increased in a pattern of mild LVH. Systolic function was normal.   The estimated ejection fraction was in the range of 60% to 65%.   Wall motion was normal; there were no regional wall motion   abnormalities. There was no evidence of elevated ventricular   filling pressure by Doppler parameters. - Aortic valve: Trileaflet; mildly thickened, mildly calcified   leaflets. - Mitral valve: Mildly thickened leaflets . There was trivial   regurgitation.   Subjective:   Discharge Exam: Vitals:   08/25/18 0735 08/25/18 0739  BP:    Pulse:    Resp:    Temp:    SpO2: 93% 93%   Vitals:   08/24/18 2048 08/25/18 0534 08/25/18 0735 08/25/18 0739  BP:  (!) 156/98    Pulse:  72    Resp:  18    Temp:   98.7 F (37.1 C)    TempSrc:  Oral    SpO2: 96% (!) 88% 93% 93%  Weight:      Height:        General exam: Appears calm and comfortable  Respiratory system: No increased work of breathing, diminished lung sounds at left lower base, no wheezes or crackles Cardiovascular system: Regular rate and rhythm, no murmurs Gastrointestinal system: Soft, nondistended, no rebound or guarding, plus bowel sounds Central nervous system: Alert and oriented.  Grossly intact, moving all extremities Extremities: 1+ lower extremity edema Skin: Port site is clean dry and intact Psychiatry: Judgement  and insight appear normal. Mood & affect appropriate.     The results of significant diagnostics from this hospitalization (including imaging, microbiology, ancillary and laboratory) are listed below for reference.     Microbiology: Recent Results (from the past 240 hour(s))  Culture, sputum-assessment     Status: None   Collection Time: 08/23/18  8:00 PM  Result Value Ref Range Status   Specimen Description SPUTUM  Final   Special Requests NONE  Final   Sputum evaluation   Final    THIS SPECIMEN IS ACCEPTABLE FOR SPUTUM CULTURE Performed at [facility], [place] [address], [place], [place] [address]    Report Status 08/23/2018 FINAL  Final  Culture, respiratory     Status: None (Preliminary result)   Collection Time: 08/23/18  8:00 PM  Result Value Ref Range Status   Specimen Description   Final    SPUTUM Performed at [facility], [place] [address], [place], [place] [address]    Special Requests   Final    NONE Reflexed from [id number] Performed at [place] [address], [place], [place] [address]    Gram Stain   Final    MODERATE WBC PRESENT, PREDOMINANTLY PMN ABUNDANT GRAM POSITIVE COCCI IN PAIRS ABUNDANT GRAM NEGATIVE RODS FEW GRAM POSITIVE RODS    Culture   Final    CULTURE REINCUBATED FOR BETTER GROWTH Performed at [facility], [place] [address] [place], [place] [address]    Report Status PENDING  Incomplete     Labs: BNP (last 3 results) Recent Labs    08/23/18 1133  BNP 751.0*   Basic Metabolic Panel: Recent Labs  Lab 08/23/18 1133 08/24/18 0439 08/25/18 0354  NA 134* 136 133*  K 4.0 5.4* 4.1  CL 96* 97* 96*  CO2 25 26 25   GLUCOSE 238* 334* 191*  BUN 22* 28* 30*  CREATININE 0.91 1.07 0.94  CALCIUM 8.1* 8.3* 7.9*   Liver Function Tests: Recent Labs  Lab 08/23/18 1149  AST 6*  ALT 10  ALKPHOS 215*  BILITOT 1.1  PROT 5.7*  ALBUMIN 2.4*   No results for input(s): LIPASE, AMYLASE in the last 168 hours. No results for input(s): AMMONIA in the last 168 hours. CBC: Recent Labs  Lab 08/23/18 1133 08/24/18 0439 08/25/18 0354  WBC 12.5* 12.3* 15.3*  HGB 12.1* 12.6* 12.2*  HCT 39.1 41.3 38.1*  MCV 86.1 87.9 82.8  PLT 323 278 259   Cardiac Enzymes: No results for input(s): CKTOTAL, CKMB, CKMBINDEX, TROPONINI in the last 168 hours. BNP: Invalid input(s): POCBNP CBG: Recent Labs  Lab 08/24/18 0724 08/24/18 1109 08/24/18 1719 08/24/18 2208 08/25/18 0746  GLUCAP 284* 413* 184* 130* 194*   D-Dimer No results for input(s): DDIMER in the last 72 hours. Hgb A1c Recent Labs    08/24/18 0439  HGBA1C 8.0*   Lipid Profile No results for input(s): CHOL, HDL, LDLCALC, TRIG, CHOLHDL, LDLDIRECT in the last 72 hours. Thyroid function studies No results for input(s): TSH, T4TOTAL, T3FREE, THYROIDAB in the last 72 hours.  Invalid input(s): FREET3 Anemia work up No results for input(s): VITAMINB12, FOLATE, FERRITIN, TIBC, IRON, RETICCTPCT in the last 72 hours. Urinalysis    Component Value Date/Time   COLORURINE AMBER (A) 08/23/2018 1133   APPEARANCEUR HAZY (A) 08/23/2018 1133   LABSPEC 1.025 08/23/2018 1133   PHURINE 5.0 08/23/2018 1133   GLUCOSEU 150 (A) 08/23/2018 1133   HGBUR SMALL (A) 08/23/2018 1133   BILIRUBINUR  NEGATIVE 08/23/2018 1133   KETONESUR 5 (A) 08/23/2018  1133   PROTEINUR >=300 (A) 08/23/2018 1133   NITRITE NEGATIVE 08/23/2018 1133   LEUKOCYTESUR NEGATIVE 08/23/2018 1133   Sepsis Labs Invalid input(s): PROCALCITONIN,  WBC,  LACTICIDVEN Microbiology Recent Results (from the past 240 hour(s))  Culture, sputum-assessment     Status: None   Collection Time: 08/23/18  8:00 PM  Result Value Ref Range Status   Specimen Description SPUTUM  Final   Special Requests NONE  Final   Sputum evaluation   Final    THIS SPECIMEN IS ACCEPTABLE FOR SPUTUM CULTURE Performed at [facility], [place] [address], [place], [place] [address]    Report Status 08/23/2018 FINAL  Final  Culture, respiratory     Status: None (Preliminary result)   Collection Time: 08/23/18  8:00 PM  Result Value Ref Range Status   Specimen Description   Final    SPUTUM Performed at [facility], [place] [address], [place], [place] [address]    Special Requests   Final    NONE Reflexed from [id number] Performed at [place] [address] [place], [place] [address]    Gram Stain   Final    MODERATE WBC PRESENT, PREDOMINANTLY PMN ABUNDANT GRAM POSITIVE COCCI IN PAIRS ABUNDANT GRAM NEGATIVE RODS FEW GRAM POSITIVE RODS    Culture   Final    CULTURE REINCUBATED FOR BETTER GROWTH Performed at [facility], [place] [address], [place],  [address]    Report Status PENDING  Incomplete     Time coordinating discharge: 32  SIGNED:   Cristy Folks, MD  [facility] 08/25/2018, 10:44 AM  If 7PM-7AM, please contact night-coverage [email or web address] Password [email or web address]

## 2018-08-25 NOTE — Progress Notes (Signed)
PHARMACIST - PHYSICIAN COMMUNICATION DR:   Herbert Moors CONCERNING: Antibiotic IV to Oral Route Change Policy  RECOMMENDATION: This patient is receiving azithromycin by the intravenous route.  Based on criteria approved by the Pharmacy and Therapeutics Committee, the antibiotic(s) is/are being converted to the equivalent oral dose form(s).   DESCRIPTION: These criteria include:  Patient being treated for a respiratory tract infection, urinary tract infection, cellulitis or clostridium difficile associated diarrhea if on metronidazole  The patient is not neutropenic and does not exhibit a GI malabsorption state  The patient is eating (either orally or via tube) and/or has been taking other orally administered medications for a least 24 hours  The patient is improving clinically and has a Tmax < 100.5  If you have questions about this conversion, please contact the Pharmacy Department  []   [phone] )  [facility] []   [phone] )  [facility] []   [phone] )  [facility] []   [phone] )  [facility] [x]   [phone] )  [place], PharmD Clinical Pharmacist Pager # [phone]

## 2018-08-25 NOTE — Progress Notes (Signed)
Resumed care for pt. Agree with previous RN's assessment. Will continue with plan of care.

## 2018-08-25 NOTE — Progress Notes (Signed)
SATURATION QUALIFICATIONS: (This note is used to comply with regulatory documentation for home oxygen)  Patient Saturations on Room Air at Rest = 86%  Patient Saturations on Room Air while Ambulating = N/A  Patient Saturations on 3 Liters of oxygen while Ambulating = 91%  Please briefly explain why patient needs home oxygen: Pt will require home oxygen supplementation at 3LPM of O2 via [place] in order to keep oxygen saturations at a safe level for discharge home.

## 2018-08-26 ENCOUNTER — Ambulatory Visit: Payer: Self-pay

## 2018-08-26 LAB — CULTURE, RESPIRATORY W GRAM STAIN: Culture: NORMAL

## 2018-08-27 ENCOUNTER — Ambulatory Visit
Admission: RE | Admit: 2018-08-27 | Discharge: 2018-08-27 | Disposition: A | Discharge status: Home / Self Care | Payer: Self-pay | Source: Ambulatory Visit | Attending: Radiation Oncology | Admitting: Radiation Oncology

## 2018-08-28 ENCOUNTER — Ambulatory Visit
Admission: RE | Admit: 2018-08-28 | Discharge: 2018-08-28 | Disposition: A | Discharge status: Home / Self Care | Payer: Self-pay | Source: Ambulatory Visit | Attending: Radiation Oncology | Admitting: Radiation Oncology

## 2018-08-31 ENCOUNTER — Other Ambulatory Visit: Payer: Self-pay

## 2018-08-31 ENCOUNTER — Inpatient Hospital Stay: Payer: Self-pay

## 2018-08-31 ENCOUNTER — Inpatient Hospital Stay (HOSPITAL_BASED_OUTPATIENT_CLINIC_OR_DEPARTMENT_OTHER): Payer: Self-pay | Admitting: Adult Health

## 2018-08-31 ENCOUNTER — Ambulatory Visit
Admission: RE | Admit: 2018-08-31 | Discharge: 2018-08-31 | Disposition: A | Discharge status: Home / Self Care | Payer: Self-pay | Source: Ambulatory Visit | Attending: Radiation Oncology | Admitting: Radiation Oncology

## 2018-08-31 ENCOUNTER — Encounter: Payer: Self-pay | Admitting: Adult Health

## 2018-08-31 VITALS — BP 140/80 | HR 88 | Temp 97.6°F | Resp 18 | Ht 69.0 in | Wt 177.9 lb

## 2018-08-31 DIAGNOSIS — E876 Hypokalemia: Secondary | ICD-10-CM

## 2018-08-31 DIAGNOSIS — J189 Pneumonia, unspecified organism: Secondary | ICD-10-CM

## 2018-08-31 DIAGNOSIS — C3432 Malignant neoplasm of lower lobe, left bronchus or lung: Secondary | ICD-10-CM

## 2018-08-31 DIAGNOSIS — F419 Anxiety disorder, unspecified: Secondary | ICD-10-CM

## 2018-08-31 DIAGNOSIS — C3492 Malignant neoplasm of unspecified part of left bronchus or lung: Secondary | ICD-10-CM

## 2018-08-31 DIAGNOSIS — R0902 Hypoxemia: Secondary | ICD-10-CM

## 2018-08-31 DIAGNOSIS — R634 Abnormal weight loss: Secondary | ICD-10-CM

## 2018-08-31 DIAGNOSIS — I10 Essential (primary) hypertension: Secondary | ICD-10-CM

## 2018-08-31 DIAGNOSIS — C7989 Secondary malignant neoplasm of other specified sites: Secondary | ICD-10-CM

## 2018-08-31 DIAGNOSIS — Z87891 Personal history of nicotine dependence: Secondary | ICD-10-CM

## 2018-08-31 DIAGNOSIS — C3490 Malignant neoplasm of unspecified part of unspecified bronchus or lung: Secondary | ICD-10-CM

## 2018-08-31 DIAGNOSIS — E119 Type 2 diabetes mellitus without complications: Secondary | ICD-10-CM

## 2018-08-31 LAB — CBC WITH DIFFERENTIAL (CANCER CENTER ONLY)
Abs Immature Granulocytes: 0.31 10*3/uL — ABNORMAL HIGH (ref 0.00–0.07)
BASOS PCT: 0 %
Basophils Absolute: 0.1 10*3/uL (ref 0.0–0.1)
Eosinophils Absolute: 0 10*3/uL (ref 0.0–0.5)
Eosinophils Relative: 0 %
HCT: 35.3 % — ABNORMAL LOW (ref 39.0–52.0)
Hemoglobin: 11.4 g/dL — ABNORMAL LOW (ref 13.0–17.0)
Immature Granulocytes: 1 %
Lymphocytes Relative: 3 %
Lymphs Abs: 0.6 10*3/uL — ABNORMAL LOW (ref 0.7–4.0)
MCH: 26.7 pg (ref 26.0–34.0)
MCHC: 32.3 g/dL (ref 30.0–36.0)
MCV: 82.7 fL (ref 80.0–100.0)
Monocytes Absolute: 0.6 10*3/uL (ref 0.1–1.0)
Monocytes Relative: 3 %
Neutro Abs: 20.7 10*3/uL — ABNORMAL HIGH (ref 1.7–7.7)
Neutrophils Relative %: 93 %
PLATELETS: 99 10*3/uL — AB (ref 150–400)
RBC: 4.27 MIL/uL (ref 4.22–5.81)
RDW: 14.2 % (ref 11.5–15.5)
WBC Count: 22.2 10*3/uL — ABNORMAL HIGH (ref 4.0–10.5)
nRBC: 0 % (ref 0.0–0.2)

## 2018-08-31 LAB — CMP (CANCER CENTER ONLY)
ALBUMIN: 2 g/dL — AB (ref 3.5–5.0)
ALT: 12 U/L (ref 0–44)
AST: <6 U/L — ABNORMAL LOW (ref 15–41)
Alkaline Phosphatase: 156 U/L — ABNORMAL HIGH (ref 38–126)
Anion gap: 8 (ref 5–15)
BILIRUBIN TOTAL: 0.9 mg/dL (ref 0.3–1.2)
BUN: 10 mg/dL (ref 6–20)
CO2: 35 mmol/L — ABNORMAL HIGH (ref 22–32)
Calcium: 7.1 mg/dL — ABNORMAL LOW (ref 8.9–10.3)
Chloride: 91 mmol/L — ABNORMAL LOW (ref 98–111)
Creatinine: 0.8 mg/dL (ref 0.61–1.24)
GFR, Est AFR Am: >60 mL/min (ref >60)
GFR, Estimated: >60 mL/min (ref >60)
GLUCOSE: 263 mg/dL — AB (ref 70–99)
Potassium: 2.8 mmol/L — CL (ref 3.5–5.1)
Sodium: 134 mmol/L — ABNORMAL LOW (ref 135–145)
Total Protein: 5.9 g/dL — ABNORMAL LOW (ref 6.5–8.1)

## 2018-08-31 MED ORDER — DIPHENHYDRAMINE HCL 50 MG/ML IJ SOLN
50.0000 mg | Freq: Once | INTRAMUSCULAR | Status: AC
  Administered 2018-08-31: 50 mg via INTRAVENOUS

## 2018-08-31 MED ORDER — SODIUM CHLORIDE 0.9 % IV SOLN
45.0000 mg/m2 | Freq: Once | INTRAVENOUS | Status: AC
  Administered 2018-08-31: 90 mg via INTRAVENOUS
  Filled 2018-08-31: qty 15

## 2018-08-31 MED ORDER — SODIUM CHLORIDE 0.9 % IV SOLN
20.0000 mg | Freq: Once | INTRAVENOUS | Status: AC
  Administered 2018-08-31: 20 mg via INTRAVENOUS
  Filled 2018-08-31: qty 2

## 2018-08-31 MED ORDER — FAMOTIDINE IN NACL 20-0.9 MG/50ML-% IV SOLN
INTRAVENOUS | Status: AC
  Filled 2018-08-31: qty 50

## 2018-08-31 MED ORDER — SODIUM CHLORIDE 0.9 % IV SOLN
Freq: Once | INTRAVENOUS | Status: AC
  Administered 2018-08-31: 13:00:00 via INTRAVENOUS
  Filled 2018-08-31: qty 250

## 2018-08-31 MED ORDER — POTASSIUM CHLORIDE CRYS ER 20 MEQ PO TBCR: 20.0000 meq | EXTENDED_RELEASE_TABLET | Freq: Two times a day (BID) | ORAL | 0 refills | Status: AC

## 2018-08-31 MED ORDER — POTASSIUM CHLORIDE CRYS ER 20 MEQ PO TBCR
EXTENDED_RELEASE_TABLET | ORAL | Status: AC
  Filled 2018-08-31: qty 2

## 2018-08-31 MED ORDER — ALPRAZOLAM 0.5 MG PO TABS: 0.5000 mg | ORAL_TABLET | Freq: Three times a day (TID) | ORAL | 0 refills | Status: AC | PRN

## 2018-08-31 MED ORDER — PALONOSETRON HCL INJECTION 0.25 MG/5ML
0.2500 mg | Freq: Once | INTRAVENOUS | Status: AC
  Administered 2018-08-31: 0.25 mg via INTRAVENOUS

## 2018-08-31 MED ORDER — ALPRAZOLAM 0.5 MG PO TABS: 0.5000 mg | ORAL_TABLET | Freq: Three times a day (TID) | ORAL | 0 refills | Status: DC | PRN

## 2018-08-31 MED ORDER — POTASSIUM CHLORIDE CRYS ER 20 MEQ PO TBCR
40.0000 meq | EXTENDED_RELEASE_TABLET | Freq: Once | ORAL | Status: AC
  Administered 2018-08-31: 40 meq via ORAL

## 2018-08-31 MED ORDER — METHYLPREDNISOLONE 4 MG PO TABS: 4.0000 mg | ORAL_TABLET | Freq: Every day | ORAL | 0 refills | Status: AC

## 2018-08-31 MED ORDER — FAMOTIDINE IN NACL 20-0.9 MG/50ML-% IV SOLN
20.0000 mg | Freq: Once | INTRAVENOUS | Status: AC
  Administered 2018-08-31: 20 mg via INTRAVENOUS

## 2018-08-31 MED ORDER — PALONOSETRON HCL INJECTION 0.25 MG/5ML
INTRAVENOUS | Status: AC
  Filled 2018-08-31: qty 5

## 2018-08-31 MED ORDER — SODIUM CHLORIDE 0.9 % IV SOLN
232.2000 mg | Freq: Once | INTRAVENOUS | Status: AC
  Administered 2018-08-31: 230 mg via INTRAVENOUS
  Filled 2018-08-31: qty 23

## 2018-08-31 MED ORDER — DIPHENHYDRAMINE HCL 50 MG/ML IJ SOLN
INTRAMUSCULAR | Status: AC
  Filled 2018-08-31: qty 1

## 2018-08-31 NOTE — Patient Instructions (Signed)
[facility] Discharge Instructions for Patients Receiving Chemotherapy  Today you received the following chemotherapy agents:  Taxol, Carboplatin  To help prevent nausea and vomiting after your treatment, we encourage you to take your nausea medication as prescribed.   If you develop nausea and vomiting that is not controlled by your nausea medication, call the clinic.   BELOW ARE SYMPTOMS THAT SHOULD BE REPORTED IMMEDIATELY:  *FEVER GREATER THAN 100.5 F  *CHILLS WITH OR WITHOUT FEVER  NAUSEA AND VOMITING THAT IS NOT CONTROLLED WITH YOUR NAUSEA MEDICATION  *UNUSUAL SHORTNESS OF BREATH  *UNUSUAL BRUISING OR BLEEDING  TENDERNESS IN MOUTH AND THROAT WITH OR WITHOUT PRESENCE OF ULCERS  *URINARY PROBLEMS  *BOWEL PROBLEMS  UNUSUAL RASH Items with * indicate a potential emergency and should be followed up as soon as possible.  Feel free to call the clinic should you have any questions or concerns. The clinic phone number is [phone].  Please show the CHEMO ALERT CARD at check-in to the Emergency Department and triage nurse.   

## 2018-08-31 NOTE — Progress Notes (Signed)
Per Mendel Ryder, NP -  OK to proceed with chemo with platelet count  99.   Pt to receive Kdur 40 meq po today while in infusion room.  NP to send in script for [place] to pt's pharmacy.

## 2018-08-31 NOTE — Progress Notes (Signed)
[facility] Follow up:    [name], MD [address] [place] [address]   DIAGNOSIS: Stage IIIa (T4, N0, M0) non-small cell lung cancer, squamous cell carcinoma with sarcomatoid features   SUMMARY OF ONCOLOGIC HISTORY:  1.  presented with large left lower lobe lung mass with invasion of the left chest wall and musculatures as well as destruction of multiple left ribs diagnosed in December 2019.  2. Started on concurrent chemoradiation with weekly Carboplatin AUC of 2 and Paclitaxel 45mg /m2 on 08/10/2018.  CURRENT THERAPY: chemoradiation with Carboplatin and Paclitaxel  INTERVAL HISTORY: Chloe K Hiemstra 58 y.o. male returns for evlauaiton prior to receiving his concurrent chemoradiation with Carboplatin and Paclitaxel.  He was admitted to the hospital from 08/23/2018 through 08/25/2018 for acute hypoxic respiratory failure, related to pleural effusion (unable to tap pleural effusion based on ultrasound) and pneumonia.  He was treated with IV antibiotics, discharged with doxycyline.  He was unable to wean from oxygen, and was discharged with home oxygen.    Lacharles tells me he is feeling better since he was discharged.  His left chest wall pain is improved.  He is not having any further hemoptysis.  He does note a decreased appetite, attempting to eat BID.  He cannot finish his meals, and is only in fact eating once every other day.  He was started on anti hypertensives at discharge and he has been tolerating these well.  He denies any dizziness.  He endorses fatigue.  He notes his breathing seems to be improving, and he doesn't feel as short of breath.  He denies any fevers or chills.    Inez is taking Xanax for anxiety.  He takes this once or twice per day.  He is requesting a refill on this today.  He is taking Doxycycline BID and has a couple of more days of this.  He takes an anti nausea pill daily, he doesn't note nausea, but says it is just so he  doesn't develop nausea.  He takes Percocet when needed.  He was taking it every 4 hours for pain, but now he needs about one per day.  His pain is improving.    Lem lives at home in a suite which is connected to his brother Larry's house.  He is able to do most of his activities of daily living, with the exception of cleaning.  He notes his activity level is improving.  His brother [name] helps him organize his medications to keep everything straight.    Patient Active Problem List   Diagnosis Date Noted  . Acute CHF (congestive heart failure) ([place]) 08/23/2018  . Pleural effusion 08/23/2018  . Community acquired pneumonia 08/23/2018  . Acute on chronic respiratory failure with hypoxia ([place]) 08/23/2018  . Goals of care, counseling/discussion 08/01/2018  . Encounter for antineoplastic chemotherapy 08/01/2018  . Cancer associated pain 08/01/2018  . Stage III squamous cell carcinoma of left lung ([place]) 07/31/2018  . Bronchogenic lung cancer, left ([facility]) 07/11/2018  . Lung nodule 05/30/2014  . DM (diabetes mellitus) ([place]) 12/29/2011  . HTN (hypertension) 12/29/2011  . Hyperlipidemia 12/29/2011    has No Known Allergies.  MEDICAL HISTORY: Past Medical History:  Diagnosis Date  . Cancer (HCC)    Rib  . Diabetes mellitus without complication ([place])   . Fractured rib   . Hypertension   . Stroke ([place])   . Ulcer     SURGICAL HISTORY: History reviewed. No pertinent surgical  history.  SOCIAL HISTORY: Social History   Socioeconomic History  . Marital status: Single    Spouse name: Not on file  . Number of children: Not on file  . Years of education: Not on file  . Highest education level: Not on file  Occupational History  . Not on file  Social Needs  . Financial resource strain: Not on file  . Food insecurity:    Worry: Not on file    Inability: Not on file  . Transportation needs:    Medical: Not on file    Non-medical: Not on file  Tobacco Use  . Smoking status: Former  Smoker    Packs/day: 2.00    Years: 30.00    Pack years: 60.00    Types: Cigarettes    Last attempt to quit: 12/31/2009    Years since quitting: 8.6  . Smokeless tobacco: Never Used  Substance and Sexual Activity  . Alcohol use: Yes  . Drug use: Yes    Types: Marijuana  . Sexual activity: Never  Lifestyle  . Physical activity:    Days per week: Not on file    Minutes per session: Not on file  . Stress: Not on file  Relationships  . Social connections:    Talks on phone: Not on file    Gets together: Not on file    Attends religious service: Not on file    Active member of club or organization: Not on file    Attends meetings of clubs or organizations: Not on file    Relationship status: Not on file  . Intimate partner violence:    Fear of current or ex partner: Not on file    Emotionally abused: Not on file    Physically abused: Not on file    Forced sexual activity: Not on file  Other Topics Concern  . Not on file  Social History Narrative  . Not on file    FAMILY HISTORY: Family History  Problem Relation Age of Onset  . Prostate cancer Father   . Cancer Brother   . ALS Brother   . COPD Brother     Review of Systems  Constitutional: Positive for fatigue and unexpected weight change. Negative for appetite change, chills and fever.  HENT:   Negative for hearing loss, lump/mass, sore throat and trouble swallowing.   Eyes: Negative for eye problems and icterus.  Respiratory: Positive for shortness of breath. Negative for chest tightness and cough.   Cardiovascular: Negative for chest pain, leg swelling and palpitations.  Gastrointestinal: Negative for abdominal distention, abdominal pain, constipation, diarrhea, nausea and vomiting.  Endocrine: Negative for hot flashes.  Musculoskeletal: Negative for arthralgias.  Skin: Negative for itching and rash.  Neurological: Negative for dizziness, extremity weakness and numbness.  Hematological: Negative for adenopathy.  Does not bruise/bleed easily.  Psychiatric/Behavioral: The patient is not nervous/anxious.       PHYSICAL EXAMINATION  ECOG PERFORMANCE STATUS: 3 - Symptomatic, >50% confined to bed  Vitals:   08/31/18 1119  BP: 140/80  Pulse: 88  Resp: 18  Temp: 97.6 F (36.4 C)  SpO2: 99%    Physical Exam Constitutional:      Appearance: Normal appearance. He is ill-appearing. He is not toxic-appearing.     Comments: In wheelchair  HENT:     Head: Normocephalic and atraumatic.     Mouth/Throat:     Mouth: Mucous membranes are moist.     Pharynx: Oropharynx is clear. No oropharyngeal exudate.  Comments: Poor dentition, missing many teeth Eyes:     General: No scleral icterus.    Pupils: Pupils are equal, round, and reactive to light.  Neck:     Musculoskeletal: Neck supple.  Cardiovascular:     Rate and Rhythm: Normal rate and regular rhythm.     Heart sounds: Normal heart sounds.  Pulmonary:     Effort: Pulmonary effort is normal.     Comments: Diminished in left lower lobe  Abdominal:     General: Abdomen is flat. Bowel sounds are normal. There is no distension.     Palpations: Abdomen is soft.     Tenderness: There is no abdominal tenderness. There is no guarding.     Comments: Difficult to fully examine due to him being in wheelchair   Lymphadenopathy:     Cervical: No cervical adenopathy.  Skin:    General: Skin is warm and dry.     Capillary Refill: Capillary refill takes less than 2 seconds.     Findings: No erythema or rash.  Neurological:     General: No focal deficit present.     Mental Status: He is alert.  Psychiatric:        Mood and Affect: Mood normal.        Behavior: Behavior normal.     LABORATORY DATA:  CBC    Component Value Date/Time   WBC 22.2 (H) 08/31/2018 1057   WBC 15.3 (H) 08/25/2018 0354   RBC 4.27 08/31/2018 1057   HGB 11.4 (L) 08/31/2018 1057   HCT 35.3 (L) 08/31/2018 1057   PLT 99 (L) 08/31/2018 1057   MCV 82.7 08/31/2018 1057    MCV 88.7 05/30/2014 1123   MCH 26.7 08/31/2018 1057   MCHC 32.3 08/31/2018 1057   RDW 14.2 08/31/2018 1057   LYMPHSABS 0.6 (L) 08/31/2018 1057   MONOABS 0.6 08/31/2018 1057   EOSABS 0.0 08/31/2018 1057   BASOSABS 0.1 08/31/2018 1057    CMP     Component Value Date/Time   NA 134 (L) 08/31/2018 1057   K 2.8 (LL) 08/31/2018 1057   CL 91 (L) 08/31/2018 1057   CO2 35 (H) 08/31/2018 1057   GLUCOSE 263 (H) 08/31/2018 1057   BUN 10 08/31/2018 1057   CREATININE 0.80 08/31/2018 1057   CREATININE 1.15 05/30/2014 1118   CALCIUM 7.1 (L) 08/31/2018 1057   PROT 5.9 (L) 08/31/2018 1057   ALBUMIN 2.0 (L) 08/31/2018 1057   AST <6 (L) 08/31/2018 1057   ALT 12 08/31/2018 1057   ALKPHOS 156 (H) 08/31/2018 1057   BILITOT 0.9 08/31/2018 1057   GFRNONAA >60 08/31/2018 1057   GFRNONAA 71 05/30/2014 1118   GFRAA >60 08/31/2018 1057   GFRAA 82 05/30/2014 1118         ASSESSMENT and THERAPY PLAN:   Stage III squamous cell carcinoma of left lung ([place]) Jibran is a 58 year old male with stage III squamous cell carcinoma of the left lung currently being treated with chemoradiation with Paclitaxel/Carboplatin weekly.  1. Lung cancer: reviewed his labs and status with Dr. Julien Nordmann today.  He is doing well and will proceed with treatment today with plt count 99.  He will continue with his treatments weekly and return to clinic for evaluation in 2 weeks.    2. Recent pneumonia/hypoxia: clinically improving, conitnuing doxycyline for another two to three days.  No signs of active infection.  3. Weight loss: likely combination of diuresis and decreased appetite.  Per Dr. Julien Nordmann he  will take Medrol dosepak taper to help boost appetite.  I also referred him to our nutritionist.  4. Anxiety: I refilled his Xanax and he is only needing this about daily.  5. Pain Control: Percocet PRN.  He is only needing this once a day which is much improved.    6. Hypokalemia: likely secondary to HCTZ.  Will add  potassium at lower dose than his previous potassium supplement and monitor his potassium weekly.    Dorean will return daily for his radiation, and weekly for his chemo.  His next appointment with medical oncology is in two weeks.   The above was reviewed with Dr. Julien Nordmann in detail.    Orders Placed This Encounter  Procedures  . Ambulatory referral to [facility]    Referral Priority:   Urgent    Referral Type:   Consultation    Referral Reason:   Specialty Services Required    Number of Visits Requested:   1    All questions were answered. The patient knows to call the clinic with any problems, questions or concerns. We can certainly see the patient much sooner if necessary.  A total of (30) minutes of face-to-face time was spent with this patient with greater than 50% of that time in counseling and care-coordination.  This note was electronically signed. Scot Dock, NP 08/31/2018

## 2018-08-31 NOTE — Assessment & Plan Note (Addendum)
Kalai is a 58 year old male with stage III squamous cell carcinoma of the left lung currently being treated with chemoradiation with Paclitaxel/Carboplatin weekly.  1. Lung cancer: reviewed his labs and status with Dr. Julien Nordmann today.  He is doing well and will proceed with treatment today with plt count 99.  He will continue with his treatments weekly and return to clinic for evaluation in 2 weeks.    2. Recent pneumonia/hypoxia: clinically improving, conitnuing doxycyline for another two to three days.  No signs of active infection.  3. Weight loss: likely combination of diuresis and decreased appetite.  Per Dr. Julien Nordmann he will take Medrol dosepak taper to help boost appetite.  I also referred him to our nutritionist.  4. Anxiety: I refilled his Xanax and he is only needing this about daily.  5. Pain Control: Percocet PRN.  He is only needing this once a day which is much improved.    6. Hypokalemia: likely secondary to HCTZ.  Will add potassium at lower dose than his previous potassium supplement and monitor his potassium weekly.    Kyo will return daily for his radiation, and weekly for his chemo.  His next appointment with medical oncology is in two weeks.   The above was reviewed with Dr. Julien Nordmann in detail.

## 2018-09-01 ENCOUNTER — Telehealth: Payer: Self-pay | Admitting: Adult Health

## 2018-09-01 ENCOUNTER — Ambulatory Visit
Admission: RE | Admit: 2018-09-01 | Discharge: 2018-09-01 | Disposition: A | Discharge status: Home / Self Care | Payer: Self-pay | Source: Ambulatory Visit | Attending: Radiation Oncology | Admitting: Radiation Oncology

## 2018-09-01 NOTE — Telephone Encounter (Signed)
Per 11/3 no los °

## 2018-09-02 ENCOUNTER — Ambulatory Visit
Admission: RE | Admit: 2018-09-02 | Discharge: 2018-09-02 | Disposition: A | Discharge status: Home / Self Care | Payer: Self-pay | Source: Ambulatory Visit | Attending: Radiation Oncology | Admitting: Radiation Oncology

## 2018-09-02 ENCOUNTER — Other Ambulatory Visit: Payer: Self-pay | Admitting: Radiation Oncology

## 2018-09-02 MED ORDER — FLUCONAZOLE 100 MG PO TABS: 100.0000 mg | ORAL_TABLET | Freq: Every day | ORAL | 0 refills | Status: AC

## 2018-09-03 ENCOUNTER — Encounter (HOSPITAL_COMMUNITY): Payer: Self-pay

## 2018-09-03 ENCOUNTER — Inpatient Hospital Stay (HOSPITAL_COMMUNITY)
Admission: EM | Admit: 2018-09-03 | Discharge: 2018-09-06 | DRG: 309 | Discharge status: Against Medical Advice | Payer: Self-pay | Source: Ambulatory Visit | Attending: Internal Medicine | Admitting: Internal Medicine

## 2018-09-03 ENCOUNTER — Ambulatory Visit: Payer: Self-pay

## 2018-09-03 ENCOUNTER — Other Ambulatory Visit: Payer: Self-pay

## 2018-09-03 ENCOUNTER — Emergency Department (HOSPITAL_COMMUNITY): Payer: Self-pay

## 2018-09-03 DIAGNOSIS — I10 Essential (primary) hypertension: Secondary | ICD-10-CM | POA: Diagnosis present

## 2018-09-03 DIAGNOSIS — E860 Dehydration: Secondary | ICD-10-CM | POA: Diagnosis present

## 2018-09-03 DIAGNOSIS — T451X5A Adverse effect of antineoplastic and immunosuppressive drugs, initial encounter: Secondary | ICD-10-CM | POA: Diagnosis present

## 2018-09-03 DIAGNOSIS — N179 Acute kidney failure, unspecified: Secondary | ICD-10-CM | POA: Diagnosis present

## 2018-09-03 DIAGNOSIS — E119 Type 2 diabetes mellitus without complications: Secondary | ICD-10-CM

## 2018-09-03 DIAGNOSIS — E871 Hypo-osmolality and hyponatremia: Secondary | ICD-10-CM | POA: Diagnosis present

## 2018-09-03 DIAGNOSIS — I959 Hypotension, unspecified: Secondary | ICD-10-CM | POA: Diagnosis present

## 2018-09-03 DIAGNOSIS — B370 Candidal stomatitis: Secondary | ICD-10-CM | POA: Diagnosis present

## 2018-09-03 DIAGNOSIS — Z9981 Dependence on supplemental oxygen: Secondary | ICD-10-CM

## 2018-09-03 DIAGNOSIS — D72829 Elevated white blood cell count, unspecified: Secondary | ICD-10-CM | POA: Diagnosis present

## 2018-09-03 DIAGNOSIS — E1136 Type 2 diabetes mellitus with diabetic cataract: Secondary | ICD-10-CM | POA: Diagnosis present

## 2018-09-03 DIAGNOSIS — Z9221 Personal history of antineoplastic chemotherapy: Secondary | ICD-10-CM

## 2018-09-03 DIAGNOSIS — J9611 Chronic respiratory failure with hypoxia: Secondary | ICD-10-CM | POA: Diagnosis present

## 2018-09-03 DIAGNOSIS — C3492 Malignant neoplasm of unspecified part of left bronchus or lung: Secondary | ICD-10-CM | POA: Diagnosis present

## 2018-09-03 DIAGNOSIS — I4891 Unspecified atrial fibrillation: Principal | ICD-10-CM | POA: Diagnosis present

## 2018-09-03 DIAGNOSIS — E785 Hyperlipidemia, unspecified: Secondary | ICD-10-CM | POA: Diagnosis present

## 2018-09-03 DIAGNOSIS — Z8673 Personal history of transient ischemic attack (TIA), and cerebral infarction without residual deficits: Secondary | ICD-10-CM

## 2018-09-03 DIAGNOSIS — R69 Illness, unspecified: Secondary | ICD-10-CM

## 2018-09-03 DIAGNOSIS — Z923 Personal history of irradiation: Secondary | ICD-10-CM

## 2018-09-03 DIAGNOSIS — F17210 Nicotine dependence, cigarettes, uncomplicated: Secondary | ICD-10-CM | POA: Diagnosis present

## 2018-09-03 LAB — I-STAT TROPONIN, ED: Troponin i, poc: 0.02 ng/mL (ref 0.00–0.08)

## 2018-09-03 LAB — PHOSPHORUS: Phosphorus: 3.9 mg/dL (ref 2.5–4.6)

## 2018-09-03 LAB — CBC
HCT: 38.4 % — ABNORMAL LOW (ref 39.0–52.0)
Hemoglobin: 12.2 g/dL — ABNORMAL LOW (ref 13.0–17.0)
MCH: 26.8 pg (ref 26.0–34.0)
MCHC: 31.8 g/dL (ref 30.0–36.0)
MCV: 84.2 fL (ref 80.0–100.0)
Platelets: UNDETERMINED 10*3/uL (ref 150–400)
RBC: 4.56 MIL/uL (ref 4.22–5.81)
RDW: 14.4 % (ref 11.5–15.5)
WBC: 29.5 10*3/uL — ABNORMAL HIGH (ref 4.0–10.5)
nRBC: 0 % (ref 0.0–0.2)

## 2018-09-03 LAB — BASIC METABOLIC PANEL
Anion gap: 12 (ref 5–15)
BUN: 32 mg/dL — ABNORMAL HIGH (ref 6–20)
CALCIUM: 6.9 mg/dL — AB (ref 8.9–10.3)
CO2: 31 mmol/L (ref 22–32)
Chloride: 90 mmol/L — ABNORMAL LOW (ref 98–111)
Creatinine, Ser: 1.34 mg/dL — ABNORMAL HIGH (ref 0.61–1.24)
GFR calc Af Amer: >60 mL/min (ref >60)
GFR calc non Af Amer: 58 mL/min — ABNORMAL LOW (ref >60)
Glucose, Bld: 318 mg/dL — ABNORMAL HIGH (ref 70–99)
Potassium: 3.5 mmol/L (ref 3.5–5.1)
SODIUM: 133 mmol/L — AB (ref 135–145)

## 2018-09-03 LAB — TSH: TSH: 2.158 u[IU]/mL (ref 0.350–4.500)

## 2018-09-03 LAB — MRSA PCR SCREENING: MRSA by PCR: NEGATIVE

## 2018-09-03 LAB — CBG MONITORING, ED: Glucose-Capillary: 289 mg/dL — ABNORMAL HIGH (ref 70–99)

## 2018-09-03 LAB — GLUCOSE, CAPILLARY: Glucose-Capillary: 337 mg/dL — ABNORMAL HIGH (ref 70–99)

## 2018-09-03 LAB — TROPONIN I: Troponin I: <0.03 ng/mL (ref <0.03)

## 2018-09-03 LAB — MAGNESIUM: Magnesium: 1.1 mg/dL — ABNORMAL LOW (ref 1.7–2.4)

## 2018-09-03 MED ORDER — ORAL CARE MOUTH RINSE
15.0000 mL | Freq: Two times a day (BID) | OROMUCOSAL | Status: DC
  Administered 2018-09-03 – 2018-09-05 (×4): 15 mL via OROMUCOSAL

## 2018-09-03 MED ORDER — INSULIN ASPART 100 UNIT/ML ~~LOC~~ SOLN
0.0000 [IU] | Freq: Three times a day (TID) | SUBCUTANEOUS | Status: DC
  Administered 2018-09-04 (×2): 3 [IU] via SUBCUTANEOUS
  Administered 2018-09-04 – 2018-09-05 (×4): 2 [IU] via SUBCUTANEOUS
  Administered 2018-09-06: 3 [IU] via SUBCUTANEOUS
  Administered 2018-09-06: 1 [IU] via SUBCUTANEOUS
  Administered 2018-09-06: 3 [IU] via SUBCUTANEOUS

## 2018-09-03 MED ORDER — ACETAMINOPHEN 325 MG PO TABS: 650.0000 mg | ORAL_TABLET | ORAL | Status: DC | PRN

## 2018-09-03 MED ORDER — SODIUM CHLORIDE 0.9 % IV BOLUS (SEPSIS)
500.0000 mL | Freq: Once | INTRAVENOUS | Status: AC
  Administered 2018-09-03: 500 mL via INTRAVENOUS

## 2018-09-03 MED ORDER — OXYCODONE-ACETAMINOPHEN 7.5-325 MG PO TABS
1.0000 | ORAL_TABLET | ORAL | Status: DC | PRN
  Administered 2018-09-03 – 2018-09-05 (×7): 1 via ORAL
  Filled 2018-09-03 (×7): qty 1

## 2018-09-03 MED ORDER — SODIUM CHLORIDE 0.9% FLUSH: 3.0000 mL | Freq: Once | INTRAVENOUS | Status: DC

## 2018-09-03 MED ORDER — ONDANSETRON HCL 4 MG/2ML IJ SOLN: 4.0000 mg | Freq: Four times a day (QID) | INTRAMUSCULAR | Status: DC | PRN

## 2018-09-03 MED ORDER — RIVAROXABAN 20 MG PO TABS
20.0000 mg | ORAL_TABLET | Freq: Every day | ORAL | Status: DC
  Administered 2018-09-03 – 2018-09-06 (×4): 20 mg via ORAL
  Filled 2018-09-03 (×4): qty 1

## 2018-09-03 MED ORDER — FLUCONAZOLE 100 MG PO TABS
100.0000 mg | ORAL_TABLET | Freq: Every day | ORAL | Status: DC
  Administered 2018-09-03 – 2018-09-06 (×4): 100 mg via ORAL
  Filled 2018-09-03 (×5): qty 1

## 2018-09-03 MED ORDER — SODIUM CHLORIDE 0.9 % IV SOLN
1000.0000 mL | INTRAVENOUS | Status: DC
  Administered 2018-09-03: 1000 mL via INTRAVENOUS

## 2018-09-03 MED ORDER — POTASSIUM CHLORIDE CRYS ER 20 MEQ PO TBCR
40.0000 meq | EXTENDED_RELEASE_TABLET | Freq: Every day | ORAL | Status: DC
  Administered 2018-09-03 – 2018-09-06 (×4): 40 meq via ORAL
  Filled 2018-09-03 (×4): qty 2

## 2018-09-03 MED ORDER — DILTIAZEM HCL-DEXTROSE 100-5 MG/100ML-% IV SOLN (PREMIX)
5.0000 mg/h | INTRAVENOUS | Status: DC
  Administered 2018-09-03 – 2018-09-06 (×4): 5 mg/h via INTRAVENOUS
  Filled 2018-09-03 (×7): qty 100

## 2018-09-03 MED ORDER — MAGNESIUM CHLORIDE 64 MG PO TBEC
1.0000 | DELAYED_RELEASE_TABLET | Freq: Every day | ORAL | Status: DC
  Administered 2018-09-03: 64 mg via ORAL
  Filled 2018-09-03 (×2): qty 1

## 2018-09-03 MED ORDER — DILTIAZEM HCL 25 MG/5ML IV SOLN
15.0000 mg | Freq: Once | INTRAVENOUS | Status: AC
  Administered 2018-09-03: 15 mg via INTRAVENOUS
  Filled 2018-09-03: qty 5

## 2018-09-03 MED ORDER — ENSURE ENLIVE PO LIQD
237.0000 mL | Freq: Two times a day (BID) | ORAL | Status: DC
  Administered 2018-09-04: 237 mL via ORAL

## 2018-09-03 MED ORDER — METHYLPREDNISOLONE 2 MG PO TABS
4.0000 mg | ORAL_TABLET | Freq: Every day | ORAL | Status: DC
  Administered 2018-09-03 – 2018-09-06 (×3): 4 mg via ORAL
  Filled 2018-09-03 (×4): qty 2

## 2018-09-03 MED ORDER — ALPRAZOLAM 0.5 MG PO TABS
0.5000 mg | ORAL_TABLET | Freq: Three times a day (TID) | ORAL | Status: DC | PRN
  Administered 2018-09-04 – 2018-09-06 (×5): 0.5 mg via ORAL
  Filled 2018-09-03 (×6): qty 1

## 2018-09-03 MED ORDER — PROCHLORPERAZINE MALEATE 10 MG PO TABS: 10.0000 mg | ORAL_TABLET | Freq: Four times a day (QID) | ORAL | Status: DC | PRN

## 2018-09-03 MED ORDER — INSULIN ASPART 100 UNIT/ML ~~LOC~~ SOLN
0.0000 [IU] | Freq: Every day | SUBCUTANEOUS | Status: DC
  Administered 2018-09-03: 4 [IU] via SUBCUTANEOUS
  Administered 2018-09-04: 3 [IU] via SUBCUTANEOUS

## 2018-09-03 NOTE — ED Notes (Signed)
Patient states that he prefers to sit in a chair -he is given a recliner for comfort

## 2018-09-03 NOTE — ED Notes (Signed)
Patient denies pain and is resting comfortably.  

## 2018-09-03 NOTE — ED Notes (Signed)
[facility] called for transport to [facility]

## 2018-09-03 NOTE — H&P (Signed)
History and Physical    [name] IDP:[id number] DOB: 11-24-1960 DOA: 09/03/2018  PCP: [name], MD  Patient coming from: Ophtho office   Chief Complaint: High heart rate  HPI: Dylan Jacobs is a 58 y.o. male with medical history significant of H/O TIA, HTN, DM2 (not started on therapy yet), Lung cancer undergoing current therapy, cataracts who presents for new onset Afib.  He reports that he was going to his ophthalmologists office today for cataract surgery.  He felt his normal self except some very mild lightheadedness.  Otherwise, reports no symptoms.  He has been having some chronic decreased appetite, low fluid intake, weight loss, thrush due to his chemotherapy and radiation.  He further has some LE swelling which has been off an on for the last 3-4 months, which he reports is due to not working on sitting around.  Further reports some looser stools, but not diarrhea and occasional chills.  He uses 3L of oxygen at home chronically.  He denies palpitations, chest pain, fever, change in urinary habits, dysuria, change in chronic cough, hemoptysis, pain in the abdomen, nausea, vomiting.  He has no apparent signs of infection, however, his WBC is elevated and his BP is suppressed (possibly related to HR).   ED Course: In the ED, he was noted to have a WBC of 29.  He was found to be in Afib with rates int he 160s.  He was given some rate controlling medication along with some fluids.  His Cr is up to 1.34 from 0.8 3 days ago.  His BS is 318.  He underwent chemotherapy on 08/31/18 and has been getting daily XRT.   Review of Systems: As per HPI otherwise 10 point review of systems negative.    Past Medical History:  Diagnosis Date  . Cancer (HCC)    Rib  . Diabetes mellitus without complication ([facility])   . Fractured rib   . Hypertension   . Stroke ([place])   . Ulcer     Past Surgical History:  Procedure Laterality Date  . NO PAST SURGERIES       reports that he has been  smoking cigarettes. He has a 60.00 pack-year smoking history. He has never used smokeless tobacco. He reports current alcohol use. He reports current drug use. Drug: Marijuana.  No Known Allergies  Family History  Problem Relation Age of Onset  . Prostate cancer Father   . Cancer Brother   . ALS Brother   . COPD Brother     Prior to Admission medications   Medication Sig Start Date End Date Taking? Authorizing Provider  ALPRAZolam Duanne Moron) 0.5 MG tablet Take 1 tablet (0.5 mg total) by mouth 3 (three) times daily as needed for anxiety. 08/31/18  Yes Causey, Charlestine Massed, NP  amLODipine (NORVASC) 5 MG tablet Take 1 tablet (5 mg total) by mouth daily. 08/26/18  Yes Purohit, Konrad Dolores, MD  hydrochlorothiazide (HYDRODIURIL) 25 MG tablet Take 1 tablet (25 mg total) by mouth daily. 08/26/18  Yes Purohit, Konrad Dolores, MD  oxyCODONE-acetaminophen (PERCOCET) 7.5-325 MG tablet Take 1 tablet by mouth every 4 (four) hours as needed for severe pain. 08/22/18  Yes Gery Pray, MD  potassium chloride SA (K-DUR,KLOR-CON) 20 MEQ tablet Take 1 tablet (20 mEq total) by mouth 2 (two) times daily. Patient taking differently: Take 40 mEq by mouth daily.  08/31/18  Yes Causey, Charlestine Massed, NP  prochlorperazine (COMPAZINE) 10 MG tablet Take 1 tablet (10 mg total) by mouth every  6 (six) hours as needed for nausea or vomiting. 07/31/18  Yes Curt Bears, MD  sitaGLIPtin-metformin (JANUMET) 50-1000 MG tablet Take 1 tablet by mouth 2 (two) times daily with a meal. 08/25/18 09/24/18 Yes Purohit, Konrad Dolores, MD  fluconazole (DIFLUCAN) 100 MG tablet Take 1 tablet (100 mg total) by mouth daily. 09/02/18   Gery Pray, MD  glipiZIDE (GLUCOTROL) 5 MG tablet Take 5 mg by mouth daily before breakfast.    [provider]  methylPREDNISolone (MEDROL) 4 MG tablet Take 1 tablet (4 mg total) by mouth daily. Taper 6,5,4,3,2,1 08/31/18   Gardenia Phlegm, NP    Physical Exam: Constitutional: Thin gentleman, lying in bed,  no distress Vitals:   09/03/18 1720 09/03/18 1726 09/03/18 1727 09/03/18 1728  BP: 104/84     Pulse: (!) 117 86  (!) 115  Resp: 17 17 16 18   SpO2: 100% 100%  100%  Weight:      Height:       Eyes: Anicteric sclerae, no conjunctival injection ENMT: Mucous membranes are dry and with white plaque on tongue, lips.  He has a large beard.   Neck: normal, supple Respiratory: No breath sounds to mid lung on the left, decreased at mid lung with some crackles.  He has good breath sounds on the left with some mild crackles at bases.  No rales.  Cardiovascular: Tachycardic, Irreg Irreg, no murmur noted. Pulses are intact in DP and radial bilaterally, he has 2+ pitting edema to knees bilaterally.   Abdomen: +BS, ND Musculoskeletal: + clubbing of digits of hands, no cyanosis, somewhat cool extremities.  Skin: no rashes, lesions on exposed skin.  He has a healing superficial wound on the right shin.  He has some mild discoloration on the left upper back.   Neurologic: CN 2-12 grossly intact. Strength grossly intact.  Psychiatric: Normal judgment and insight. Alert and oriented x 3. Normal mood.   Labs on Admission: I have personally reviewed following labs and imaging studies  CBC: Recent Labs  Lab 08/31/18 1057 09/03/18 1027  WBC 22.2* 29.5*  NEUTROABS 20.7*  --   HGB 11.4* 12.2*  HCT 35.3* 38.4*  MCV 82.7 84.2  PLT 99* PLATELET CLUMPS NOTED ON SMEAR, UNABLE TO ESTIMATE   Basic Metabolic Panel: Recent Labs  Lab 08/31/18 1057 09/03/18 1027 09/03/18 1123  NA 134* 133*  --   K 2.8* 3.5  --   CL 91* 90*  --   CO2 35* 31  --   GLUCOSE 263* 318*  --   BUN 10 32*  --   CREATININE 0.80 1.34*  --   CALCIUM 7.1* 6.9*  --   MG  --   --  1.1*  PHOS  --   --  3.9   GFR: Estimated Creatinine Clearance: 59.4 mL/min (A) (by C-G formula based on SCr of 1.34 mg/dL (H)). Liver Function Tests: Recent Labs  Lab 08/31/18 1057  AST <6*  ALT 12  ALKPHOS 156*  BILITOT 0.9  PROT 5.9*  ALBUMIN  2.0*   No results for input(s): LIPASE, AMYLASE in the last 168 hours. No results for input(s): AMMONIA in the last 168 hours. Coagulation Profile: No results for input(s): INR, PROTIME in the last 168 hours. Cardiac Enzymes: No results for input(s): CKTOTAL, CKMB, CKMBINDEX, TROPONINI in the last 168 hours. BNP (last 3 results) No results for input(s): PROBNP in the last 8760 hours. HbA1C: No results for input(s): HGBA1C in the last 72 hours. CBG: Recent Labs  Lab 09/03/18 1338  GLUCAP 289*   Lipid Profile: No results for input(s): CHOL, HDL, LDLCALC, TRIG, CHOLHDL, LDLDIRECT in the last 72 hours. Thyroid Function Tests: Recent Labs    09/03/18 1123  TSH 2.158   Anemia Panel: No results for input(s): VITAMINB12, FOLATE, FERRITIN, TIBC, IRON, RETICCTPCT in the last 72 hours. Urine analysis:    Component Value Date/Time   COLORURINE AMBER (A) 08/23/2018 1133   APPEARANCEUR HAZY (A) 08/23/2018 1133   LABSPEC 1.025 08/23/2018 1133   PHURINE 5.0 08/23/2018 1133   GLUCOSEU 150 (A) 08/23/2018 1133   HGBUR SMALL (A) 08/23/2018 1133   BILIRUBINUR NEGATIVE 08/23/2018 1133   KETONESUR 5 (A) 08/23/2018 1133   PROTEINUR >=300 (A) 08/23/2018 1133   NITRITE NEGATIVE 08/23/2018 1133   LEUKOCYTESUR NEGATIVE 08/23/2018 1133    Radiological Exams on Admission: Dg Chest 2 View  Result Date: 09/03/2018 CLINICAL DATA:  Irregular heartbeat and shortness of breath EXAM: CHEST - 2 VIEW COMPARISON:  08/24/2018 FINDINGS: Large pleural based density with central cavitation is again identified laterally similar to that seen on prior CT examination. No pneumothorax is seen. The right lung remains clear. No bony abnormality is noted. IMPRESSION: Stable mass in the left lung similar to that seen on the prior CT examination. No new acute abnormality is noted. Electronically Signed   By: Inez Catalina M.D.   On: 09/03/2018 11:16    EKG: Independently reviewed. Afib with a rapid  rate  Assessment/Plan New onset a-fib  - Asymptomatic at presentation.  Likely cause includes dehydration due to decreased PO Intake during chemotherapy, possibly acute reaction to chemotherapy (paclitaxel can cause cardiac arrhythmia in 1%, tachycardia in 3%) - He was started on diltiazem drip and HR improved into the 130s.  - Transition to PO medications when able, consider Cardiology consult - Xarelto started, consider discussing with oncology pharmacy to see if any drug interactions with his chemotherapy, lovenox may be a better choice while he is receiving treatment.  - IVF with NS to correct dehydration - Trend troponin  AKI, hyponatremia, hypomagnesemia - Renal function is acutely worse, BUN/Cr ratio is > 20, likely pre-renal - IVF with NS, bolus in the ED and then continued at 125cc/hr - EF is preserved based on TTE from 08/24/18 - Continue fluids overnight - Repeat Cr in the AM - replete magnesium - Continue home KCl replacement  Leukocytosis - Patient received dexamethasone 100mg  on 1/13 - he is supposed to be on a medrol dose pack as well, which I continued.  I think this may explain his elevated WBC.  He does not have signs or symptoms of infection except the tachycardia.  Will monitor closely for fever given recent chemotherapy.  Urine and CXR did not show signs of acute infection  Stage III squamous cell carcinoma of left lung - He prefers to go to [facility] hospital so he can continue treatment - Admit to [facility] per patient request.  - Continue home pain management regimen - PT/OT  Chronic respiratory failure - continue home Oxygen 3L, monitor pulse ox    DM (diabetes mellitus)  - He has not started medications yet, will start on SSI    HTN (hypertension) - Blood pressure is running low - Hold home amlodipine, hctz  Oral thrush - Start diflucan      DVT prophylaxis: Xarelto  Code Status: Full  Disposition Plan: Admit to [facility] for treatment  Consults called: None   Admission status: SDU, inpatient   Gilles Chiquito MD  [facility]  If 7PM-7AM, please contact night-coverage [email or web address]  09/03/2018, 5:42 PM

## 2018-09-03 NOTE — ED Triage Notes (Signed)
Pt from [facility]; lung cancer pt, radiation yesterday; supposed to have bilateral cataract sx today, noticed pt to be in afib rvr; asymptomatic; pt has not had meds in 8 years; pt reports poor appetite lately, drinks 1/2 gallon water/day; no documented hx afib  CBG 372 RR 20;   100% 3L 02 wears all the time; intolerant of supine HR 148-168 BP 123/68

## 2018-09-03 NOTE — ED Provider Notes (Signed)
[facility] EMERGENCY DEPARTMENT Provider Note   CSN: [id number] Arrival date & time: 09/03/18  1018     History   Chief Complaint Chief Complaint  Patient presents with  . Atrial Fibrillation    HPI Dylan Jacobs is a 58 y.o. male.  HPI Patient is sent from outpatient clinic for high heart rate.  He reports he went for a cataract repair that was scheduled.  He reports he felt fine.  They checked his vital signs and noted that he did very high heart rate and was referred to the emergency department.  Patient denies he is been aware of chest pain or palpitations.  He does have lung cancer and is currently under going chemo radiation.  He denies he has had any increased difficulty breathing.  He reports he is at his baseline.  He has not had fevers or cough.  He was recently treated for pneumonia and discharged from the hospital approximately 10 days ago.  He reports since that time he is felt well.  He is chronically had swelling of his legs ever since retiring about 6 months ago.  He denies with any increased amount of swelling.  He denies pain in the legs.  Patient is a chronic smoker.  He reports he has cut back substantially and is now down to a few cigarettes per day. Past Medical History:  Diagnosis Date  . Cancer (HCC)    Rib  . Diabetes mellitus without complication ([place])   . Fractured rib   . Hypertension   . Stroke ([place])   . Ulcer     Patient Active Problem List   Diagnosis Date Noted  . Acute CHF (congestive heart failure) ([place]) 08/23/2018  . Pleural effusion 08/23/2018  . Community acquired pneumonia 08/23/2018  . Acute on chronic respiratory failure with hypoxia ([place]) 08/23/2018  . Goals of care, counseling/discussion 08/01/2018  . Encounter for antineoplastic chemotherapy 08/01/2018  . Cancer associated pain 08/01/2018  . Stage III squamous cell carcinoma of left lung ([place]) 07/31/2018  . Bronchogenic lung cancer, left ([place]) 07/11/2018  . Lung  nodule 05/30/2014  . DM (diabetes mellitus) ([place]) 12/29/2011  . HTN (hypertension) 12/29/2011  . Hyperlipidemia 12/29/2011    No past surgical history on file.      Home Medications    Prior to Admission medications   Medication Sig Start Date End Date Taking? Authorizing Provider  ALPRAZolam Duanne Moron) 0.5 MG tablet Take 1 tablet (0.5 mg total) by mouth 3 (three) times daily as needed for anxiety. 08/31/18  Yes Causey, Charlestine Massed, NP  amLODipine (NORVASC) 5 MG tablet Take 1 tablet (5 mg total) by mouth daily. 08/26/18  Yes Purohit, Konrad Dolores, MD  hydrochlorothiazide (HYDRODIURIL) 25 MG tablet Take 1 tablet (25 mg total) by mouth daily. 08/26/18  Yes Purohit, Konrad Dolores, MD  oxyCODONE-acetaminophen (PERCOCET) 7.5-325 MG tablet Take 1 tablet by mouth every 4 (four) hours as needed for severe pain. 08/22/18  Yes Gery Pray, MD  potassium chloride SA (K-DUR,KLOR-CON) 20 MEQ tablet Take 1 tablet (20 mEq total) by mouth 2 (two) times daily. Patient taking differently: Take 40 mEq by mouth daily.  08/31/18  Yes Causey, Charlestine Massed, NP  prochlorperazine (COMPAZINE) 10 MG tablet Take 1 tablet (10 mg total) by mouth every 6 (six) hours as needed for nausea or vomiting. 07/31/18  Yes Curt Bears, MD  sitaGLIPtin-metformin (JANUMET) 50-1000 MG tablet Take 1 tablet by mouth 2 (two) times daily with a meal. 08/25/18 09/24/18  Yes Purohit, Konrad Dolores, MD  fluconazole (DIFLUCAN) 100 MG tablet Take 1 tablet (100 mg total) by mouth daily. 09/02/18   Gery Pray, MD  glipiZIDE (GLUCOTROL) 5 MG tablet Take 5 mg by mouth daily before breakfast.    [provider]  methylPREDNISolone (MEDROL) 4 MG tablet Take 1 tablet (4 mg total) by mouth daily. Taper 6,5,4,3,2,1 08/31/18   Gardenia Phlegm, NP    Family History Family History  Problem Relation Age of Onset  . Prostate cancer Father   . Cancer Brother   . ALS Brother   . COPD Brother     Social History Social History   Tobacco Use  .  Smoking status: Former Smoker    Packs/day: 2.00    Years: 30.00    Pack years: 60.00    Types: Cigarettes    Last attempt to quit: 12/31/2009    Years since quitting: 8.6  . Smokeless tobacco: Never Used  Substance Use Topics  . Alcohol use: Yes  . Drug use: Yes    Types: Marijuana     Allergies   Patient has no known allergies.   Review of Systems Review of Systems 10 Systems reviewed and are negative for acute change except as noted in the HPI.   Physical Exam Updated Vital Signs Pulse 62   Ht 5\' 9"  (1.753 m)   Wt 80.3 kg   SpO2 98%   BMI 26.14 kg/m   Physical Exam Constitutional:      Comments: Patient is alert and nontoxic.  He is in no acute distress.  No respiratory distress.  Pleasant and interactive.  HENT:     Head: Normocephalic and atraumatic.     Mouth/Throat:     Comments: Poor dentition.  Airway patent. Eyes:     Extraocular Movements: Extraocular movements intact.  Cardiovascular:     Comments: Tachycardia.  Cannot appreciate rub murmur gallop at rate with monitor showing 1 60-1 70.  Distal pulses are intact. Pulmonary:     Comments: No respiratory distress.  Significantly decreased breath sounds from mid lung fields down.  Occasional expiratory wheeze. Abdominal:     General: There is no distension.     Palpations: Abdomen is soft.     Tenderness: There is no abdominal tenderness.  Musculoskeletal:     Comments: 2-3+ pitting edema bilateral lower extremities.  Calves are nontender.  Skin is thinned.  Several small wounds but none with appearance of secondary infection.  No significant cellulitis.  Skin:    General: Skin is warm and dry.  Neurological:     General: No focal deficit present.     Mental Status: He is oriented to person, place, and time.     Coordination: Coordination normal.  Psychiatric:        Mood and Affect: Mood normal.      ED Treatments / Results  Labs (all labs ordered are listed, but only abnormal results are  displayed) Labs Reviewed  BASIC METABOLIC PANEL  CBC  MAGNESIUM  PHOSPHORUS  TSH  I-STAT TROPONIN, ED    EKG EKG Interpretation  Date/Time:  Thursday September 03 2018 10:23:26 EST Ventricular Rate:  168 PR Interval:    QRS Duration: 91 QT Interval:  267 QTC Calculation: 448 R Axis:   -12 Text Interpretation:  Atrial fibrillation with rapid V-rate Anterior infarct, old Baseline wander in lead(s) V3 agree. new afib Confirmed by Charlesetta Shanks [phone]) on 09/03/2018 12:19:47 PM   Radiology No results found.  Procedures Procedures (including critical care time) CRITICAL CARE Performed by: Charlesetta Shanks   Total critical care time: 30 minutes  Critical care time was exclusive of separately billable procedures and treating other patients.  Critical care was necessary to treat or prevent imminent or life-threatening deterioration.  Critical care was time spent personally by me on the following activities: development of treatment plan with patient and/or surrogate as well as nursing, discussions with consultants, evaluation of patient's response to treatment, examination of patient, obtaining history from patient or surrogate, ordering and performing treatments and interventions, ordering and review of laboratory studies, ordering and review of radiographic studies, pulse oximetry and re-evaluation of patient's condition. Medications Ordered in ED Medications  sodium chloride flush (NS) 0.9 % injection 3 mL (has no administration in time range)  diltiazem (CARDIZEM) injection 15 mg (has no administration in time range)     Initial Impression / Assessment and Plan / ED Course  I have reviewed the triage vital signs and the nursing notes.  Pertinent labs & imaging results that were available during my care of the patient were reviewed by me and considered in my medical decision making (see chart for details).    Patient has severe comorbid illness with recent hospitalization  for hypoxic respiratory failure with squamous cell cancer of the left lung.  Patient had pleural effusion at that time.  He was discharged on doxycycline for pneumonia.  Since then patient reports he is actually feeling fairly well.  However he was identified today at ophthalmology to have high heart rate.  Patient presents with atrial fibrillation rapid ventricular response.  Cardizem has been administered.  Patient will be admitted.  Final Clinical Impressions(s) / ED Diagnoses   Final diagnoses:  Atrial fibrillation with RVR ([place])  Severe comorbid illness    ED Discharge Orders    None       Charlesetta Shanks, MD 09/05/18 1623

## 2018-09-03 NOTE — ED Notes (Signed)
Transported to [employer] by [employer]

## 2018-09-04 ENCOUNTER — Ambulatory Visit
Admission: RE | Admit: 2018-09-04 | Discharge: 2018-09-04 | Disposition: A | Discharge status: Home / Self Care | Payer: Self-pay | Source: Ambulatory Visit | Attending: Radiation Oncology | Admitting: Radiation Oncology

## 2018-09-04 ENCOUNTER — Other Ambulatory Visit: Payer: Self-pay

## 2018-09-04 DIAGNOSIS — R69 Illness, unspecified: Secondary | ICD-10-CM

## 2018-09-04 LAB — CBC
HEMATOCRIT: 33 % — AB (ref 39.0–52.0)
Hemoglobin: 10.1 g/dL — ABNORMAL LOW (ref 13.0–17.0)
MCH: 26.6 pg (ref 26.0–34.0)
MCHC: 30.6 g/dL (ref 30.0–36.0)
MCV: 86.8 fL (ref 80.0–100.0)
Platelets: 97 10*3/uL — ABNORMAL LOW (ref 150–400)
RBC: 3.8 MIL/uL — ABNORMAL LOW (ref 4.22–5.81)
RDW: 14.5 % (ref 11.5–15.5)
WBC: 29.6 10*3/uL — ABNORMAL HIGH (ref 4.0–10.5)
nRBC: 0 % (ref 0.0–0.2)

## 2018-09-04 LAB — BASIC METABOLIC PANEL
Anion gap: 10 (ref 5–15)
BUN: 31 mg/dL — ABNORMAL HIGH (ref 6–20)
CHLORIDE: 94 mmol/L — AB (ref 98–111)
CO2: 29 mmol/L (ref 22–32)
CREATININE: 1.1 mg/dL (ref 0.61–1.24)
Calcium: 6.2 mg/dL — CL (ref 8.9–10.3)
GFR calc Af Amer: >60 mL/min (ref >60)
GFR calc non Af Amer: >60 mL/min (ref >60)
Glucose, Bld: 272 mg/dL — ABNORMAL HIGH (ref 70–99)
POTASSIUM: 4.1 mmol/L (ref 3.5–5.1)
Sodium: 133 mmol/L — ABNORMAL LOW (ref 135–145)

## 2018-09-04 LAB — GLUCOSE, CAPILLARY
Glucose-Capillary: 194 mg/dL — ABNORMAL HIGH (ref 70–99)
Glucose-Capillary: 167 mg/dL — ABNORMAL HIGH (ref 70–99)
Glucose-Capillary: 238 mg/dL — ABNORMAL HIGH (ref 70–99)
Glucose-Capillary: 214 mg/dL — ABNORMAL HIGH (ref 70–99)
Glucose-Capillary: 264 mg/dL — ABNORMAL HIGH (ref 70–99)

## 2018-09-04 LAB — MAGNESIUM: Magnesium: 1.2 mg/dL — ABNORMAL LOW (ref 1.7–2.4)

## 2018-09-04 LAB — TROPONIN I
Troponin I: <0.03 ng/mL (ref <0.03)
Troponin I: <0.03 ng/mL (ref <0.03)

## 2018-09-04 MED ORDER — PRO-STAT SUGAR FREE PO LIQD
30.0000 mL | Freq: Two times a day (BID) | ORAL | Status: DC
  Administered 2018-09-04: 30 mL via ORAL
  Filled 2018-09-04: qty 30

## 2018-09-04 MED ORDER — MAGNESIUM SULFATE 4 GM/100ML IV SOLN
4.0000 g | Freq: Once | INTRAVENOUS | Status: AC
  Administered 2018-09-04: 4 g via INTRAVENOUS
  Filled 2018-09-04: qty 100

## 2018-09-04 MED ORDER — METOPROLOL TARTRATE 12.5 MG HALF TABLET
12.5000 mg | ORAL_TABLET | Freq: Two times a day (BID) | ORAL | Status: DC
  Administered 2018-09-04: 12.5 mg via ORAL
  Filled 2018-09-04 (×2): qty 1

## 2018-09-04 MED ORDER — BOOST / RESOURCE BREEZE PO LIQD CUSTOM
1.0000 | Freq: Three times a day (TID) | ORAL | Status: DC
  Administered 2018-09-04: 1 via ORAL

## 2018-09-04 MED ORDER — CALCIUM GLUCONATE-NACL 1-0.675 GM/50ML-% IV SOLN
1.0000 g | Freq: Once | INTRAVENOUS | Status: AC
  Administered 2018-09-04: 1000 mg via INTRAVENOUS
  Filled 2018-09-04: qty 50

## 2018-09-04 MED ORDER — MAGNESIUM CHLORIDE 64 MG PO TBEC
1.0000 | DELAYED_RELEASE_TABLET | Freq: Every day | ORAL | Status: DC
  Administered 2018-09-05: 64 mg via ORAL
  Filled 2018-09-04 (×2): qty 1

## 2018-09-04 MED ORDER — PROSIGHT PO TABS
1.0000 | ORAL_TABLET | Freq: Every day | ORAL | Status: DC
  Administered 2018-09-04 – 2018-09-06 (×3): 1 via ORAL
  Filled 2018-09-04 (×3): qty 1

## 2018-09-04 MED ORDER — RIVAROXABAN (XARELTO) EDUCATION KIT FOR AFIB PATIENTS
PACK | Freq: Once | Status: AC
  Administered 2018-09-04: 16:00:00
  Filled 2018-09-04: qty 1

## 2018-09-04 MED ORDER — SODIUM CHLORIDE 0.9 % IV BOLUS: 500.0000 mL | Freq: Once | INTRAVENOUS | Status: DC

## 2018-09-04 MED ORDER — SODIUM CHLORIDE 0.9 % IV BOLUS
500.0000 mL | Freq: Once | INTRAVENOUS | Status: AC
  Administered 2018-09-04: 500 mL via INTRAVENOUS

## 2018-09-04 NOTE — Evaluation (Signed)
Physical Therapy Evaluation Patient Details Name: Dylan Jacobs MRN: [id number] DOB: May 18, 1961 Today's Date: 09/04/2018   History of Present Illness  pt was admitted for AFib with RVR; he was scheduled for cataract sx and sent to [facility] by opthamologist.  Pt has lung CA and is undergoing chemo.  PMH: TiA, HTN, DM  Clinical Impression  Pt admitted with above diagnosis. Pt currently with functional limitations due to the deficits listed below (see PT Problem List).  Pt pleasant and cooperative  With PT, motivated to get stronger and go home; pt tachy/elevated HR limiting activity--see below for details; will continue to follow in acute setting Pt will benefit from skilled PT to increase their independence and safety with mobility to allow discharge to the venue listed below.       Follow Up Recommendations Home health PT;Supervision - Intermittent    Equipment Recommendations  Other (comment)(TBA)    Recommendations for Other Services       Precautions / Restrictions Precautions Precautions: Fall Precaution Comments: monitor Restrictions Weight Bearing Restrictions: No      Mobility  Bed Mobility Overal bed mobility: Modified Independent             General bed mobility comments: HOB raised  Transfers Overall transfer level: Needs assistance Equipment used: Rolling walker (2 wheeled) Transfers: Sit to/from Stand Sit to Stand: Min assist         General transfer comment: steadying assist upon rising  Ambulation/Gait Ambulation/Gait assistance: Min assist Gait Distance (Feet): 60 Feet Assistive device: None Gait Pattern/deviations: Step-through pattern;Narrow base of support;Decreased stride length     General Gait Details: unsteady gait however no overt LOB, min assist to steady without UE support  Stairs            Wheelchair Mobility    Modified Rankin (Stroke Patients Only)       Balance Overall balance assessment: Needs  assistance Sitting-balance support: No upper extremity supported;Feet supported Sitting balance-Leahy Scale: Good       Standing balance-Leahy Scale: Fair                               Pertinent Vitals/Pain Pain Assessment: Faces Faces Pain Scale: Hurts even more Pain Location: L ribs when moving arms and with bed mobility Pain Descriptors / Indicators: Aching;Grimacing;Sore Pain Intervention(s): Limited activity within patient's tolerance;Monitored during session    [place] expects to be discharged to:: Private residence Living Arrangements: Other relatives Available Help at Discharge: Family Type of Home: House       Home Layout: Multi-level Home Equipment: None Additional Comments: lives with brother who has neuropathy    Prior Function Level of Independence: Independent               Hand Dominance        Extremity/Trunk Assessment   Upper Extremity Assessment Upper Extremity Assessment: Defer to OT evaluation;Overall [facility] for tasks assessed    Lower Extremity Assessment Lower Extremity Assessment: Overall WFL for tasks assessed(denies sensory deficits)       Communication   Communication: No difficulties  Cognition Arousal/Alertness: Awake/alert Behavior During Therapy: WFL for tasks assessed/performed Overall Cognitive Status: Within Functional Limits for tasks assessed                                        General  Comments General comments (skin integrity, edema, etc.): HR 109 to max 137, tachy before and during mobility; SpO2=88-97% on RA    Exercises     Assessment/Plan    PT Assessment Patient needs continued PT services  PT Problem List Decreased mobility;Decreased activity tolerance;Decreased balance;Cardiopulmonary status limiting activity       PT Treatment Interventions DME instruction;Gait training;Functional mobility training;Therapeutic activities;Patient/family education;Balance  training;Therapeutic exercise    PT Goals (Current goals can be found in the Care Plan section)  Acute Rehab PT Goals Patient Stated Goal: home PT Goal Formulation: With patient Time For Goal Achievement: 09/18/18 Potential to Achieve Goals: Good    Frequency Min 3X/week   Barriers to discharge        Co-evaluation   Reason for Co-Treatment: For patient/therapist safety PT goals addressed during session: Mobility/safety with mobility OT goals addressed during session: ADL's and self-care       AM-PAC PT "6 Clicks" Mobility  Outcome Measure Help needed turning from your back to your side while in a flat bed without using bedrails?: None Help needed moving from lying on your back to sitting on the side of a flat bed without using bedrails?: None Help needed moving to and from a bed to a chair (including a wheelchair)?: A Little Help needed standing up from a chair using your arms (e.g., wheelchair or bedside chair)?: A Little Help needed to walk in hospital room?: A Little Help needed climbing 3-5 steps with a railing? : A Lot 6 Click Score: 19    End of Session Equipment Utilized During Treatment: Gait belt Activity Tolerance: Patient tolerated treatment well;Patient limited by fatigue Patient left: in chair;with call bell/phone within reach;with chair alarm set Nurse Communication: Mobility status PT Visit Diagnosis: Unsteadiness on feet (R26.81)    Time: 6378-5885 PT Time Calculation (min) (ACUTE ONLY): 16 min   Charges:   PT Evaluation $PT Eval Low Complexity: 1 Low          Kenyon Ana, PT  Pager: [phone] Acute Rehab Dept [facility]): [phone]   09/04/2018   [facility] 09/04/2018, 1:32 PM

## 2018-09-04 NOTE — Evaluation (Signed)
Occupational Therapy Evaluation Patient Details Name: Dylan Jacobs MRN: [id number] DOB: 1960/10/02 Today's Date: 09/04/2018    History of Present Illness pt was admitted for AFib with RVR; he was scheduled for cataract sx and sent to [facility] by opthamologist.  Pt has lung CA and is undergoing chemo.  PMH: TiA, HTN, DM   Clinical Impression   This 58 year old man was admitted for the above. At baseline, he is independent for mobility and adls. He lives with his brother, who has neuropathy.  Pt currently needs min A for balance/unsteadiness.  Anticipate he will progress to at least supervision level in acute setting.      Follow Up Recommendations  Supervision/Assistance - 24 hour    Equipment Recommendations  None recommended by OT(likely)    Recommendations for Other Services       Precautions / Restrictions Precautions Precautions: Fall Restrictions Weight Bearing Restrictions: No      Mobility Bed Mobility Overal bed mobility: Modified Independent             General bed mobility comments: HOB raised  Transfers Overall transfer level: Needs assistance Equipment used: Rolling walker (2 wheeled) Transfers: Sit to/from Stand Sit to Stand: Min assist         General transfer comment: steadying    Balance                                           ADL either performed or assessed with clinical judgement   ADL Overall ADL's : Needs assistance/impaired Eating/Feeding: Independent   Grooming: Set up   Upper Body Bathing: Set up   Lower Body Bathing: Minimal assistance   Upper Body Dressing : Set up   Lower Body Dressing: Minimal assistance   Toilet Transfer: Minimal assistance;Ambulation             General ADL Comments: min A for balance for LB adls/transfers     Vision         Perception     Praxis      Pertinent Vitals/Pain Pain Assessment: Faces Faces Pain Scale: Hurts even more Pain Location: L ribs when moving  arms Pain Descriptors / Indicators: Aching Pain Intervention(s): Limited activity within patient's tolerance;Monitored during session;Repositioned     Hand Dominance     Extremity/Trunk Assessment Upper Extremity Assessment Upper Extremity Assessment: Overall WFL for tasks assessed           Communication Communication Communication: No difficulties   Cognition Arousal/Alertness: Awake/alert Behavior During Therapy: WFL for tasks assessed/performed Overall Cognitive Status: Within Functional Limits for tasks assessed                                     General Comments  HR up to 137; sats 88-97% on RA    Exercises     Shoulder Instructions      Home Living Family/patient expects to be discharged to:: Private residence Living Arrangements: Other relatives Available Help at Discharge: Family Type of Home: House       Home Layout: Multi-level     Bathroom Shower/Tub: Teacher, early years/pre: Standard     Home Equipment: None   Additional Comments: lives with brother who has neuropathy      Prior Functioning/Environment Level of Independence: Independent  OT Problem List: Decreased strength;Decreased activity tolerance;Impaired balance (sitting and/or standing);Pain;Cardiopulmonary status limiting activity      OT Treatment/Interventions: Self-care/ADL training;Balance training;Patient/family education;Therapeutic activities;DME and/or AE instruction    OT Goals(Current goals can be found in the care plan section) Acute Rehab OT Goals Patient Stated Goal: home OT Goal Formulation: With patient Time For Goal Achievement: 09/18/18 Potential to Achieve Goals: Good ADL Goals Pt Will Transfer to Toilet: with supervision;ambulating;regular height toilet(vs 3:1 over) Pt Will Perform Tub/Shower Transfer: Tub transfer;with supervision(with dme as needed) Additional ADL Goal #1: pt will complete adl including  clothing retrieval at supervision level  OT Frequency: Min 2X/week   Barriers to D/C:            Co-evaluation PT/OT/SLP Co-Evaluation/Treatment: Yes Reason for Co-Treatment: For patient/therapist safety PT goals addressed during session: Mobility/safety with mobility OT goals addressed during session: ADL's and self-care      AM-PAC OT "6 Clicks" Daily Activity     Outcome Measure Help from another person eating meals?: None Help from another person taking care of personal grooming?: A Little Help from another person toileting, which includes using toliet, bedpan, or urinal?: A Little Help from another person bathing (including washing, rinsing, drying)?: A Little Help from another person to put on and taking off regular upper body clothing?: A Little Help from another person to put on and taking off regular lower body clothing?: A Little 6 Click Score: 19   End of Session    Activity Tolerance: Patient tolerated treatment well Patient left: in chair;with call bell/phone within reach;with bed alarm set  OT Visit Diagnosis: Unsteadiness on feet (R26.81)                Time: 9012-2241 OT Time Calculation (min): 16 min Charges:  OT General Charges $OT Visit: 1 Visit OT Evaluation $OT Eval Low Complexity: [place], OTR/L Acute Rehabilitation Services [phone] [facility] pager [phone] office 09/04/2018  [place] 09/04/2018, 12:26 PM

## 2018-09-04 NOTE — Discharge Instructions (Signed)

## 2018-09-04 NOTE — Progress Notes (Signed)
PROGRESS NOTE    Dylan Jacobs  DPO:[id number] DOB: 01-11-1961 DOA: 09/03/2018 PCP: [name], MD    Brief Narrative:  58 y.o. male with medical history significant of H/O TIA, HTN, DM2 (not started on therapy yet), Lung cancer undergoing current therapy, cataracts who presents for new onset Afib.  He reports that he was going to his ophthalmologists office today for cataract surgery.  He felt his normal self except some very mild lightheadedness.  Otherwise, reports no symptoms.  He has been having some chronic decreased appetite, low fluid intake, weight loss, thrush due to his chemotherapy and radiation.  He further has some LE swelling which has been off an on for the last 3-4 months, which he reports is due to not working on sitting around.  Further reports some looser stools, but not diarrhea and occasional chills.  He uses 3L of oxygen at home chronically.  He denies palpitations, chest pain, fever, change in urinary habits, dysuria, change in chronic cough, hemoptysis, pain in the abdomen, nausea, vomiting.  He has no apparent signs of infection, however, his WBC is elevated and his BP is suppressed (possibly related to HR).   ED Course: In the ED, he was noted to have a WBC of 29.  He was found to be in Afib with rates int he 160s.  He was given some rate controlling medication along with some fluids.  His Cr is up to 1.34 from 0.8 3 days ago.  His BS is 318.  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  Likely cause includes dehydration due to decreased PO Intake during chemotherapy, possibly acute reaction to chemotherapy (paclitaxel can cause cardiac arrhythmia in 1%, tachycardia in 3%) - He was  started on diltiazem drip at time of presentation with improvement in HR - Xarelto started, no signs of active bleeding - Continue on IVF hydration - HR improved, hypotensive. Will give fluid bolus and attempt to transition to PO Cardizem later as BP and HR tolerates  AKI, hyponatremia, hypomagnesemia - Renal function is acutely worse, BUN/Cr ratio is > 20, likely pre-renal - IVF with NS, bolus in the ED and then continued at 125cc/hr - EF is preserved based on TTE from 08/24/18 - Cr improving, although pt clinically dehydrated, receiving IVF -Hypotensive this afternoon with cardizem, giving IVF bolus  Leukocytosis - Patient received dexamethasone 100mg  on 1/13, continued on medrol dose pack at time of admit -.No evidence of infection  Stage III squamous cell carcinoma of left lung - He prefers to go to [facility] hospital so he can continue treatment - Ad[facility] to [facility] per [facility] request.  - Continue home pain management regimen - PT consulted  Chronic respiratory failure - continue home Oxygen 3L - Stable at this time    DM (diabetes mellitus)  - He has not started medications yet - On SSI. glucose stable    HTN (hypertension) - Blood pressure is running low at this time -Home BP meds on hold -Continued on cardizem -Give NS bolus for sbp in the 80's this afternoon  Oral thrush - Started diflucan - Stable at this time  DVT prophylaxis: xarelto Code Status: Full Family Communication: Pt in room,  family at bedside Disposition Plan: Uncertain at this time  Consultants:     Procedures:     Antimicrobials: Anti-infectives (From admission, onward)   Start     Dose/Rate Route Frequency Ordered Stop   09/03/18 2000  fluconazole (DIFLUCAN) tablet 100 mg     100 mg Oral Daily 09/03/18 1858         Subjective: Feels better. Eager to go home soon  Objective: Vitals:   09/04/18 0600 09/04/18 0630 09/04/18 0800 09/04/18 1200  BP: 90/67 134/73    Pulse: 96 80    Resp:  (!) 9 (!) 23    Temp:   97.6 F (36.4 C) 97.9 F (36.6 C)  TempSrc:   Oral Oral  SpO2: 100% (!) 87%    Weight:      Height:        Intake/Output Summary (Last 24 hours) at 09/04/2018 1339 Last data filed at 09/04/2018 0358 Gross per 24 hour  Intake 1198.86 ml  Output -  Net 1198.86 ml   Filed Weights   09/03/18 1026  Weight: 80.3 kg    Examination: General exam: Awake, laying in bed, in nad Respiratory system: Normal respiratory effort, no wheezing Cardiovascular system: tachycardic, s1, s2, irregularly irregular Gastrointestinal system: Soft, nondistended, positive BS Central nervous system: CN2-12 grossly intact, strength intact Extremities: Perfused, no clubbing Skin: Normal skin turgor, no notable skin lesions seen Psychiatry: Mood normal // no visual hallucinations   Data Reviewed: I have personally reviewed following labs and imaging studies  CBC: Recent Labs  Lab 08/31/18 1057 09/03/18 1027 09/04/18 0139  WBC 22.2* 29.5* 29.6*  NEUTROABS 20.7*  --   --   HGB 11.4* 12.2* 10.1*  HCT 35.3* 38.4* 33.0*  MCV 82.7 84.2 86.8  PLT 99* PLATELET CLUMPS NOTED ON SMEAR, UNABLE TO ESTIMATE 97*   Basic Metabolic Panel: Recent Labs  Lab 08/31/18 1057 09/03/18 1027 09/03/18 1123 09/04/18 0139  NA 134* 133*  --  133*  K 2.8* 3.5  --  4.1  CL 91* 90*  --  94*  CO2 35* 31  --  29  GLUCOSE 263* 318*  --  272*  BUN 10 32*  --  31*  CREATININE 0.80 1.34*  --  1.10  CALCIUM 7.1* 6.9*  --  6.2*  MG  --   --  1.1* 1.2*  PHOS  --   --  3.9  --    GFR: Estimated Creatinine Clearance: 72.3 mL/min (by C-G formula based on SCr of 1.1 mg/dL). Liver Function Tests: Recent Labs  Lab 08/31/18 1057  AST <6*  ALT 12  ALKPHOS 156*  BILITOT 0.9  PROT 5.9*  ALBUMIN 2.0*   No results for input(s): LIPASE, AMYLASE in the last 168 hours. No results for input(s): AMMONIA in the last 168 hours. Coagulation Profile: No results for input(s): INR, PROTIME in the last 168  hours. Cardiac Enzymes: Recent Labs  Lab 09/03/18 1908 09/04/18 0139 09/04/18 1007  TROPONINI <0.03 <0.03 <0.03   BNP (last 3 results) No results for input(s): PROBNP in the last 8760 hours. HbA1C: No results for input(s): HGBA1C in the last 72 hours. CBG: Recent Labs  Lab 09/03/18 1338 09/03/18 2235 09/04/18 0759 09/04/18 1031 09/04/18 1207  GLUCAP 289* 337* 194* 167* 238*   Lipid Profile: No results for input(s): CHOL, HDL, LDLCALC, TRIG, CHOLHDL, LDLDIRECT in the last 72 hours. Thyroid Function Tests: Recent Labs    09/03/18 1123  TSH 2.158   Anemia Panel:  No results for input(s): VITAMINB12, FOLATE, FERRITIN, TIBC, IRON, RETICCTPCT in the last 72 hours. Sepsis Labs: No results for input(s): PROCALCITON, LATICACIDVEN in the last 168 hours.  Recent Results (from the past 240 hour(s))  MRSA PCR Screening     Status: None   Collection Time: 09/03/18  7:02 PM  Result Value Ref Range Status   MRSA by PCR NEGATIVE NEGATIVE Final    Comment:        The GeneXpert MRSA Assay (FDA approved for NASAL specimens only), is one component of a comprehensive MRSA colonization surveillance program. It is not intended to diagnose MRSA infection nor to guide or monitor treatment for MRSA infections. Performed at [facility], [place] [address], [place], [place] [address]      Radiology Studies: Dg Chest 2 View  Result Date: 09/03/2018 CLINICAL DATA:  Irregular heartbeat and shortness of breath EXAM: CHEST - 2 VIEW COMPARISON:  08/24/2018 FINDINGS: Large pleural based density with central cavitation is again identified laterally similar to that seen on prior CT examination. No pneumothorax is seen. The right lung remains clear. No bony abnormality is noted. IMPRESSION: Stable mass in the left lung similar to that seen on the prior CT examination. No new acute abnormality is noted. Electronically Signed   By: Inez Catalina M.D.   On: 09/03/2018 11:16     Scheduled Meds: . feeding supplement  1 Container Oral TID BM  . feeding supplement (PRO-STAT SUGAR FREE 64)  30 mL Oral BID  . fluconazole  100 mg Oral Daily  . insulin aspart  0-5 Units Subcutaneous QHS  . insulin aspart  0-9 Units Subcutaneous TID WC  . [START ON 09/05/2018] magnesium chloride  1 tablet Oral Daily  . mouth rinse  15 mL Mouth Rinse BID  . methylPREDNISolone  4 mg Oral QAC breakfast  . multivitamin  1 tablet Oral Daily  . potassium chloride SA  40 mEq Oral Daily  . rivaroxaban   Does not apply Once  . rivaroxaban  20 mg Oral Q supper  . sodium chloride flush  3 mL Intravenous Once   Continuous Infusions: . sodium chloride 125 mL/hr at 09/04/18 0358  . diltiazem (CARDIZEM) infusion 5 mg/hr (09/04/18 0753)  . sodium chloride    . sodium chloride       LOS: 1 day   Marylu Lund, MD [facility] Pager On Amion  If 7PM-7AM, please contact night-coverage 09/04/2018, 1:39 PM

## 2018-09-04 NOTE — Progress Notes (Signed)
Inpatient Diabetes Program Recommendations  AACE/ADA: New Consensus Statement on Inpatient Glycemic Control (2015)  Target Ranges:  Prepandial:   less than 140 mg/dL      Peak postprandial:   less than 180 mg/dL (1-2 hours)      Critically ill patients:  140 - 180 mg/dL   Lab Results  Component Value Date   GLUCAP 238 (H) 09/04/2018   HGBA1C 8.0 (H) 08/24/2018    Review of Glycemic Control  Diabetes history: Type 2 Outpatient Diabetes medications: Glucotrol, Janumet (patient says he takes Metformin) Current orders for Inpatient glycemic control: Novolog SENSITIVE correction scale TID & HS  Inpatient Diabetes Program Recommendations:   Spoke with patient at the bedside. States that he has had diabetes for a very long time. States that he has not taken any medication for diabetes in a while. States that he has taken Metformin. Confused about what he should take and not take....confusion with the doctors. States that he has not been able to eat in the 2 weeks, but is just now starting to get an appetite again. Will continue to monitor blood sugars while in the hospital.  Harvel Ricks RN BSN CDE Diabetes Coordinator Pager: [phone]  8am-5pm

## 2018-09-04 NOTE — Progress Notes (Signed)
Initial Nutrition Assessment  DOCUMENTATION CODES:   Non-severe (moderate) malnutrition in context of chronic illness  INTERVENTION:  -Boost Breeze po TID, each supplement provides 250 kcal and 9 grams of protein (patient requested peach) -Magic cup TID with meals, each supplement provides 290 kcal and 9 grams of protein (patient requested chocolate) -Pro-stat BID; each supplement provides 100 kcal and 15 grams protein  -MVI -High Protein Nourishment Snacks   NUTRITION DIAGNOSIS:   Moderate Malnutrition related to   as evidenced by moderate fat depletion, moderate muscle depletion.   GOAL:   Patient will meet greater than or equal to 90% of their needs   MONITOR:   PO intake, Supplement acceptance, Weight trends  REASON FOR ASSESSMENT:   Malnutrition Screening Tool    ASSESSMENT:  58 year old patient with medical history significant of TIA, HTN, T2DM, lung cancer, decreased appetite, wt loss, thrush second to chemotherapy admitted with new onset A-fib and dehydration.    Previous [facility] ED to hospital admission: 01/05  Per MD note, patient asymptomatic a-fib is likely caused by dehydration due to decreased PO during chemotherapy  Very talkative patient sitting in chair with family in room at visit and reports that he is starving and endorses 100% of breakfast. Patient is not fond of facility food and expressed his dislike of Boost/Ensure.  Patient endorses recent taste changes and decreased PO intake due to thrush. Prior to receiving chemotherapy patient reports good PO and enjoyed cooking chicken and dumplings, pot roast, and ham. Patient recalls weighing 213 lbs 4 months ago and over 300 lbs 1.5 years ago. Patient diagnosed with having a mini stroke and stated that he quit drinking/smoking and began exercising, losing 100 lbs in a year.   Patient tells RD that he began drinking/smoking again (reporting half gallon of vodka each week) but has not had a drink in 4 months and  currently smokes 5 cigarettes/day.   Patient receptive to trying Boost Breeze in [employer] as well as chocolate MC    NUTRITION - FOCUSED PHYSICAL EXAM:    Most Recent Value  Orbital Region  Moderate depletion  Upper Arm Region  Moderate depletion  Thoracic and Lumbar Region  Mild depletion  Buccal Region  Moderate depletion  Temple Region  Moderate depletion  Clavicle Bone Region  Moderate depletion  Clavicle and Acromion Bone Region  Moderate depletion  Scapular Bone Region  Moderate depletion  Dorsal Hand  Moderate depletion  Patellar Region  Mild depletion  Anterior Thigh Region  Mild depletion  Posterior Calf Region  Mild depletion  Edema (RD Assessment)  Mild [non-pitting,  BLE]  Hair  Reviewed  Eyes  Reviewed  Mouth  Reviewed  Skin  Reviewed [dry,  cracked,  BLE]  Nails  Reviewed       Diet Order:   Diet Order            Diet Carb Modified Fluid consistency: Thin; Room service appropriate? Yes  Diet effective now              EDUCATION NEEDS:   No education needs have been identified at this time  Skin:  Skin Assessment: Reviewed RN Assessment  Last BM:  unknown  Height:   Ht Readings from Last 1 Encounters:  09/03/18 5\' 9"  (1.753 m)    Weight:   Wt Readings from Last 1 Encounters:  09/03/18 80.3 kg   08/31/18 80.7 kg  08/23/18 85.5 kg  08/17/18 91.2 kg  08/05/18 86.2 kg  07/31/18 86.6 kg  07/17/18 88.5 kg  07/10/18 88.5 kg    Ideal Body Weight:  72.7 kg  BMI:  Body mass index is 26.14 kg/m.  Estimated Nutritional Needs:   Kcal:  2015-2257  Protein:  112-128 g (1.3-1.6 g/kg)  Fluid:  </=2.2L/day    Lajuan Lines, RD, LDN  After Hours/Weekend Pager: [phone]

## 2018-09-04 NOTE — Progress Notes (Signed)
CRITICAL VALUE ALERT  Critical Value: Calcium 6.2   Date & Time Notied:  09/04/2018 0235   Provider Notified: Provider paged   Orders Received/Actions taken: Awaiting orders  Mariann Laster, RN

## 2018-09-04 NOTE — Care Management Note (Signed)
Case Management Note  Patient Details  Name: Dylan Jacobs MRN: [id number] Date of Birth: [birth year]  Subjective/Objective:                  Return to top of Atrial Fibrillation RRG - [place]  Discharge readiness is indicated by patient meeting Recovery Milestones, including ALL of the following: ? Hemodynamic stability  No bp 86/50 ? Sinus rhythm or acceptable ventricular rate a.fib ? No evidence of myocardial ischemia  Troponin-wnl ? Mental status at baseline yes ? Tachypnea absent  Resp. Rate 26 ? Hypoxemia absent o2 via [place] ? Anticoagulation regimen for next level of care established ? Yes Xarelto ? Ambulatory no ? Oral hydration, diet, and medications  Reg diet/iv cardizem drip/iv mgso4 x1 ? Level of care SDU not ready for next level   Action/Plan: Following for progression of care and condition. Following for cm needs none present at this time.  Expected Discharge Date:  (unknown)               Expected Discharge Plan:     In-House Referral:     Discharge planning Services     Post Acute Care Choice:    Choice offered to:     DME Arranged:    DME Agency:     HH Arranged:    HH Agency:     Status of Service:     If discussed at [employer], dates discussed:    Additional Comments:  Leeroy Cha, RN 09/04/2018, 9:46 AM

## 2018-09-05 LAB — BASIC METABOLIC PANEL
ANION GAP: 10 (ref 5–15)
BUN: 27 mg/dL — ABNORMAL HIGH (ref 6–20)
CO2: 25 mmol/L (ref 22–32)
Calcium: 6.5 mg/dL — ABNORMAL LOW (ref 8.9–10.3)
Chloride: 96 mmol/L — ABNORMAL LOW (ref 98–111)
Creatinine, Ser: 0.91 mg/dL (ref 0.61–1.24)
GFR calc Af Amer: >60 mL/min (ref >60)
GFR calc non Af Amer: >60 mL/min (ref >60)
Glucose, Bld: 203 mg/dL — ABNORMAL HIGH (ref 70–99)
POTASSIUM: 4.1 mmol/L (ref 3.5–5.1)
Sodium: 131 mmol/L — ABNORMAL LOW (ref 135–145)

## 2018-09-05 LAB — GLUCOSE, CAPILLARY
Glucose-Capillary: 200 mg/dL — ABNORMAL HIGH (ref 70–99)
Glucose-Capillary: 166 mg/dL — ABNORMAL HIGH (ref 70–99)
Glucose-Capillary: 162 mg/dL — ABNORMAL HIGH (ref 70–99)
Glucose-Capillary: 175 mg/dL — ABNORMAL HIGH (ref 70–99)

## 2018-09-05 LAB — MAGNESIUM: Magnesium: 1.8 mg/dL (ref 1.7–2.4)

## 2018-09-05 MED ORDER — LABETALOL HCL 5 MG/ML IV SOLN
5.0000 mg | INTRAVENOUS | Status: DC | PRN
  Administered 2018-09-06 (×2): 5 mg via INTRAVENOUS
  Filled 2018-09-05 (×2): qty 4

## 2018-09-05 MED ORDER — METOPROLOL TARTRATE 25 MG PO TABS
50.0000 mg | ORAL_TABLET | Freq: Two times a day (BID) | ORAL | Status: DC
  Administered 2018-09-05 – 2018-09-06 (×2): 50 mg via ORAL
  Filled 2018-09-05 (×2): qty 2

## 2018-09-05 MED ORDER — SODIUM CHLORIDE 0.9 % IV SOLN
1000.0000 mL | INTRAVENOUS | Status: DC
  Administered 2018-09-05 – 2018-09-06 (×3): 1000 mL via INTRAVENOUS

## 2018-09-05 MED ORDER — METOPROLOL TARTRATE 25 MG PO TABS
25.0000 mg | ORAL_TABLET | Freq: Two times a day (BID) | ORAL | Status: DC
  Administered 2018-09-05: 25 mg via ORAL
  Filled 2018-09-05: qty 1

## 2018-09-05 MED ORDER — SODIUM CHLORIDE 0.9 % IV BOLUS
500.0000 mL | Freq: Once | INTRAVENOUS | Status: AC
  Administered 2018-09-05: 500 mL via INTRAVENOUS

## 2018-09-05 NOTE — Progress Notes (Signed)
Pharmacy:Xarelto Pt given 30 day free xarelto card and education kit w/ 10$ copay card along with Xarelto education  Eudelia Bunch, Pharm.D [phone] 09/05/2018 2:41 PM

## 2018-09-05 NOTE — Progress Notes (Signed)
PROGRESS NOTE    Dylan Jacobs  HCW:[id number] DOB: 1961/02/13 DOA: 09/03/2018 PCP: [name], MD    Brief Narrative:  58 y.o. male with medical history significant of H/O TIA, HTN, DM2 (not started on therapy yet), Lung cancer undergoing current therapy, cataracts who presents for new onset Afib.  He reports that he was going to his ophthalmologists office today for cataract surgery.  He felt his normal self except some very mild lightheadedness.  Otherwise, reports no symptoms.  He has been having some chronic decreased appetite, low fluid intake, weight loss, thrush due to his chemotherapy and radiation.  He further has some LE swelling which has been off an on for the last 3-4 months, which he reports is due to not working on sitting around.  Further reports some looser stools, but not diarrhea and occasional chills.  He uses 3L of oxygen at home chronically.  He denies palpitations, chest pain, fever, change in urinary habits, dysuria, change in chronic cough, hemoptysis, pain in the abdomen, nausea, vomiting.  He has no apparent signs of infection, however, his WBC is elevated and his BP is suppressed (possibly related to HR).   ED Course: In the ED, he was noted to have a WBC of 29.  He was found to be in Afib with rates int he 160s.  He was given some rate controlling medication along with some fluids.  His Cr is up to 1.34 from 0.8 3 days ago.  His BS is 318.  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  Likely cause includes dehydration due to decreased PO Intake during chemotherapy, possibly acute reaction to chemotherapy (paclitaxel can cause cardiac arrhythmia in 1%, tachycardia in 3%) - He was  started on diltiazem drip at time of presentation with improvement in HR - Xarelto started, no signs of active bleeding - Continue on IVF hydration - Have added metoprolol with continued cardizem, weaning as tolerated -HR is improving. Goal to wean cardizem gtt to off  AKI, hyponatremia, hypomagnesemia - Renal function is acutely worse, BUN/Cr ratio is > 20, likely pre-renal - IVF with NS, bolus in the ED and then continued at 125cc/hr - EF is preserved based on TTE from 08/24/18 - Cr improving, although pt clinically dehydrated, receiving IVF -Repeat bmet in AM  Leukocytosis - Patient received dexamethasone 100mg  on 1/13, continued on medrol dose pack at time of admit -.No evidence of infection -Afebrile -repeat cbc in AM  Stage III squamous cell carcinoma of left lung - He prefers to go to [facility] hospital so he can continue treatment - [facility] to [facility] request.  - Continue home pain management regimen - PT consulted, recommendation for home health PT  Chronic respiratory failure - continue home Oxygen 3L - Stable at this time    DM (diabetes mellitus)  - He has not started medications yet - On SSI. glucose remains stable    HTN (hypertension) - Blood pressure is running low at this time -Home BP meds on hold -Continued on cardizem gtt -added metoprolol per above  Oral thrush - Started diflucan - Stable at this time  DVT prophylaxis: xarelto Code Status: Full Family Communication: Pt in  room, family at bedside Disposition Plan: Uncertain at this time  Consultants:     Procedures:     Antimicrobials: Anti-infectives (From admission, onward)   Start     Dose/Rate Route Frequency Ordered Stop   09/03/18 2000  fluconazole (DIFLUCAN) tablet 100 mg     100 mg Oral Daily 09/03/18 1858        Subjective: Wanting to go home. Still on cardizem  Objective: Vitals:   09/05/18 1300 09/05/18 1400 09/05/18 1500 09/05/18 1510  BP: 119/73 (!) 94/55 112/62     Pulse: 99 (!) 102 (!) 59   Resp: 16 17 19    Temp:    97.7 F (36.5 C)  TempSrc:    Oral  SpO2: 97% 96% 96%   Weight:      Height:        Intake/Output Summary (Last 24 hours) at 09/05/2018 1555 Last data filed at 09/05/2018 1500 Gross per 24 hour  Intake 3695.51 ml  Output -  Net 3695.51 ml   Filed Weights   09/03/18 1026  Weight: 80.3 kg    Examination: General exam: Conversant, in no acute distress Respiratory system: normal chest rise, clear, no audible wheezing Cardiovascular system: irregularly irregular, s1, s2 Gastrointestinal system: Nondistended, nontender, pos BS Central nervous system: No seizures, no tremors Extremities: No cyanosis, no joint deformities Skin: No rashes, no pallor Psychiatry: Affect normal // no auditory hallucinations   Data Reviewed: I have personally reviewed following labs and imaging studies  CBC: Recent Labs  Lab 08/31/18 1057 09/03/18 1027 09/04/18 0139  WBC 22.2* 29.5* 29.6*  NEUTROABS 20.7*  --   --   HGB 11.4* 12.2* 10.1*  HCT 35.3* 38.4* 33.0*  MCV 82.7 84.2 86.8  PLT 99* PLATELET CLUMPS NOTED ON SMEAR, UNABLE TO ESTIMATE 97*   Basic Metabolic Panel: Recent Labs  Lab 08/31/18 1057 09/03/18 1027 09/03/18 1123 09/04/18 0139 09/05/18 0454  NA 134* 133*  --  133* 131*  K 2.8* 3.5  --  4.1 4.1  CL 91* 90*  --  94* 96*  CO2 35* 31  --  29 25  GLUCOSE 263* 318*  --  272* 203*  BUN 10 32*  --  31* 27*  CREATININE 0.80 1.34*  --  1.10 0.91  CALCIUM 7.1* 6.9*  --  6.2* 6.5*  MG  --   --  1.1* 1.2* 1.8  PHOS  --   --  3.9  --   --    GFR: Estimated Creatinine Clearance: 87.4 mL/min (by C-G formula based on SCr of 0.91 mg/dL). Liver Function Tests: Recent Labs  Lab 08/31/18 1057  AST <6*  ALT 12  ALKPHOS 156*  BILITOT 0.9  PROT 5.9*  ALBUMIN 2.0*   No results for input(s): LIPASE, AMYLASE in the last 168 hours. No results for input(s): AMMONIA in the last 168 hours. Coagulation Profile: No results for  input(s): INR, PROTIME in the last 168 hours. Cardiac Enzymes: Recent Labs  Lab 09/03/18 1908 09/04/18 0139 09/04/18 1007  TROPONINI <0.03 <0.03 <0.03   BNP (last 3 results) No results for input(s): PROBNP in the last 8760 hours. HbA1C: No results for input(s): HGBA1C in the last 72 hours. CBG: Recent Labs  Lab 09/04/18 1207 09/04/18 1718 09/04/18 2202 09/05/18 0757 09/05/18 1220  GLUCAP 238* 214* 264* 200* 166*   Lipid Profile: No results for input(s): CHOL, HDL, LDLCALC, TRIG, CHOLHDL, LDLDIRECT in the last 72 hours. Thyroid Function Tests: Recent Labs  09/03/18 1123  TSH 2.158   Anemia Panel: No results for input(s): VITAMINB12, FOLATE, FERRITIN, TIBC, IRON, RETICCTPCT in the last 72 hours. Sepsis Labs: No results for input(s): PROCALCITON, LATICACIDVEN in the last 168 hours.  Recent Results (from the past 240 hour(s))  MRSA PCR Screening     Status: None   Collection Time: 09/03/18  7:02 PM  Result Value Ref Range Status   MRSA by PCR NEGATIVE NEGATIVE Final    Comment:        The GeneXpert MRSA Assay (FDA approved for NASAL specimens only), is one component of a comprehensive MRSA colonization surveillance program. It is not intended to diagnose MRSA infection nor to guide or monitor treatment for MRSA infections. Performed at [facility], [place] [address] [place], [place] [address]      Radiology Studies: No results found.  Scheduled Meds: . feeding supplement  1 Container Oral TID BM  . feeding supplement (PRO-STAT SUGAR FREE 64)  30 mL Oral BID  . fluconazole  100 mg Oral Daily  . insulin aspart  0-5 Units Subcutaneous QHS  . insulin aspart  0-9 Units Subcutaneous TID WC  . magnesium chloride  1 tablet Oral Daily  . mouth rinse  15 mL Mouth Rinse BID  . methylPREDNISolone  4 mg Oral QAC breakfast  . metoprolol tartrate  25 mg Oral BID  . multivitamin  1 tablet Oral Daily  . potassium chloride SA  40 mEq Oral Daily  .  rivaroxaban  20 mg Oral Q supper  . sodium chloride flush  3 mL Intravenous Once   Continuous Infusions: . sodium chloride 1,000 mL (09/05/18 0840)  . diltiazem (CARDIZEM) infusion 5 mg/hr (09/05/18 1259)     LOS: 2 days   Marylu Lund, MD [facility] Pager On Amion  If 7PM-7AM, please contact night-coverage 09/05/2018, 3:55 PM

## 2018-09-06 LAB — BASIC METABOLIC PANEL
Anion gap: 8 (ref 5–15)
BUN: 25 mg/dL — ABNORMAL HIGH (ref 6–20)
CALCIUM: 6.6 mg/dL — AB (ref 8.9–10.3)
CO2: 25 mmol/L (ref 22–32)
Chloride: 100 mmol/L (ref 98–111)
Creatinine, Ser: 0.88 mg/dL (ref 0.61–1.24)
GFR calc Af Amer: >60 mL/min (ref >60)
GFR calc non Af Amer: >60 mL/min (ref >60)
Glucose, Bld: 170 mg/dL — ABNORMAL HIGH (ref 70–99)
Potassium: 4.3 mmol/L (ref 3.5–5.1)
SODIUM: 133 mmol/L — AB (ref 135–145)

## 2018-09-06 LAB — MAGNESIUM: Magnesium: 1.6 mg/dL — ABNORMAL LOW (ref 1.7–2.4)

## 2018-09-06 LAB — GLUCOSE, CAPILLARY
GLUCOSE-CAPILLARY: 236 mg/dL — AB (ref 70–99)
Glucose-Capillary: 135 mg/dL — ABNORMAL HIGH (ref 70–99)
Glucose-Capillary: 248 mg/dL — ABNORMAL HIGH (ref 70–99)

## 2018-09-06 MED ORDER — HALOPERIDOL LACTATE 5 MG/ML IJ SOLN: 2.0000 mg | Freq: Once | INTRAMUSCULAR | Status: DC

## 2018-09-06 MED ORDER — MAGNESIUM SULFATE 4 GM/100ML IV SOLN
4.0000 g | Freq: Once | INTRAVENOUS | Status: AC
  Administered 2018-09-06: 4 g via INTRAVENOUS
  Filled 2018-09-06: qty 100

## 2018-09-06 MED ORDER — MAGNESIUM CHLORIDE 64 MG PO TBEC: 1.0000 | DELAYED_RELEASE_TABLET | Freq: Every day | ORAL | Status: DC

## 2018-09-06 MED ORDER — NICOTINE 14 MG/24HR TD PT24
14.0000 mg | MEDICATED_PATCH | Freq: Every day | TRANSDERMAL | Status: DC
  Administered 2018-09-06: 14 mg via TRANSDERMAL
  Filled 2018-09-06: qty 1

## 2018-09-06 MED ORDER — METOPROLOL TARTRATE 25 MG PO TABS: 100.0000 mg | ORAL_TABLET | Freq: Two times a day (BID) | ORAL | Status: DC

## 2018-09-06 NOTE — Progress Notes (Signed)
PROGRESS NOTE    Dylan Jacobs  QIH:[id number] DOB: 1960/10/18 DOA: 09/03/2018 PCP: [name], MD    Brief Narrative:  58 y.o. male with medical history significant of H/O TIA, HTN, DM2 (not started on therapy yet), Lung cancer undergoing current therapy, cataracts who presents for new onset Afib.  He reports that he was going to his ophthalmologists office today for cataract surgery.  He felt his normal self except some very mild lightheadedness.  Otherwise, reports no symptoms.  He has been having some chronic decreased appetite, low fluid intake, weight loss, thrush due to his chemotherapy and radiation.  He further has some LE swelling which has been off an on for the last 3-4 months, which he reports is due to not working on sitting around.  Further reports some looser stools, but not diarrhea and occasional chills.  He uses 3L of oxygen at home chronically.  He denies palpitations, chest pain, fever, change in urinary habits, dysuria, change in chronic cough, hemoptysis, pain in the abdomen, nausea, vomiting.  He has no apparent signs of infection, however, his WBC is elevated and his BP is suppressed (possibly related to HR).   ED Course: In the ED, he was noted to have a WBC of 29.  He was found to be in Afib with rates int he 160s.  He was given some rate controlling medication along with some fluids.  His Cr is up to 1.34 from 0.8 3 days ago.  His BS is 318.  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  Likely cause includes dehydration due to decreased PO Intake during chemotherapy, possibly acute reaction to chemotherapy (paclitaxel can cause cardiac arrhythmia in 1%, tachycardia in 3%) - He was  started on diltiazem drip at time of presentation with improvement in HR - Xarelto started, no signs of active bleeding - Continue on IVF hydration - Have added metoprolol with continued cardizem, weaning as tolerated - Cardizem intermittently turned off overnight secondary to hypotension - Continue to titrate metoprolol up, now to 100mg  BID with PRN labetalol IV  AKI, hyponatremia, hypomagnesemia - Renal function is acutely worse, BUN/Cr ratio is > 20, likely pre-renal - IVF with NS, bolus in the ED and then continued at 125cc/hr - EF is preserved based on TTE from 08/24/18 - Cr improving, although pt clinically dehydrated, receiving IVF - Mg low this AM at 1.6. Will replace  Leukocytosis - Patient received dexamethasone 100mg  on 1/13, continued on medrol dose pack at time of admit -.No evidence of infection -Afebrile -will recheck CBC in AM  Stage III squamous cell carcinoma of left lung - He prefers to go to [facility] hospital so he can continue treatment - Ad[facility] to [facility] per [facility] request.  - Continue home pain management regimen - PT consulted, recommendation for home health PT - Undergoing radiation tx. Will alert oncologist of pt's admission  Chronic respiratory failure - continue home Oxygen 3L - Stable at this time    DM (diabetes mellitus)  - He has not started medications yet - On SSI. Presently stable    HTN (hypertension) - Blood pressure is running low at this time -Home BP meds on hold -Cardizem gtt  weaned to off this afternoon -added metoprolol per above, titrating as tolerate  Oral thrush - Started diflucan - Stable at this time  DVT prophylaxis: xarelto Code Status: Full Family Communication: Pt in room, family at bedside Disposition Plan: Uncertain at this time  Consultants:     Procedures:     Antimicrobials: Anti-infectives (From admission, onward)   Start     Dose/Rate Route Frequency Ordered Stop   09/03/18 2000  fluconazole (DIFLUCAN)  tablet 100 mg     100 mg Oral Daily 09/03/18 1858        Subjective: Eager to go home  Objective: Vitals:   09/06/18 1200 09/06/18 1300 09/06/18 1400 09/06/18 1500  BP: 119/77 125/84 123/78 123/67  Pulse: (!) 50 (!) 106 (!) 121 (!) 113  Resp: (!) 25 19 (!) 21 20  Temp: (!) 97.5 F (36.4 C)     TempSrc: Oral     SpO2: 93% 100% 91% 94%  Weight:      Height:        Intake/Output Summary (Last 24 hours) at 09/06/2018 1531 Last data filed at 09/06/2018 1500 Gross per 24 hour  Intake 1090 ml  Output -  Net 1090 ml   Filed Weights   09/03/18 1026  Weight: 80.3 kg    Examination: General exam: Awake, laying in bed, in nad Respiratory system: Normal respiratory effort, no wheezing Cardiovascular system: tachycardic, s1, s2 Gastrointestinal system: Soft, nondistended, positive BS Central nervous system: CN2-12 grossly intact, strength intact Extremities: Perfused, no clubbing Skin: Normal skin turgor, no notable skin lesions seen Psychiatry: Mood normal // no visual hallucinations   Data Reviewed: I have personally reviewed following labs and imaging studies  CBC: Recent Labs  Lab 08/31/18 1057 09/03/18 1027 09/04/18 0139  WBC 22.2* 29.5* 29.6*  NEUTROABS 20.7*  --   --   HGB 11.4* 12.2* 10.1*  HCT 35.3* 38.4* 33.0*  MCV 82.7 84.2 86.8  PLT 99* PLATELET CLUMPS NOTED ON SMEAR, UNABLE TO ESTIMATE 97*   Basic Metabolic Panel: Recent Labs  Lab 08/31/18 1057 09/03/18 1027 09/03/18 1123 09/04/18 0139 09/05/18 0454 09/06/18 0304  NA 134* 133*  --  133* 131* 133*  K 2.8* 3.5  --  4.1 4.1 4.3  CL 91* 90*  --  94* 96* 100  CO2 35* 31  --  29 25 25   GLUCOSE 263* 318*  --  272* 203* 170*  BUN 10 32*  --  31* 27* 25*  CREATININE 0.80 1.34*  --  1.10 0.91 0.88  CALCIUM 7.1* 6.9*  --  6.2* 6.5* 6.6*  MG  --   --  1.1* 1.2* 1.8 1.6*  PHOS  --   --  3.9  --   --   --    GFR: Estimated Creatinine Clearance: 90.4 mL/min (by C-G formula based on SCr of 0.88  mg/dL). Liver Function Tests: Recent Labs  Lab 08/31/18 1057  AST <6*  ALT 12  ALKPHOS 156*  BILITOT 0.9  PROT 5.9*  ALBUMIN 2.0*   No results for input(s): LIPASE, AMYLASE in the last 168 hours. No results for input(s): AMMONIA in the last 168 hours. Coagulation Profile: No results for input(s): INR, PROTIME in the last 168 hours. Cardiac Enzymes: Recent Labs  Lab 09/03/18 1908 09/04/18 0139 09/04/18 1007  TROPONINI <0.03 <0.03 <0.03   BNP (last 3 results) No results for input(s): PROBNP in the last 8760 hours. HbA1C: No results for input(s): HGBA1C in the last 72 hours.  CBG: Recent Labs  Lab 09/05/18 1220 09/05/18 1703 09/05/18 2111 09/06/18 0750 09/06/18 1239  GLUCAP 166* 162* 175* 135* 248*   Lipid Profile: No results for input(s): CHOL, HDL, LDLCALC, TRIG, CHOLHDL, LDLDIRECT in the last 72 hours. Thyroid Function Tests: No results for input(s): TSH, T4TOTAL, FREET4, T3FREE, THYROIDAB in the last 72 hours. Anemia Panel: No results for input(s): VITAMINB12, FOLATE, FERRITIN, TIBC, IRON, RETICCTPCT in the last 72 hours. Sepsis Labs: No results for input(s): PROCALCITON, LATICACIDVEN in the last 168 hours.  Recent Results (from the past 240 hour(s))  MRSA PCR Screening     Status: None   Collection Time: 09/03/18  7:02 PM  Result Value Ref Range Status   MRSA by PCR NEGATIVE NEGATIVE Final    Comment:        The GeneXpert MRSA Assay (FDA approved for NASAL specimens only), is one component of a comprehensive MRSA colonization surveillance program. It is not intended to diagnose MRSA infection nor to guide or monitor treatment for MRSA infections. Performed at [facility], [place] [address] [place], [place] [address]      Radiology Studies: No results found.  Scheduled Meds: . feeding supplement  1 Container Oral TID BM  . feeding supplement (PRO-STAT SUGAR FREE 64)  30 mL Oral BID  . fluconazole  100 mg Oral Daily  . insulin  aspart  0-5 Units Subcutaneous QHS  . insulin aspart  0-9 Units Subcutaneous TID WC  . [START ON 09/07/2018] magnesium chloride  1 tablet Oral Daily  . mouth rinse  15 mL Mouth Rinse BID  . methylPREDNISolone  4 mg Oral QAC breakfast  . metoprolol tartrate  100 mg Oral BID  . multivitamin  1 tablet Oral Daily  . nicotine  14 mg Transdermal Daily  . potassium chloride SA  40 mEq Oral Daily  . rivaroxaban  20 mg Oral Q supper  . sodium chloride flush  3 mL Intravenous Once   Continuous Infusions: . sodium chloride 1,000 mL (09/06/18 0459)  . diltiazem (CARDIZEM) infusion Stopped (09/06/18 1058)     LOS: 3 days   Marylu Lund, MD [facility] Pager On Amion  If 7PM-7AM, please contact night-coverage 09/06/2018, 3:31 PM

## 2018-09-06 NOTE — Progress Notes (Signed)
Called to the bedside because pt is wanting to leave AMA. On assessment, pt is A/Ox3.  Pt states that he has been in the hospital for at least 3 days and can no longer withstand being here. He would like to go home. He was made aware of the risks of leaving against medical advise and he was encouraged to stay. However, he insists on leaving at this time. Brother was present at time of encounter. Advised nurse to have pt sign AMA papers.   Lovey Newcomer, NP [facility] 7p-7a [phone]

## 2018-09-06 NOTE — Progress Notes (Signed)
Pt signed [place] paperwork and left AMA.  Mariann Laster, RN

## 2018-09-06 NOTE — Progress Notes (Signed)
5mg  labatalol prn given for heart rate of 122

## 2018-09-07 ENCOUNTER — Ambulatory Visit
Admission: RE | Admit: 2018-09-07 | Discharge: 2018-09-07 | Disposition: A | Discharge status: Home / Self Care | Payer: Self-pay | Source: Ambulatory Visit | Attending: Radiation Oncology | Admitting: Radiation Oncology

## 2018-09-07 ENCOUNTER — Inpatient Hospital Stay: Payer: Self-pay | Admitting: Nutrition

## 2018-09-07 ENCOUNTER — Inpatient Hospital Stay: Payer: Self-pay

## 2018-09-07 ENCOUNTER — Ambulatory Visit: Payer: Self-pay | Admitting: Nutrition

## 2018-09-07 VITALS — BP 114/73 | HR 136 | Temp 98.0°F | Resp 20

## 2018-09-07 DIAGNOSIS — C3492 Malignant neoplasm of unspecified part of left bronchus or lung: Secondary | ICD-10-CM

## 2018-09-07 DIAGNOSIS — C3490 Malignant neoplasm of unspecified part of unspecified bronchus or lung: Secondary | ICD-10-CM

## 2018-09-07 LAB — CMP (CANCER CENTER ONLY)
ALT: 11 U/L (ref 0–44)
AST: 7 U/L — ABNORMAL LOW (ref 15–41)
Albumin: 2.1 g/dL — ABNORMAL LOW (ref 3.5–5.0)
Alkaline Phosphatase: 169 U/L — ABNORMAL HIGH (ref 38–126)
Anion gap: 11 (ref 5–15)
BILIRUBIN TOTAL: 1.7 mg/dL — AB (ref 0.3–1.2)
BUN: 21 mg/dL — ABNORMAL HIGH (ref 6–20)
CO2: 22 mmol/L (ref 22–32)
Calcium: 7.5 mg/dL — ABNORMAL LOW (ref 8.9–10.3)
Chloride: 102 mmol/L (ref 98–111)
Creatinine: 0.86 mg/dL (ref 0.61–1.24)
GFR, Est AFR Am: >60 mL/min (ref >60)
GFR, Estimated: >60 mL/min (ref >60)
Glucose, Bld: 178 mg/dL — ABNORMAL HIGH (ref 70–99)
Potassium: 4.8 mmol/L (ref 3.5–5.1)
Sodium: 135 mmol/L (ref 135–145)
TOTAL PROTEIN: 5.8 g/dL — AB (ref 6.5–8.1)

## 2018-09-07 LAB — CBC WITH DIFFERENTIAL (CANCER CENTER ONLY)
Abs Immature Granulocytes: 0.06 10*3/uL (ref 0.00–0.07)
Basophils Absolute: 0 10*3/uL (ref 0.0–0.1)
Basophils Relative: 0 %
Eosinophils Absolute: 0.1 10*3/uL (ref 0.0–0.5)
Eosinophils Relative: 1 %
HEMATOCRIT: 36.8 % — AB (ref 39.0–52.0)
Hemoglobin: 11.1 g/dL — ABNORMAL LOW (ref 13.0–17.0)
Immature Granulocytes: 1 %
Lymphocytes Relative: 3 %
Lymphs Abs: 0.3 10*3/uL — ABNORMAL LOW (ref 0.7–4.0)
MCH: 26.2 pg (ref 26.0–34.0)
MCHC: 30.2 g/dL (ref 30.0–36.0)
MCV: 87 fL (ref 80.0–100.0)
Monocytes Absolute: 0.3 10*3/uL (ref 0.1–1.0)
Monocytes Relative: 3 %
Neutro Abs: 9.9 10*3/uL — ABNORMAL HIGH (ref 1.7–7.7)
Neutrophils Relative %: 92 %
Platelet Count: 84 10*3/uL — ABNORMAL LOW (ref 150–400)
RBC: 4.23 MIL/uL (ref 4.22–5.81)
RDW: 14.6 % (ref 11.5–15.5)
WBC Count: 10.7 10*3/uL — ABNORMAL HIGH (ref 4.0–10.5)
nRBC: 0 % (ref 0.0–0.2)

## 2018-09-07 MED ORDER — PALONOSETRON HCL INJECTION 0.25 MG/5ML
INTRAVENOUS | Status: AC
  Filled 2018-09-07: qty 5

## 2018-09-07 MED ORDER — DIPHENHYDRAMINE HCL 50 MG/ML IJ SOLN
50.0000 mg | Freq: Once | INTRAMUSCULAR | Status: AC
  Administered 2018-09-07: 50 mg via INTRAVENOUS

## 2018-09-07 MED ORDER — PALONOSETRON HCL INJECTION 0.25 MG/5ML
0.2500 mg | Freq: Once | INTRAVENOUS | Status: AC
  Administered 2018-09-07: 0.25 mg via INTRAVENOUS

## 2018-09-07 MED ORDER — SODIUM CHLORIDE 0.9 % IV SOLN
Freq: Once | INTRAVENOUS | Status: AC
  Administered 2018-09-07: 12:00:00 via INTRAVENOUS
  Filled 2018-09-07: qty 250

## 2018-09-07 MED ORDER — FAMOTIDINE IN NACL 20-0.9 MG/50ML-% IV SOLN
20.0000 mg | Freq: Once | INTRAVENOUS | Status: AC
  Administered 2018-09-07: 20 mg via INTRAVENOUS

## 2018-09-07 MED ORDER — DIPHENHYDRAMINE HCL 50 MG/ML IJ SOLN
INTRAMUSCULAR | Status: AC
  Filled 2018-09-07: qty 1

## 2018-09-07 MED ORDER — SODIUM CHLORIDE 0.9 % IV SOLN
230.0000 mg | Freq: Once | INTRAVENOUS | Status: AC
  Administered 2018-09-07: 230 mg via INTRAVENOUS
  Filled 2018-09-07: qty 23

## 2018-09-07 MED ORDER — FAMOTIDINE IN NACL 20-0.9 MG/50ML-% IV SOLN
INTRAVENOUS | Status: AC
  Filled 2018-09-07: qty 50

## 2018-09-07 MED ORDER — SODIUM CHLORIDE 0.9 % IV SOLN
45.0000 mg/m2 | Freq: Once | INTRAVENOUS | Status: AC
  Administered 2018-09-07: 90 mg via INTRAVENOUS
  Filled 2018-09-07: qty 15

## 2018-09-07 MED ORDER — SODIUM CHLORIDE 0.9 % IV SOLN
20.0000 mg | Freq: Once | INTRAVENOUS | Status: AC
  Administered 2018-09-07: 20 mg via INTRAVENOUS
  Filled 2018-09-07: qty 2

## 2018-09-07 NOTE — Progress Notes (Signed)
Per Dr. Julien Nordmann, okay to treat with HR 136 and to have pt follow up with PCP regarding afib.  Also per Dr. Julien Nordmann, okay to treat with abnormal labs.

## 2018-09-07 NOTE — Discharge Summary (Signed)
Physician Discharge Summary  Dylan Jacobs IDP:[id number] DOB: 1961/01/09 DOA: 09/03/2018  PCP: [name], MD  Admit date: 09/03/2018 Discharge date: 09/07/2018  Admitted From: Home Disposition:  Left [place] [facility]  Brief/Interim Summary: 58 y.o.malewith medical history significant ofH/O TIA, HTN, DM2 (not started on therapy yet), Lung cancer undergoing current therapy, cataracts who presents for new onset Afib. He reports that he was going to his ophthalmologists office today for cataract surgery. He felt his normal self except some very mild lightheadedness. Otherwise, reports no symptoms. He has been having some chronic decreased appetite, low fluid intake, weight loss, thrush due to his chemotherapy and radiation. He further has some LE swelling which has been off an on for the last 3-4 months, which he reports is due to not working on sitting around. Further reports some looser stools, but not diarrhea and occasional chills. He uses 3L of oxygen at home chronically. He denies palpitations, chest pain, fever, change in urinary habits, dysuria, change in chronic cough, hemoptysis, pain in the abdomen, nausea, vomiting. He has no apparent signs of infection, however, his WBC is elevated and his BP is suppressed (possibly related to HR).   ED Course:In the ED, he was noted to have a WBC of 29. He was found to be in Afib with rates int he 160s. He was given some rate controlling medication along with some fluids. His Cr is up to 1.34 from 0.8 3 days ago. His BS is 318. 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 Likely cause includes dehydration due to decreased PO  Intake during chemotherapy, possibly acute reaction to chemotherapy (paclitaxel can cause cardiac arrhythmia in 1%, tachycardia in 3%) - He was started on diltiazem drip at time of presentation with improvement in HR - Xarelto started, no signs of active bleeding - Continued on IVF hydration - Added metoprolol, weaned cardizem to off - Patient remained in RVR however on the evening of 09/06/18, patient elected to leave AMA while still tachycardic. Patient aware of risk of leaving  AKI, hyponatremia, hypomagnesemia - Renal function is acutely worse, BUN/Cr ratio is > 20, likely pre-renal - IVF with NS, bolus in the ED and then continued at 125cc/hr - EF is preserved based on TTE from 08/24/18 - received hydration and electrolytes corrected  Leukocytosis - Patient received dexamethasone 100mg  on 1/13, continued on medrol dose pack at time of admit -.No evidence of infection -remained stable  Stage III squamous cell carcinoma of left lung - He prefers to go to [facility] hospital so he can continue treatment - [facility].  - Continue home pain management regimen - PT consulted, recommendation for home health PT however pt left AMA  Chronic respiratory failure - continue home Oxygen 3L - Remained stable  DM (diabetes mellitus) - He has not started medications yet - On SSI in hospital  HTN (hypertension) - Blood pressure is running low at this time -Home BP meds on hold -Cardizem gtt weaned to off with metoprolol  Oral thrush - Started diflucan - remained stable   Discharge Instructions   Allergies as of 09/06/2018   No Known Allergies     Medication List  ASK your doctor about these medications   ALPRAZolam 0.5 MG tablet Commonly known as:  XANAX Take 1 tablet (0.5 mg total) by mouth 3 (three) times daily as needed for anxiety.   amLODipine 5 MG tablet Commonly known as:  NORVASC Take 1 tablet (5 mg total) by mouth daily.   fluconazole 100  MG tablet Commonly known as:  DIFLUCAN Take 1 tablet (100 mg total) by mouth daily.   glipiZIDE 5 MG tablet Commonly known as:  GLUCOTROL Take 5 mg by mouth daily before breakfast.   hydrochlorothiazide 25 MG tablet Commonly known as:  HYDRODIURIL Take 1 tablet (25 mg total) by mouth daily.   methylPREDNISolone 4 MG tablet Commonly known as:  MEDROL Take 1 tablet (4 mg total) by mouth daily. Taper 6,5,4,3,2,1   oxyCODONE-acetaminophen 7.5-325 MG tablet Commonly known as:  PERCOCET Take 1 tablet by mouth every 4 (four) hours as needed for severe pain.   potassium chloride SA 20 MEQ tablet Commonly known as:  K-DUR,KLOR-CON Take 1 tablet (20 mEq total) by mouth 2 (two) times daily.   prochlorperazine 10 MG tablet Commonly known as:  COMPAZINE Take 1 tablet (10 mg total) by mouth every 6 (six) hours as needed for nausea or vomiting.   sitaGLIPtin-metformin 50-1000 MG tablet Commonly known as:  JANUMET Take 1 tablet by mouth 2 (two) times daily with a meal.       No Known Allergies  Consultations:  Rad Onc  Procedures/Studies: Dg Chest 2 View  Result Date: 09/03/2018 CLINICAL DATA:  Irregular heartbeat and shortness of breath EXAM: CHEST - 2 VIEW COMPARISON:  08/24/2018 FINDINGS: Large pleural based density with central cavitation is again identified laterally similar to that seen on prior CT examination. No pneumothorax is seen. The right lung remains clear. No bony abnormality is noted. IMPRESSION: Stable mass in the left lung similar to that seen on the prior CT examination. No new acute abnormality is noted. Electronically Signed   By: Inez Catalina M.D.   On: 09/03/2018 11:16   Ct Angio Chest Pe W Or Wo Contrast  Result Date: 08/24/2018 CLINICAL DATA:  Evaluate for pulmonary embolus. Hemoptysis. Cough and SOB. EXAM: CT ANGIOGRAPHY CHEST WITH CONTRAST TECHNIQUE: Multidetector CT imaging of the chest was performed using the standard protocol during bolus administration of  intravenous contrast. Multiplanar CT image reconstructions and MIPs were obtained to evaluate the vascular anatomy. CONTRAST:  156mL ISOVUE-370 IOPAMIDOL (ISOVUE-370) INJECTION 76% COMPARISON:  PET-CT 08/05/2018 FINDINGS: Cardiovascular: There is mild cardiac enlargement. No pericardial effusion. Aortic atherosclerosis. Calcification in the LAD and RCA coronary artery identified. Main pulmonary artery appears patent. No saddle embolus. There is no lobar or segmental pulmonary artery filling defects identified. Mediastinum/Nodes: Normal appearance of the thyroid gland. The trachea appears patent and is midline. No axillary or supraclavicular lymph nodes. No mediastinal or right hilar adenopathy. Lungs/Pleura: Loculated left pleural effusion is again noted. On today's study this measures 5.1 by 12.2 cm. This is compared with 3.2 x 10.4 cm previously. The large necrotic left lower lobe lung mass with chest wall involvement is again identified. This measures 13.4 by 10.8 cm, image 56/4. On the previous exam this measured 14.2 x 13.0 cm. There has been progressive internal necrosis and cavitation when compared with the previous PET-CT. Significant atelectasis of the non involved portions of the medial left lower lobe noted. There are multiple new cavitary lung nodules identified throughout the left upper lobe compared with 08/05/2018. The largest measures 1.9 cm, image 42/6.  There is no right pleural effusion. Patchy areas of ground-glass attenuation are identified throughout the motion obscured right lung. Upper Abdomen: No acute abnormality. Musculoskeletal: There is extensive bony destruction involving the left lateral ribs secondary to chest wall involvement by left lung mass. Similar to previous exam. Review of the MIP images confirms the above findings. IMPRESSION: 1. No evidence for acute pulmonary embolus. 2. The again seen is a large cavitary left lung mass with extensive left lateral chest wall involvement.  Stable to mildly decreased in size in the interval, this mass exhibits progressive internal necrosis. 3. Interval development of numerous cavitary lung nodules within the left upper lobe. 4. Loculated pleural effusion within the left lower hemithorax has increased in size from previous exam. 5.  Aortic Atherosclerosis (ICD10-I70.0). 6. Coronary artery atherosclerotic calcifications. Electronically Signed   By: Kerby Moors M.D.   On: 08/24/2018 12:35   [place] Chest (pleural Effusion)  Result Date: 08/24/2018 CLINICAL DATA:  58 year old male with hypoxia. Evaluate for pleural effusion. EXAM: CHEST ULTRASOUND COMPARISON:  Chest x-ray 08/23/2018; prior CT scan of the chest 07/10/2018 FINDINGS: Sonographic evaluation of the left chest demonstrates a consolidated left lower lobe. There also appears to be some pleural fluid, however the fluid is extremely complex and similar in density to the consolidated lung. Thoracentesis was not performed. IMPRESSION: Consolidated left lower lung consistent with history of known chest wall mass. While there may be some pleural fluid, the fluid is difficult to separate from the left chest mass and therefore thoracentesis was deferred. Electronically Signed   By: Jacqulynn Cadet M.D.   On: 08/24/2018 11:08   Dg Chest Port 1 View  Result Date: 08/23/2018 CLINICAL DATA:  Short of breath. EXAM: PORTABLE CHEST 1 VIEW COMPARISON:  12020/04/418 FINDINGS: The heart size and mediastinal contours are within normal limits. Large left lower lobe lung mass with chest wall involvement is again noted and appears similar to previous exam. There is a left pleural effusion which appears increased in volume from previous study. Right lung appears clear. The visualized skeletal structures are unremarkable. IMPRESSION: 1. Interval increase in volume of left pleural effusion. 2. Similar appearance of large left lower lobe lung mass with chest wall involvement. Electronically Signed   By: Kerby Moors  M.D.   On: 08/23/2018 12:19      Discharge Exam: Vitals:   09/06/18 1500 09/06/18 1600  BP: 123/67 120/82  Pulse: (!) 113 (!) 127  Resp: 20 19  Temp:  97.8 F (36.6 C)  SpO2: 94% 95%   Vitals:   09/06/18 1300 09/06/18 1400 09/06/18 1500 09/06/18 1600  BP: 125/84 123/78 123/67 120/82  Pulse: (!) 106 (!) 121 (!) 113 (!) 127  Resp: 19 (!) 21 20 19   Temp:    97.8 F (36.6 C)  TempSrc:    Oral  SpO2: 100% 91% 94% 95%  Weight:      Height:         The results of significant diagnostics from this hospitalization (including imaging, microbiology, ancillary and laboratory) are listed below for reference.     Microbiology: Recent Results (from the past 240 hour(s))  MRSA PCR Screening     Status: None   Collection Time: 09/03/18  7:02 PM  Result Value Ref Range Status   MRSA by PCR NEGATIVE NEGATIVE Final    Comment:        The GeneXpert MRSA Assay (FDA approved for NASAL specimens only), is one component of a comprehensive MRSA colonization surveillance  program. It is not intended to diagnose MRSA infection nor to guide or monitor treatment for MRSA infections. Performed at [facility], [place] [address] [place], [place] [address]      Labs: BNP (last 3 results) Recent Labs    08/23/18 1133  BNP 681.1*   Basic Metabolic Panel: Recent Labs  Lab 09/03/18 1027 09/03/18 1123 09/04/18 0139 09/05/18 0454 09/06/18 0304 09/07/18 0954  NA 133*  --  133* 131* 133* 135  K 3.5  --  4.1 4.1 4.3 4.8  CL 90*  --  94* 96* 100 102  CO2 31  --  29 25 25 22   GLUCOSE 318*  --  272* 203* 170* 178*  BUN 32*  --  31* 27* 25* 21*  CREATININE 1.34*  --  1.10 0.91 0.88 0.86  CALCIUM 6.9*  --  6.2* 6.5* 6.6* 7.5*  MG  --  1.1* 1.2* 1.8 1.6*  --   PHOS  --  3.9  --   --   --   --    Liver Function Tests: Recent Labs  Lab 09/07/18 0954  AST 7*  ALT 11  ALKPHOS 169*  BILITOT 1.7*  PROT 5.8*  ALBUMIN 2.1*   No results for input(s): LIPASE, AMYLASE  in the last 168 hours. No results for input(s): AMMONIA in the last 168 hours. CBC: Recent Labs  Lab 09/03/18 1027 09/04/18 0139 09/07/18 0954  WBC 29.5* 29.6* 10.7*  NEUTROABS  --   --  9.9*  HGB 12.2* 10.1* 11.1*  HCT 38.4* 33.0* 36.8*  MCV 84.2 86.8 87.0  PLT PLATELET CLUMPS NOTED ON SMEAR, UNABLE TO ESTIMATE 97* 84*   Cardiac Enzymes: Recent Labs  Lab 09/03/18 1908 09/04/18 0139 09/04/18 1007  TROPONINI <0.03 <0.03 <0.03   BNP: Invalid input(s): POCBNP CBG: Recent Labs  Lab 09/05/18 1703 09/05/18 2111 09/06/18 0750 09/06/18 1239 09/06/18 1548  GLUCAP 162* 175* 135* 248* 236*   D-Dimer No results for input(s): DDIMER in the last 72 hours. Hgb A1c No results for input(s): HGBA1C in the last 72 hours. Lipid Profile No results for input(s): CHOL, HDL, LDLCALC, TRIG, CHOLHDL, LDLDIRECT in the last 72 hours. Thyroid function studies No results for input(s): TSH, T4TOTAL, T3FREE, THYROIDAB in the last 72 hours.  Invalid input(s): FREET3 Anemia work up No results for input(s): VITAMINB12, FOLATE, FERRITIN, TIBC, IRON, RETICCTPCT in the last 72 hours. Urinalysis    Component Value Date/Time   COLORURINE AMBER (A) 08/23/2018 1133   APPEARANCEUR HAZY (A) 08/23/2018 1133   LABSPEC 1.025 08/23/2018 1133   PHURINE 5.0 08/23/2018 1133   GLUCOSEU 150 (A) 08/23/2018 1133   HGBUR SMALL (A) 08/23/2018 1133   BILIRUBINUR NEGATIVE 08/23/2018 1133   KETONESUR 5 (A) 08/23/2018 1133   PROTEINUR >=300 (A) 08/23/2018 1133   NITRITE NEGATIVE 08/23/2018 1133   LEUKOCYTESUR NEGATIVE 08/23/2018 1133   Sepsis Labs Invalid input(s): PROCALCITONIN,  WBC,  LACTICIDVEN Microbiology Recent Results (from the past 240 hour(s))  MRSA PCR Screening     Status: None   Collection Time: 09/03/18  7:02 PM  Result Value Ref Range Status   MRSA by PCR NEGATIVE NEGATIVE Final    Comment:        The GeneXpert MRSA Assay (FDA approved for NASAL specimens only), is one component of  a comprehensive MRSA colonization surveillance program. It is not intended to diagnose MRSA infection nor to guide or monitor treatment for MRSA infections. Performed at [facility], [address] [place],  [address]      SIGNED:   Marylu Lund, MD  [facility] 09/07/2018, 6:08 PM  If 7PM-7AM, please contact night-coverage

## 2018-09-07 NOTE — Progress Notes (Signed)
58 year old male diagnosed with lung cancer receiving chemotherapy.  He is a patient of Dr. Julien Nordmann.  Past medical history includes ulcer, stroke, hypertension, fractured ribs, and diabetes.  Medications include Xanax, Glucotrol, Medrol, K. Dur, Compazine, and Janumet.  Labs include glucose 178, BUN 21, creatinine 0.86, and albumin 2.1.  Height: 5 feet 9 inches. Weight: 177.9 pounds on January 16. Usual body weight: 195-200 pounds. BMI: 26.14.  Noted patient's recent hospitalization.  At this time he was seen by a registered dietitian and diagnosed with moderate malnutrition. Patient admits to drastic weight loss of around 100 pounds however that was sometime ago was intentional. Currently patient admits to approximately 25 pound weight loss which was unintentional. Noted history of pitting edema and poor dentition.  Nutrition diagnosis: Unintended weight loss related to poor appetite and inadequate oral intake and diuresing as evidenced by 11% weight loss over 3 weeks.  Intervention: Educated patient to try to eat small amounts of food more often. Encouraged him to focus on high-protein, low sodium foods. Provided patient with fact sheets of ways to add calories and protein. Recommended weight maintenance of lean body mass. Encourage patient to drink Carnation breakfast essentials as tolerated.  Provided coupons.  Monitoring, evaluation, goals: Patient will tolerate adequate calories and protein to maintain lean body mass.  Next visit: Monday, February 3 during infusion.  **Disclaimer: This note was dictated with voice recognition software. Similar sounding words can inadvertently be transcribed and this note may contain transcription errors which may not have been corrected upon publication of note.**

## 2018-09-08 ENCOUNTER — Ambulatory Visit
Admission: RE | Admit: 2018-09-08 | Discharge: 2018-09-08 | Disposition: A | Discharge status: Home / Self Care | Payer: Self-pay | Source: Ambulatory Visit | Attending: Radiation Oncology | Admitting: Radiation Oncology

## 2018-09-09 ENCOUNTER — Other Ambulatory Visit: Payer: Self-pay | Admitting: Internal Medicine

## 2018-09-09 ENCOUNTER — Other Ambulatory Visit: Payer: Self-pay | Admitting: Medical Oncology

## 2018-09-09 ENCOUNTER — Ambulatory Visit
Admission: RE | Admit: 2018-09-09 | Discharge: 2018-09-09 | Disposition: A | Discharge status: Home / Self Care | Payer: Self-pay | Source: Ambulatory Visit | Attending: Radiation Oncology | Admitting: Radiation Oncology

## 2018-09-09 DIAGNOSIS — Z5111 Encounter for antineoplastic chemotherapy: Secondary | ICD-10-CM

## 2018-09-09 MED ORDER — PROCHLORPERAZINE MALEATE 10 MG PO TABS: 10.0000 mg | ORAL_TABLET | Freq: Four times a day (QID) | ORAL | 0 refills | Status: AC | PRN

## 2018-09-10 ENCOUNTER — Telehealth: Payer: Self-pay | Admitting: *Deleted

## 2018-09-10 ENCOUNTER — Ambulatory Visit
Admission: RE | Admit: 2018-09-10 | Discharge: 2018-09-10 | Disposition: A | Discharge status: Home / Self Care | Payer: Self-pay | Source: Ambulatory Visit | Attending: Radiation Oncology | Admitting: Radiation Oncology

## 2018-09-10 NOTE — Telephone Encounter (Signed)
TCT patient's home. Spoke with pt's brother (?), [name]. [name] states [place] is feeling better.  We had received fax message from [facility] as [name] had called on call # last night. Pt had experienced some nausea/vomiting. Pt had treatment with Taxol/carbo on 09/07/18.  He was also given prednisone dospak for appetite. Pt felt like prednisone was giving him sweats/anxiety.  This med was stopped per on call MD.  No further nausea/vomiting.  Drinking fluids well. He did have 1 episode of diarrhea but no more today.  [name] understands to call with any further questions or concerns.

## 2018-09-11 ENCOUNTER — Ambulatory Visit
Admission: RE | Admit: 2018-09-11 | Discharge: 2018-09-11 | Disposition: A | Discharge status: Home / Self Care | Payer: Self-pay | Source: Ambulatory Visit | Attending: Radiation Oncology | Admitting: Radiation Oncology

## 2018-09-14 ENCOUNTER — Ambulatory Visit: Admission: RE | Admit: 2018-09-14 | Payer: Self-pay | Source: Ambulatory Visit

## 2018-09-14 ENCOUNTER — Emergency Department (HOSPITAL_COMMUNITY): Payer: Self-pay

## 2018-09-14 ENCOUNTER — Encounter (HOSPITAL_COMMUNITY): Payer: Self-pay

## 2018-09-14 ENCOUNTER — Other Ambulatory Visit: Payer: Self-pay

## 2018-09-14 ENCOUNTER — Inpatient Hospital Stay: Payer: Self-pay

## 2018-09-14 ENCOUNTER — Inpatient Hospital Stay: Payer: Self-pay | Admitting: Medical

## 2018-09-14 ENCOUNTER — Inpatient Hospital Stay (HOSPITAL_COMMUNITY)
Admission: EM | Admit: 2018-09-14 | Discharge: 2018-09-19 | DRG: 871 | Disposition: E | Payer: Self-pay | Attending: Emergency Medicine | Admitting: Emergency Medicine

## 2018-09-14 DIAGNOSIS — J150 Pneumonia due to Klebsiella pneumoniae: Secondary | ICD-10-CM | POA: Diagnosis present

## 2018-09-14 DIAGNOSIS — Z01818 Encounter for other preprocedural examination: Secondary | ICD-10-CM

## 2018-09-14 DIAGNOSIS — I4819 Other persistent atrial fibrillation: Secondary | ICD-10-CM | POA: Diagnosis present

## 2018-09-14 DIAGNOSIS — I10 Essential (primary) hypertension: Secondary | ICD-10-CM | POA: Diagnosis present

## 2018-09-14 DIAGNOSIS — Z6828 Body mass index (BMI) 28.0-28.9, adult: Secondary | ICD-10-CM

## 2018-09-14 DIAGNOSIS — F17210 Nicotine dependence, cigarettes, uncomplicated: Secondary | ICD-10-CM | POA: Diagnosis present

## 2018-09-14 DIAGNOSIS — R627 Adult failure to thrive: Secondary | ICD-10-CM | POA: Diagnosis present

## 2018-09-14 DIAGNOSIS — Z9981 Dependence on supplemental oxygen: Secondary | ICD-10-CM

## 2018-09-14 DIAGNOSIS — E785 Hyperlipidemia, unspecified: Secondary | ICD-10-CM | POA: Diagnosis present

## 2018-09-14 DIAGNOSIS — Z9911 Dependence on respirator [ventilator] status: Secondary | ICD-10-CM

## 2018-09-14 DIAGNOSIS — R000 Tachycardia, unspecified: Secondary | ICD-10-CM | POA: Diagnosis not present

## 2018-09-14 DIAGNOSIS — E43 Unspecified severe protein-calorie malnutrition: Secondary | ICD-10-CM | POA: Diagnosis present

## 2018-09-14 DIAGNOSIS — C3490 Malignant neoplasm of unspecified part of unspecified bronchus or lung: Secondary | ICD-10-CM

## 2018-09-14 DIAGNOSIS — R64 Cachexia: Secondary | ICD-10-CM | POA: Diagnosis present

## 2018-09-14 DIAGNOSIS — Z4659 Encounter for fitting and adjustment of other gastrointestinal appliance and device: Secondary | ICD-10-CM

## 2018-09-14 DIAGNOSIS — Z888 Allergy status to other drugs, medicaments and biological substances status: Secondary | ICD-10-CM

## 2018-09-14 DIAGNOSIS — J9600 Acute respiratory failure, unspecified whether with hypoxia or hypercapnia: Secondary | ICD-10-CM

## 2018-09-14 DIAGNOSIS — Z7984 Long term (current) use of oral hypoglycemic drugs: Secondary | ICD-10-CM

## 2018-09-14 DIAGNOSIS — Z452 Encounter for adjustment and management of vascular access device: Secondary | ICD-10-CM

## 2018-09-14 DIAGNOSIS — Z8711 Personal history of peptic ulcer disease: Secondary | ICD-10-CM

## 2018-09-14 DIAGNOSIS — Y95 Nosocomial condition: Secondary | ICD-10-CM | POA: Diagnosis present

## 2018-09-14 DIAGNOSIS — Z79899 Other long term (current) drug therapy: Secondary | ICD-10-CM

## 2018-09-14 DIAGNOSIS — Z66 Do not resuscitate: Secondary | ICD-10-CM | POA: Diagnosis present

## 2018-09-14 DIAGNOSIS — I469 Cardiac arrest, cause unspecified: Secondary | ICD-10-CM

## 2018-09-14 DIAGNOSIS — E872 Acidosis: Secondary | ICD-10-CM | POA: Diagnosis present

## 2018-09-14 DIAGNOSIS — G9341 Metabolic encephalopathy: Secondary | ICD-10-CM | POA: Diagnosis present

## 2018-09-14 DIAGNOSIS — N179 Acute kidney failure, unspecified: Secondary | ICD-10-CM

## 2018-09-14 DIAGNOSIS — Z825 Family history of asthma and other chronic lower respiratory diseases: Secondary | ICD-10-CM

## 2018-09-14 DIAGNOSIS — A419 Sepsis, unspecified organism: Principal | ICD-10-CM | POA: Diagnosis present

## 2018-09-14 DIAGNOSIS — Z8673 Personal history of transient ischemic attack (TIA), and cerebral infarction without residual deficits: Secondary | ICD-10-CM

## 2018-09-14 DIAGNOSIS — J189 Pneumonia, unspecified organism: Secondary | ICD-10-CM | POA: Diagnosis present

## 2018-09-14 DIAGNOSIS — R6521 Severe sepsis with septic shock: Secondary | ICD-10-CM | POA: Diagnosis present

## 2018-09-14 DIAGNOSIS — J9621 Acute and chronic respiratory failure with hypoxia: Secondary | ICD-10-CM | POA: Diagnosis present

## 2018-09-14 DIAGNOSIS — E119 Type 2 diabetes mellitus without complications: Secondary | ICD-10-CM

## 2018-09-14 DIAGNOSIS — E875 Hyperkalemia: Secondary | ICD-10-CM | POA: Diagnosis present

## 2018-09-14 DIAGNOSIS — L89152 Pressure ulcer of sacral region, stage 2: Secondary | ICD-10-CM | POA: Diagnosis present

## 2018-09-14 DIAGNOSIS — C3482 Malignant neoplasm of overlapping sites of left bronchus and lung: Secondary | ICD-10-CM

## 2018-09-14 DIAGNOSIS — C3492 Malignant neoplasm of unspecified part of left bronchus or lung: Secondary | ICD-10-CM | POA: Diagnosis present

## 2018-09-14 DIAGNOSIS — Z515 Encounter for palliative care: Secondary | ICD-10-CM | POA: Diagnosis present

## 2018-09-14 DIAGNOSIS — Q211 Atrial septal defect: Secondary | ICD-10-CM

## 2018-09-14 DIAGNOSIS — J440 Chronic obstructive pulmonary disease with acute lower respiratory infection: Secondary | ICD-10-CM | POA: Diagnosis present

## 2018-09-14 DIAGNOSIS — E440 Moderate protein-calorie malnutrition: Secondary | ICD-10-CM | POA: Diagnosis present

## 2018-09-14 DIAGNOSIS — E871 Hypo-osmolality and hyponatremia: Secondary | ICD-10-CM | POA: Diagnosis present

## 2018-09-14 DIAGNOSIS — J90 Pleural effusion, not elsewhere classified: Secondary | ICD-10-CM | POA: Diagnosis present

## 2018-09-14 DIAGNOSIS — J9690 Respiratory failure, unspecified, unspecified whether with hypoxia or hypercapnia: Secondary | ICD-10-CM

## 2018-09-14 DIAGNOSIS — D61810 Antineoplastic chemotherapy induced pancytopenia: Secondary | ICD-10-CM | POA: Diagnosis present

## 2018-09-14 DIAGNOSIS — I4891 Unspecified atrial fibrillation: Secondary | ICD-10-CM

## 2018-09-14 DIAGNOSIS — E1165 Type 2 diabetes mellitus with hyperglycemia: Secondary | ICD-10-CM | POA: Diagnosis present

## 2018-09-14 DIAGNOSIS — T451X5A Adverse effect of antineoplastic and immunosuppressive drugs, initial encounter: Secondary | ICD-10-CM | POA: Diagnosis present

## 2018-09-14 DIAGNOSIS — R0603 Acute respiratory distress: Secondary | ICD-10-CM

## 2018-09-14 LAB — CBC WITH DIFFERENTIAL/PLATELET
Abs Immature Granulocytes: 0.04 10*3/uL (ref 0.00–0.07)
Basophils Absolute: 0 10*3/uL (ref 0.0–0.1)
Basophils Relative: 0 %
Eosinophils Absolute: 0 10*3/uL (ref 0.0–0.5)
Eosinophils Relative: 3 %
HCT: 27.9 % — ABNORMAL LOW (ref 39.0–52.0)
Hemoglobin: 8.8 g/dL — ABNORMAL LOW (ref 13.0–17.0)
Immature Granulocytes: 10 %
Lymphocytes Relative: 15 %
Lymphs Abs: 0.1 10*3/uL — ABNORMAL LOW (ref 0.7–4.0)
MCH: 27 pg (ref 26.0–34.0)
MCHC: 31.5 g/dL (ref 30.0–36.0)
MCV: 85.6 fL (ref 80.0–100.0)
Monocytes Absolute: 0 10*3/uL — ABNORMAL LOW (ref 0.1–1.0)
Monocytes Relative: 10 %
Neutro Abs: 0.3 10*3/uL — ABNORMAL LOW (ref 1.7–7.7)
Neutrophils Relative %: 62 %
PLATELETS: 61 10*3/uL — AB (ref 150–400)
RBC: 3.26 MIL/uL — AB (ref 4.22–5.81)
RDW: 14.9 % (ref 11.5–15.5)
WBC: 0.4 10*3/uL — CL (ref 4.0–10.5)
nRBC: 0 % (ref 0.0–0.2)

## 2018-09-14 LAB — HEPATIC FUNCTION PANEL
ALT: 19 U/L (ref 0–44)
ALT: 17 U/L (ref 0–44)
AST: 17 U/L (ref 15–41)
AST: 10 U/L — ABNORMAL LOW (ref 15–41)
Albumin: 2.4 g/dL — ABNORMAL LOW (ref 3.5–5.0)
Albumin: 1.9 g/dL — ABNORMAL LOW (ref 3.5–5.0)
Alkaline Phosphatase: 147 U/L — ABNORMAL HIGH (ref 38–126)
Alkaline Phosphatase: 120 U/L (ref 38–126)
BILIRUBIN DIRECT: 0.6 mg/dL — AB (ref 0.0–0.2)
Bilirubin, Direct: 0.6 mg/dL — ABNORMAL HIGH (ref 0.0–0.2)
Indirect Bilirubin: 1.4 mg/dL — ABNORMAL HIGH (ref 0.3–0.9)
Indirect Bilirubin: 1.1 mg/dL — ABNORMAL HIGH (ref 0.3–0.9)
Total Bilirubin: 2 mg/dL — ABNORMAL HIGH (ref 0.3–1.2)
Total Bilirubin: 1.7 mg/dL — ABNORMAL HIGH (ref 0.3–1.2)
Total Protein: 5.9 g/dL — ABNORMAL LOW (ref 6.5–8.1)
Total Protein: 5 g/dL — ABNORMAL LOW (ref 6.5–8.1)

## 2018-09-14 LAB — CBC
HCT: 28 % — ABNORMAL LOW (ref 39.0–52.0)
Hemoglobin: 8.6 g/dL — ABNORMAL LOW (ref 13.0–17.0)
MCH: 26 pg (ref 26.0–34.0)
MCHC: 30.7 g/dL (ref 30.0–36.0)
MCV: 84.6 fL (ref 80.0–100.0)
Platelets: 62 10*3/uL — ABNORMAL LOW (ref 150–400)
RBC: 3.31 MIL/uL — ABNORMAL LOW (ref 4.22–5.81)
RDW: 15.1 % (ref 11.5–15.5)
WBC: 0.3 10*3/uL — CL (ref 4.0–10.5)
nRBC: 0 % (ref 0.0–0.2)

## 2018-09-14 LAB — BASIC METABOLIC PANEL
ANION GAP: 12 (ref 5–15)
BUN: 52 mg/dL — ABNORMAL HIGH (ref 6–20)
CO2: 23 mmol/L (ref 22–32)
Calcium: 7.5 mg/dL — ABNORMAL LOW (ref 8.9–10.3)
Chloride: 95 mmol/L — ABNORMAL LOW (ref 98–111)
Creatinine, Ser: 1.5 mg/dL — ABNORMAL HIGH (ref 0.61–1.24)
GFR calc Af Amer: 58 mL/min — ABNORMAL LOW (ref >60)
GFR calc non Af Amer: 50 mL/min — ABNORMAL LOW (ref >60)
Glucose, Bld: 432 mg/dL — ABNORMAL HIGH (ref 70–99)
Potassium: 5.6 mmol/L — ABNORMAL HIGH (ref 3.5–5.1)
Sodium: 130 mmol/L — ABNORMAL LOW (ref 135–145)

## 2018-09-14 LAB — PROTIME-INR
INR: 1.15
Prothrombin Time: 14.6 seconds (ref 11.4–15.2)

## 2018-09-14 LAB — I-STAT TROPONIN, ED: Troponin i, poc: 0.01 ng/mL (ref 0.00–0.08)

## 2018-09-14 LAB — GLUCOSE, CAPILLARY: GLUCOSE-CAPILLARY: 368 mg/dL — AB (ref 70–99)

## 2018-09-14 LAB — LACTIC ACID, PLASMA
Lactic Acid, Venous: 2.3 mmol/L (ref 0.5–1.9)
Lactic Acid, Venous: 2.4 mmol/L (ref 0.5–1.9)
Lactic Acid, Venous: 1.7 mmol/L (ref 0.5–1.9)

## 2018-09-14 LAB — PHOSPHORUS: Phosphorus: 3.4 mg/dL (ref 2.5–4.6)

## 2018-09-14 LAB — CBG MONITORING, ED: Glucose-Capillary: 376 mg/dL — ABNORMAL HIGH (ref 70–99)

## 2018-09-14 LAB — MAGNESIUM: MAGNESIUM: 1.4 mg/dL — AB (ref 1.7–2.4)

## 2018-09-14 LAB — TSH: TSH: 1.899 u[IU]/mL (ref 0.350–4.500)

## 2018-09-14 LAB — POTASSIUM: Potassium: 5.3 mmol/L — ABNORMAL HIGH (ref 3.5–5.1)

## 2018-09-14 MED ORDER — MAGNESIUM SULFATE 4 GM/100ML IV SOLN
4.0000 g | Freq: Once | INTRAVENOUS | Status: AC
  Administered 2018-09-14: 4 g via INTRAVENOUS
  Filled 2018-09-14: qty 100

## 2018-09-14 MED ORDER — ALBUMIN HUMAN 25 % IV SOLN
INTRAVENOUS | Status: AC
  Filled 2018-09-14: qty 100

## 2018-09-14 MED ORDER — ENOXAPARIN SODIUM 40 MG/0.4ML ~~LOC~~ SOLN: 40.0000 mg | SUBCUTANEOUS | Status: DC

## 2018-09-14 MED ORDER — ALPRAZOLAM 0.5 MG PO TABS
0.5000 mg | ORAL_TABLET | Freq: Three times a day (TID) | ORAL | Status: DC | PRN
  Filled 2018-09-14: qty 1

## 2018-09-14 MED ORDER — DILTIAZEM HCL-DEXTROSE 100-5 MG/100ML-% IV SOLN (PREMIX)
5.0000 mg/h | INTRAVENOUS | Status: DC
  Administered 2018-09-14 (×2): 5 mg/h via INTRAVENOUS
  Filled 2018-09-14 (×4): qty 100

## 2018-09-14 MED ORDER — SODIUM CHLORIDE 0.9 % IV SOLN
1000.0000 mL | INTRAVENOUS | Status: DC
  Administered 2018-09-14: 1000 mL via INTRAVENOUS

## 2018-09-14 MED ORDER — SODIUM CHLORIDE 0.9 % IV SOLN
1000.0000 mL | INTRAVENOUS | Status: DC
  Administered 2018-09-15: 1000 mL via INTRAVENOUS

## 2018-09-14 MED ORDER — ALBUMIN HUMAN 25 % IV SOLN
50.0000 g | Freq: Once | INTRAVENOUS | Status: AC
  Administered 2018-09-14: 50 g via INTRAVENOUS
  Filled 2018-09-14: qty 50

## 2018-09-14 MED ORDER — SODIUM CHLORIDE 0.9 % IV BOLUS
500.0000 mL | Freq: Once | INTRAVENOUS | Status: AC
  Administered 2018-09-14: 500 mL via INTRAVENOUS

## 2018-09-14 MED ORDER — AMIODARONE HCL IN DEXTROSE 360-4.14 MG/200ML-% IV SOLN
60.0000 mg/h | INTRAVENOUS | Status: AC
  Administered 2018-09-14 (×2): 60 mg/h via INTRAVENOUS
  Filled 2018-09-14 (×2): qty 200

## 2018-09-14 MED ORDER — ALBUTEROL SULFATE (2.5 MG/3ML) 0.083% IN NEBU
5.0000 mg | INHALATION_SOLUTION | Freq: Once | RESPIRATORY_TRACT | Status: AC
  Administered 2018-09-14: 5 mg via RESPIRATORY_TRACT
  Filled 2018-09-14: qty 6

## 2018-09-14 MED ORDER — PROCHLORPERAZINE MALEATE 10 MG PO TABS
10.0000 mg | ORAL_TABLET | Freq: Two times a day (BID) | ORAL | Status: DC | PRN
  Filled 2018-09-14: qty 1

## 2018-09-14 MED ORDER — SODIUM CHLORIDE 0.9% FLUSH
3.0000 mL | Freq: Two times a day (BID) | INTRAVENOUS | Status: DC
  Administered 2018-09-15 (×2): 3 mL via INTRAVENOUS

## 2018-09-14 MED ORDER — AMIODARONE LOAD VIA INFUSION
150.0000 mg | Freq: Once | INTRAVENOUS | Status: AC
  Administered 2018-09-14: 150 mg via INTRAVENOUS
  Filled 2018-09-14: qty 83.34

## 2018-09-14 MED ORDER — DILTIAZEM HCL 25 MG/5ML IV SOLN
10.0000 mg | Freq: Once | INTRAVENOUS | Status: AC
  Administered 2018-09-14: 10 mg via INTRAVENOUS
  Filled 2018-09-14: qty 5

## 2018-09-14 MED ORDER — MENTHOL 5.8 MG MT LOZG: 1.0000 | LOZENGE | Freq: Every day | OROMUCOSAL | Status: DC | PRN

## 2018-09-14 MED ORDER — DIGOXIN 0.25 MG/ML IJ SOLN
0.1250 mg | Freq: Once | INTRAMUSCULAR | Status: AC | PRN
  Administered 2018-09-15: 0.125 mg via INTRAVENOUS
  Filled 2018-09-14: qty 0.5

## 2018-09-14 MED ORDER — RIVAROXABAN 20 MG PO TABS
20.0000 mg | ORAL_TABLET | Freq: Every day | ORAL | Status: DC
  Administered 2018-09-15: 20 mg via ORAL
  Filled 2018-09-14: qty 1

## 2018-09-14 MED ORDER — INSULIN ASPART 100 UNIT/ML ~~LOC~~ SOLN: 0.0000 [IU] | Freq: Three times a day (TID) | SUBCUTANEOUS | Status: DC

## 2018-09-14 MED ORDER — INSULIN ASPART 100 UNIT/ML ~~LOC~~ SOLN
0.0000 [IU] | Freq: Every day | SUBCUTANEOUS | Status: DC
  Administered 2018-09-14: 5 [IU] via SUBCUTANEOUS

## 2018-09-14 MED ORDER — SODIUM CHLORIDE 0.9 % IV BOLUS
2000.0000 mL | Freq: Once | INTRAVENOUS | Status: AC
  Administered 2018-09-14: 1000 mL via INTRAVENOUS

## 2018-09-14 MED ORDER — SODIUM CHLORIDE 0.9 % IV BOLUS (SEPSIS)
500.0000 mL | Freq: Once | INTRAVENOUS | Status: AC
  Administered 2018-09-14: 500 mL via INTRAVENOUS

## 2018-09-14 MED ORDER — FLUCONAZOLE 100 MG PO TABS: 100.0000 mg | ORAL_TABLET | Freq: Every day | ORAL | Status: DC

## 2018-09-14 MED ORDER — AMIODARONE HCL IN DEXTROSE 360-4.14 MG/200ML-% IV SOLN
30.0000 mg/h | INTRAVENOUS | Status: DC
  Administered 2018-09-14: 30 mg/h via INTRAVENOUS

## 2018-09-14 MED ORDER — OXYCODONE-ACETAMINOPHEN 7.5-325 MG PO TABS
1.0000 | ORAL_TABLET | ORAL | Status: DC | PRN
  Filled 2018-09-14: qty 1

## 2018-09-14 MED ORDER — DILTIAZEM LOAD VIA INFUSION
10.0000 mg | Freq: Once | INTRAVENOUS | Status: AC
  Administered 2018-09-14: 10 mg via INTRAVENOUS
  Filled 2018-09-14: qty 10

## 2018-09-14 MED ORDER — AMIODARONE IV BOLUS ONLY 150 MG/100ML
150.0000 mg | Freq: Once | INTRAVENOUS | Status: AC
  Administered 2018-09-14: 150 mg via INTRAVENOUS
  Filled 2018-09-14: qty 100

## 2018-09-14 MED ORDER — MORPHINE SULFATE (PF) 4 MG/ML IV SOLN
4.0000 mg | Freq: Once | INTRAVENOUS | Status: AC
  Administered 2018-09-14: 4 mg via INTRAVENOUS
  Filled 2018-09-14: qty 1

## 2018-09-14 MED ORDER — MENTHOL 3 MG MT LOZG: 1.0000 | LOZENGE | OROMUCOSAL | Status: DC | PRN

## 2018-09-14 NOTE — Progress Notes (Addendum)
Shift event: NP paged by RN earlier due to high LA which apparently wasn't treated on day shift. No UO per RN since ? when. NP ordered Mg 4 gms IV once and a NS bolus.  Later, RN paged NP due to pt's increased O2 requirement, BP in the 80s, and HR still over 120 on Cardizem and Amiodarone. RN stopped Cardizem. NP to bedside. S: Denies CP, palpitations, dizziness. SOB only when talking. RN increased ventimask to 55% when O2 sat dropped to 90 while sleeping.  O: Poor appearing, acute on chronically ill WM in NAD. Afebrile. BP 80-85. HR 120s. O2 sat 93% on venti. Falls to 90 when tested off mask. He is sleeping, but arouses easily to name calling. When awake he is joking around and oriented. Card: irreg irreg. No JVD. No MRG. Lungs: slight expiratory wheezing on the right with good air exchange. Little to no air exchange on the left (mass). Abd: soft, NT. Extremities: 2+ pitting edema to LEs. Slight edema to UEs, but no redness or tenderness. Neuro: no focal deficits noted.  A/P: 1. Increased O2 requirement with acute on chronic respiratory failure. Considered ABG, but he is oriented when awake and is having no increased work of breathing. Is likely due to his left lung cancer. Continue VM and try to wean if possible. Wears O2 at home. He is a smoker. Will follow.  2. Hypotension-due to cardizem and SIRS ? sepsis. Cardizem off. Will use one dose of Digoxin prn HR over 120 if sustained. Continue Amiodarone. May need to call cardiology tonight for options if RVR continues and BP is low. He is not symptomatic. CXR and urine neg for infection. Afebrile. LA was elevated and gave a 1L bolus (echo recently showed no CHF except for LVH). After seeing pt's edema, will give albumin. His albumin is low on labs. Repeat LA normal. Will place a foley and monitor his output. Perhaps he urinated in ED and it just wasn't charted. Strict I&O.  3. Left lung cancer-per [facility]. Had radiation tx and chemo in Dec 2019. This is certainly  affecting his O2 requirement.  4. Afib with RVR-left AMA last hospitalization and didn't get a Rx for Metoprolol or Xarelto. Anticoagulation restarted here.Will need some rate control med on discharge.  5. Hypomagnesia-repleted. May be contributing to AFib with RVR. Recheck level in am.  6. AKI-improving. Decrease IVF to [facility] due to edema. Judicious fluids in am if creat rising. Monitor urine output.  Update: RN paged again because pt's BP fell into the 60s. RN stopped amiodarone. Cardizem stopped prior. NP back to bedside. BP up to 80 when NP arrived. Pt receiving last of 50gm Albumin. BP up to 94. HR holding in the 120s, sometimes in the 1 teens. Difficult to treat due to soft BP. O2 sat fluctuates, but could be probe. Pt placed on partial NRB by RT and O2 sat up to 98%. He remains alert, oriented. Foley placed with large amount of urine returned. R/p albumin 50 and monitor closely.  Update: Came back to see pt due to soft BP. HR still AF with RVR in the 1 teens to 120s. NP spoke to cardio fellow on call to discuss HR with hypotension. Cardiology suggested continue research for infection. Stated to restart Amio and d/c' Cardizem. OK for HR to be in 1 teen to 120s now. Pt remains easily arousable but he is more sleepy. Called PCCM for consult and discussed case. Agree with above. Pt immunocompromised, so abx added and  lab work ordered. Peripheral Neo started until central line is placed.  Shortly thereafter, pt became more lethargic with increased WOB. PCCM camera'd in and agree that he is working harder to breathe as well. Also, Dr. Oletta Darter reviewed CXR which looks "pretty bad". Due to WOB and declining mental status, pt in need of intubation. Spoke to pt about resuscitation and he wants to be a full code and is OK with intubation. Anesthesia called to intubate as PCCM is tied up at [facility]. PCCM came to bedside and will take over care as attendings.   Labs pending-CXR and ABG after intubation. CBC with diff.  CMP. Procalcitonin, LA, blood cx x2.  Rest of care per PCCM  Total critical care time: Start: 2200, end 2255 total 55 minutes + start 2350 end 0025 for additional 35 mins. Additional time: 0100 to 0200 for 60 minutes. Now total of 150 mins.  Critical care time was exclusive of separately billable procedures and treating other patients. Critical care was necessary to treat or prevent imminent or life-threatening deterioration. Critical care was time spent personally by me on the following activities: development of treatment plan with patient and/or surrogate as well as nursing, discussions with consultants, evaluation of patient's response to treatment, examination of patient, obtaining history from patient or surrogate, ordering and performing treatments and interventions, ordering and review of laboratory studies, ordering and review of radiographic studies, pulse oximetry and re-evaluation of patient's condition. KJKG, NP [facility]

## 2018-09-14 NOTE — ED Notes (Signed)
Patient aware we need a urine sample. Patient will call out once sample is provided. Urinal at bedside.

## 2018-09-14 NOTE — ED Notes (Signed)
Date and time results received: 09/13/2018 12:33 PM  (use smartphrase ".now" to insert current time)  Test: lactic acid Critical Value: 2.3  Name of Provider Notified: logan RN  Orders Received? Or Actions Taken?:

## 2018-09-14 NOTE — ED Notes (Signed)
Cleaned and changed patient. Smear BM. Wound consult ordered. For coccyx wound.

## 2018-09-14 NOTE — ED Notes (Signed)
ED Provider at bedside. PFEIFFER

## 2018-09-14 NOTE — ED Provider Notes (Signed)
[facility] DEPT Provider Note   CSN: [id number] Arrival date & time: 09/13/2018  1011     History   Chief Complaint Chief Complaint  Patient presents with  . Shortness of Breath  . Emesis    HPI Dylan Jacobs is a 58 y.o. male.  HPI Patient with known history of metastatic lung cancer and atrial fibrillation.  Patient had been hospitalized for atrial fibrillation with rapid ventricular response.  He left AMA on 1\19 because he reports he been there for 4 days and they never really got control over his heart rate and he could not afford his to stay in the hospital if nothing was working.  He has been basically short of breath since that time.  Symptoms however have been progressively worsening and he feels very short of breath now.  He reports he feels very generally weak.  He states he has pain in his buttocks and left chest where his cancer is.  He has not developed fever.  He has not had worsening cough.  He reports his legs have been chronically swollen and not much worse than they have been over the past several weeks.  He reports they weep and ooze.  He was going into the cancer center today to get his chemo and radiation treatment.  He was redirected to the emergency department. Past Medical History:  Diagnosis Date  . Cancer (HCC)    Rib  . Diabetes mellitus without complication ([place])   . Fractured rib   . Hypertension   . Stroke ([place])   . Ulcer     Patient Active Problem List   Diagnosis Date Noted  . Atrial fibrillation with RVR ([place]) 09/03/2018  . Acute CHF (congestive heart failure) ([place]) 08/23/2018  . Pleural effusion 08/23/2018  . Community acquired pneumonia 08/23/2018  . Acute on chronic respiratory failure with hypoxia ([place]) 08/23/2018  . Goals of care, counseling/discussion 08/01/2018  . Encounter for antineoplastic chemotherapy 08/01/2018  . Cancer associated pain 08/01/2018  . Stage III squamous cell carcinoma of left lung  ([place]) 07/31/2018  . Bronchogenic lung cancer, left ([place]) 07/11/2018  . Lung nodule 05/30/2014  . DM (diabetes mellitus) ([place]) 12/29/2011  . HTN (hypertension) 12/29/2011  . Hyperlipidemia 12/29/2011    Past Surgical History:  Procedure Laterality Date  . NO PAST SURGERIES          Home Medications    Prior to Admission medications   Medication Sig Start Date End Date Taking? Authorizing Provider  ALPRAZolam Duanne Moron) 0.5 MG tablet Take 1 tablet (0.5 mg total) by mouth 3 (three) times daily as needed for anxiety. 08/31/18  Yes Causey, Charlestine Massed, NP  amLODipine (NORVASC) 5 MG tablet Take 1 tablet (5 mg total) by mouth daily. 08/26/18  Yes Purohit, Konrad Dolores, MD  fluconazole (DIFLUCAN) 100 MG tablet Take 1 tablet (100 mg total) by mouth daily. 09/02/18  Yes Gery Pray, MD  hydrochlorothiazide (HYDRODIURIL) 25 MG tablet Take 1 tablet (25 mg total) by mouth daily. 08/26/18  Yes Purohit, Konrad Dolores, MD  Menthol (HALLS COUGH DROPS) 5.8 MG LOZG Use as directed 1 lozenge in the mouth or throat daily as needed (cough).   Yes [provider]  Menthol (RICOLA) LOZG Use as directed 1 lozenge in the mouth or throat daily as needed (cough).   Yes [provider]  oxyCODONE-acetaminophen (PERCOCET) 7.5-325 MG tablet Take 1 tablet by mouth every 4 (four) hours as needed for severe pain. 08/22/18  Yes  Gery Pray, MD  potassium chloride SA (K-DUR,KLOR-CON) 20 MEQ tablet Take 1 tablet (20 mEq total) by mouth 2 (two) times daily. Patient taking differently: Take 40 mEq by mouth daily.  08/31/18  Yes Causey, Charlestine Massed, NP  prochlorperazine (COMPAZINE) 10 MG tablet TAKE 1 TABLET BY MOUTH EVERY 6 HOURS AS NEEDED FOR NAUSEA OR VOMITING Patient taking differently: Take 10 mg by mouth 2 (two) times daily as needed for nausea or vomiting.  09/09/18  Yes Curt Bears, MD  sitaGLIPtin-metformin (JANUMET) 50-1000 MG tablet Take 1 tablet by mouth 2 (two) times daily with a meal. 08/25/18 09/24/18  Yes Purohit, Konrad Dolores, MD  methylPREDNISolone (MEDROL) 4 MG tablet Take 1 tablet (4 mg total) by mouth daily. Taper 6,5,4,3,2,1 Patient not taking: Reported on 09/07/2018 08/31/18   Gardenia Phlegm, NP  prochlorperazine (COMPAZINE) 10 MG tablet Take 1 tablet (10 mg total) by mouth every 6 (six) hours as needed for nausea or vomiting. Patient not taking: Reported on 09/06/2018 09/09/18   Curt Bears, MD    Family History Family History  Problem Relation Age of Onset  . Prostate cancer Father   . Cancer Brother   . ALS Brother   . COPD Brother     Social History Social History   Tobacco Use  . Smoking status: Current Every Day Smoker    Packs/day: 2.00    Years: 30.00    Pack years: 60.00    Types: Cigarettes    Last attempt to quit: 12/31/2009    Years since quitting: 8.7  . Smokeless tobacco: Never Used  . Tobacco comment: smoking 4 cigarettes per day  Substance Use Topics  . Alcohol use: Yes  . Drug use: Yes    Types: Marijuana     Allergies   Solu-medrol [methylprednisolone]   Review of Systems Review of Systems 10 Systems reviewed and are negative for acute change except as noted in the HPI.   Physical Exam Updated Vital Signs BP 90/72   Pulse (!) 102   Temp (S) 99.7 F (37.6 C) (Rectal)   Resp (!) 23   Ht 5\' 9"  (1.753 m)   Wt 90 kg   SpO2 92%   BMI 29.30 kg/m   Physical Exam Constitutional:      Comments: Patient is alert with clear mental status.  He is very deconditioned and pale in appearance.  Mild to moderate increased work of breathing.  HENT:     Head: Normocephalic and atraumatic.     Mouth/Throat:     Mouth: Mucous membranes are moist.  Eyes:     Extraocular Movements: Extraocular movements intact.  Cardiovascular:     Comments: Extreme tachycardia, irregularly irregular.  Cannot appreciate rub or gallop or murmur at monitor rate of 170s.  Radial pulses 2+ and intact. Pulmonary:     Comments: Mild to moderate increased work of  breathing.  Patient has good airflow on the right.  Diminished air for the left base. Abdominal:     General: There is no distension.     Palpations: Abdomen is soft.     Tenderness: There is no abdominal tenderness. There is no guarding.  Musculoskeletal:     Comments: 2+ pitting edema bilateral lower extremities.  Clear serous drainage.  Skin:    General: Skin is warm and dry.     Coloration: Skin is pale.  Neurological:     Mental Status: He is oriented to person, place, and time.     Coordination: Coordination  normal.  Psychiatric:        Mood and Affect: Mood normal.      ED Treatments / Results  Labs (all labs ordered are listed, but only abnormal results are displayed) Labs Reviewed  BASIC METABOLIC PANEL - Abnormal; Notable for the following components:      Result Value   Sodium 130 (*)    Potassium 5.6 (*)    Chloride 95 (*)    Glucose, Bld 432 (*)    BUN 52 (*)    Creatinine, Ser 1.50 (*)    Calcium 7.5 (*)    GFR calc non Af Amer 50 (*)    GFR calc Af Amer 58 (*)    All other components within normal limits  HEPATIC FUNCTION PANEL - Abnormal; Notable for the following components:   Total Protein 5.9 (*)    Albumin 2.4 (*)    Alkaline Phosphatase 147 (*)    Total Bilirubin 2.0 (*)    Bilirubin, Direct 0.6 (*)    Indirect Bilirubin 1.4 (*)    All other components within normal limits  LACTIC ACID, PLASMA - Abnormal; Notable for the following components:   Lactic Acid, Venous 2.3 (*)    All other components within normal limits  CBC WITH DIFFERENTIAL/PLATELET - Abnormal; Notable for the following components:   WBC 0.4 (*)    RBC 3.26 (*)    Hemoglobin 8.8 (*)    HCT 27.9 (*)    Platelets 61 (*)    Neutro Abs 0.3 (*)    Lymphs Abs 0.1 (*)    Monocytes Absolute 0.0 (*)    All other components within normal limits  CBG MONITORING, ED - Abnormal; Notable for the following components:   Glucose-Capillary 376 (*)    All other components within normal  limits  CBC WITH DIFFERENTIAL/PLATELET  PROTIME-INR  URINALYSIS, ROUTINE W REFLEX MICROSCOPIC  LACTIC ACID, PLASMA  MAGNESIUM  PHOSPHORUS  POTASSIUM  I-STAT TROPONIN, ED  CBG MONITORING, ED    EKG EKG Interpretation  Date/Time:  Monday September 14 2018 10:45:55 EST Ventricular Rate:  175 PR Interval:    QRS Duration: 88 QT Interval:  239 QTC Calculation: 408 R Axis:   -11 Text Interpretation:  atrial fibrillation Anteroseptal infarct, old Nonspecific T abnormalities, lateral leads abnormal.  nosig interval change from first previous Confirmed by Charlesetta Shanks [phone]) on 09/09/2018 1:06:14 PM   Radiology Dg Chest 2 View  Result Date: 09/09/2018 CLINICAL DATA:  History of lung cancer, shortness of breath. EXAM: CHEST - 2 VIEW COMPARISON:  Radiographs of September 03, 2018. CT scan of August 24, 2018. FINDINGS: The heart size and mediastinal contours are within normal limits. No pneumothorax is noted. Right lung is clear. Stable appearance of large necrotic mass seen involving the left lower lung with lytic destruction of adjacent left ribs. IMPRESSION: Stable appearance of large necrotic mass seen in left lower lobe lytic destruction of adjacent left ribs. Electronically Signed   By: Marijo Conception, M.D.   On: 09/04/2018 11:44   Dg Chest Port 1 View  Result Date: 09/13/2018 CLINICAL DATA:  Shortness of breath.  History of lung cancer. EXAM: PORTABLE CHEST 1 VIEW COMPARISON:  And 09/05/2018. PA and lateral chest earlier today CT chest 08/24/2018. FINDINGS: Large necrotic mass in the left chest invading the left chest wall is unchanged. The right lung is clear. Emphysema is noted. Heart size is normal. IMPRESSION: No acute disease in patient with a large necrotic left lung mass.  Emphysema. Electronically Signed   By: Inge Rise M.D.   On: 09/07/2018 14:22    Procedures Procedures (including critical care time) CRITICAL CARE Performed by: Charlesetta Shanks   Total critical care  time: 45 minutes  Critical care time was exclusive of separately billable procedures and treating other patients.  Critical care was necessary to treat or prevent imminent or life-threatening deterioration.  Critical care was time spent personally by me on the following activities: development of treatment plan with patient and/or surrogate as well as nursing, discussions with consultants, evaluation of patient's response to treatment, examination of patient, obtaining history from patient or surrogate, ordering and performing treatments and interventions, ordering and review of laboratory studies, ordering and review of radiographic studies, pulse oximetry and re-evaluation of patient's condition. Medications Ordered in ED Medications  sodium chloride 0.9 % bolus 500 mL (0 mLs Intravenous Stopped 08/22/2018 1142)    Followed by  0.9 %  sodium chloride infusion (1,000 mLs Intravenous New Bag/Given 09/02/2018 1048)  diltiazem (CARDIZEM) 1 mg/mL load via infusion 10 mg (10 mg Intravenous Bolus from Bag 09/09/2018 1216)    And  diltiazem (CARDIZEM) 100 mg in dextrose 5% 126mL (1 mg/mL) infusion (0 mg/hr Intravenous Stopped 08/23/2018 1336)  albuterol (PROVENTIL) (2.5 MG/3ML) 0.083% nebulizer solution 5 mg (5 mg Nebulization Given 09/05/2018 1034)  morphine 4 MG/ML injection 4 mg (4 mg Intravenous Given 09/11/2018 1037)  diltiazem (CARDIZEM) injection 10 mg (10 mg Intravenous Given 09/09/2018 1055)  sodium chloride 0.9 % bolus 500 mL (0 mLs Intravenous Stopped 09/07/2018 1315)  amiodarone (NEXTERONE) IV bolus only 150 mg/100 mL (0 mg Intravenous Stopped 08/29/2018 1439)     Initial Impression / Assessment and Plan / ED Course  I have reviewed the triage vital signs and the nursing notes.  Pertinent labs & imaging results that were available during my care of the patient were reviewed by me and considered in my medical decision making (see chart for details).  Clinical Course as of Sep 14 1518  Mon Sep 14, 2018  1312  Consult: Dr. Nelda Marseille intensivist.  Advised if pressures remain low and patient tachycardic could transition to amiodarone for rate control.  Admit to hospitalist service stepdown and reconsult if patient requires pressors or other interventions.   [MP]  4097 Patient initially had improved significantly with treatment with Cardizem and pain control.  He was much more comfortable and oxygen saturations stable on 2 L nasal cannula.  Blood pressures had stabilized in the 35H systolic with maps of 29J.  Patient however fairly acutely now is complaining of increased shortness of breath.  Patient has had fluid resuscitation 1.5 L for pressure support and rehydration with AKI.   [MP]  2426 Patient reports short of breath is still better than it was on arrival.  He reports that he does not have any chest pain beyond what was been typical with his cancer.  Patient does not know if he has been on Xarelto.  He reports this is brother gives him his medications.   [MP]  8341 Consult: Reviewed with Dr. Malachy Mood oncology.  No immediate concerns with using amiodarone for this patient that he is aware of.  Advises to consult Dr. Julien Nordmann once patient is admitted.   [MP]  1427 Patient reports that he did not get any medications prescribed to him when he left AMA.  His brother confirmed that he has not gotten any Xarelto or any medications that he did not have prior to his hospitalization.   [  MP]  9977 Patient has not been anticoagulated.  Not good candidate for cardioversion unless emergent.   [MP]  1428 Patient's mental status remains lucid.  He answers questions appropriately.  He is conversing with his brother and myself.  No increased respiratory distress.  Breathing with nasal cannula supplement.   [MP]    Clinical Course User Index [MP] Charlesetta Shanks, MD   Patient appears have untreated atrial fibrillation with rapid ventricular response since his hospitalization 8 days ago.  He has become increasingly short of  breath and uncomfortable.  He has not had fever.  Chest x-ray continues to show patient's pulmonary mass consistent with cancer but no acute pneumonia.  Patient has not had increasing cough or fever to suggest infectious etiology at this time.  Have high suspicion for untreated atrial fibrillation is primary etiology of patient's symptoms in conjunction with his underlying lung cancer.  Treatment with fluids, Cardizem and morphine initiated.  Patient had good responses.  He did however start to feel more short of breath and pressures became lower.  Thus amiodarone administered as outlined above for consultation with Dr. Nelda Marseille.  Cardizem currently discontinued.  Patient will be admitted to [facility] hospitalist service for ongoing management and treatment.  Final Clinical Impressions(s) / ED Diagnoses   Final diagnoses:  Atrial fibrillation with RVR (HCC)  Acute respiratory distress  AKI (acute kidney injury) ([place])  Malignant neoplasm of overlapping sites of left lung [facility])    ED Discharge Orders    None       Charlesetta Shanks, MD 09/09/2018 1525

## 2018-09-14 NOTE — ED Notes (Signed)
Bed: WA20 Expected date:  Expected time:  Means of arrival:  Comments: Cancer

## 2018-09-14 NOTE — Progress Notes (Signed)
ANTICOAGULATION CONSULT NOTE - Initial Consult  Pharmacy Consult for xarelto Indication: atrial fibrillation  Allergies  Allergen Reactions  . Solu-Medrol [Methylprednisolone] Shortness Of Breath    Patient Measurements: Height: 5\' 9"  (175.3 cm) Weight: 198 lb 6.6 oz (90 kg) IBW/kg (Calculated) : 70.7   Vital Signs: Temp: 98.2 F (36.8 C) (01/27 1745) Temp Source: Oral (01/27 1745) BP: 105/81 (01/27 1745) Pulse Rate: 153 (01/27 1745)  Labs: Recent Labs    08/24/2018 1029 08/21/2018 1152 09/02/2018 1625  HGB SPECIMEN CLOTTED 8.8* 8.6*  HCT SPECIMEN CLOTTED 27.9* 28.0*  PLT SPECIMEN CLOTTED 61* 62*  LABPROT 14.6  --   --   INR 1.15  --   --   CREATININE 1.50*  --   --     Estimated Creatinine Clearance: 58.8 mL/min (A) (by C-G formula based on SCr of 1.5 mg/dL (H)).   Medical History: Past Medical History:  Diagnosis Date  . Cancer (HCC)    Rib  . Diabetes mellitus without complication ([place])   . Fractured rib   . Hypertension   . Stroke ([place])   . Ulcer      Assessment:  58 y.o. male with medical history significant of H/O TIA, HTN, DM2, Lung cancer, cataracts, Afib who presents for DOE.  He reports that he left the hospital AMA at last visit and was not given Rx for metoprolol or xarelto. Xarelto to be resumed, pharmacy to dose.  Goal of Therapy:  xarelto per renal function and indication   Plan:  xarelto 20mg  po daily at 1800 Follow renal function Pharmacy will sign off and follow peripherally  Dolly Rias RPh 08/31/2018, 6:28 PM Pager [phone]

## 2018-09-14 NOTE — ED Notes (Signed)
PT DECLINES TO WEAR ANY TYPE OF MASK ENDORSING THAT IT MAKES HIM FEEL SMOTHERED AND MORE HOT. PT RETURNED TO 02 SAT 4LNC. PT'S COVERED TURNED BACK. PT'S HAS NO COMPLAINTS AT THIS TIME

## 2018-09-14 NOTE — ED Notes (Signed)
Date and time results received: 08/22/2018 12:42 (use smartphrase ".now" to insert current time)  Test: WBC Critical Value: 0.4  Name of Provider Notified: Johnney Killian, MD   Orders Received? Or Actions Taken?: waitng for orders

## 2018-09-14 NOTE — Progress Notes (Signed)
Pt presented to [facility] clinic prior to radiation treatment today. Pt with c/o shortness of breath, preventing laying flat on treatment table. Pt also reports vomiting when bending at waist. Pt denies cough. Strongly encouraged pt to present to ED. Pt requested to see Dr. Sondra Come first. After evaluation by Dr. Sondra Come, pt agreeable to transport to ED. Pt transported to ED room 20 by this RN, WC, and 3L [place] supplemental O2. Report given to ED RN Rolla Plate.   Pt has completed 20 fractions of planned 30. Pt did not receive treatment today.   Loma Sousa, RN BSN CNRN

## 2018-09-14 NOTE — ED Notes (Signed)
Paged pharmacy for cardizem.

## 2018-09-14 NOTE — ED Triage Notes (Signed)
Presented to cancer center for treatment and was unable to lay flat due to shob and unable to bend forward at the waist causing emesis.   Patient had a half of a meal last night.   Last treatment Friday.   2 weeks ago patient went to have cataracts removed and canceled due to a.fib RVR and sent [facility] ED.   Hx. Lung cancer   Patient is poor historian  Patient stated, "I need an arigato bag". Explained to patient he is in the ED.

## 2018-09-14 NOTE — ED Notes (Signed)
ED TO INPATIENT HANDOFF REPORT  Name/Age/Gender Dylan Jacobs 58 y.o. male  Code Status Code Status History    Date Active Date Inactive Code Status Order ID Comments User Context   09/03/2018 1859 09/07/2018 0102 Full Code [id number]  Sid Falcon, MD Inpatient   08/23/2018 1445 08/25/2018 1847 Full Code [id number]  Edwin Dada, MD ED      Home/SNF/Other Home  Chief Complaint shob;emesis  Level of Care/Admitting Diagnosis ED Disposition    ED Disposition Condition [place]: [facility] [[id number]]  Level of Care: Stepdown [14]  Admit to SDU based on following criteria: Cardiac Instability:  Patients experiencing chest pain, unconfirmed MI and stable, arrhythmias and CHF requiring medical management and potentially compromising patient's stability  Admit to SDU based on following criteria: Hemodynamic compromise or significant risk of instability:  Patient requiring short term acute titration and management of vasoactive drips, and invasive monitoring (i.e., CVP and Arterial line).  Diagnosis: Atrial fibrillation with RVR [facility]) [[id number]]  Admitting Physician: Sid Falcon [phone]  Attending Physician: Sid Falcon [phone]  Estimated length of stay: 3 - 4 days  Certification:: I certify this patient will need inpatient services for at least 2 midnights  PT Class (Do Not Modify): Inpatient [101]  PT Acc Code (Do Not Modify): Private [1]       Medical History Past Medical History:  Diagnosis Date  . Cancer (HCC)    Rib  . Diabetes mellitus without complication ([place])   . Fractured rib   . Hypertension   . Stroke ([place])   . Ulcer     Allergies Allergies  Allergen Reactions  . Solu-Medrol [Methylprednisolone] Shortness Of Breath    IV Location/Drains/Wounds Patient Lines/Drains/Airways Status   Active Line/Drains/Airways    Name:   Placement date:   Placement time:   Site:   Days:   Peripheral IV 08/21/2018 Left  Forearm   09/03/2018    1025    Forearm   less than 1   Peripheral IV 08/31/2018 Right Forearm   09/13/2018    1212    Forearm   less than 1   Pressure Injury 09/03/18 Stage II -  Partial thickness loss of dermis presenting as a shallow open ulcer with a red, pink wound bed without slough. Stage 2-Open skin Pink    09/03/18    1900     11   Wound / Incision (Open or Dehisced) 08/28/2018 Buttocks Medial STAGE II   08/22/2018    1249    Buttocks   less than 1   Wound / Incision (Open or Dehisced) 09/07/2018 Buttocks Right STAGE II   09/08/2018    1250    Buttocks   less than 1   Wound / Incision (Open or Dehisced) 08/22/2018 Other (Comment) Leg Right;Left WEEPING   09/13/2018    1251    Leg   less than 1          Labs/Imaging Results for orders placed or performed during the hospital encounter of 08/22/2018 (from the past 48 hour(s))  CBG monitoring, ED     Status: Abnormal   Collection Time: 09/11/2018 10:20 AM  Result Value Ref Range   Glucose-Capillary 376 (H) 70 - 99 mg/dL  CBC with Differential     Status: None   Collection Time: 09/05/2018 10:29 AM  Result Value Ref Range   WBC SPECIMEN CLOTTED 4.0 - 10.5 K/uL    Comment: INFORMED  KAREN, ED @1128  ON 1.27.2020 BY NMCCOY CORRECTED ON 01/27 AT 1129: PREVIOUSLY REPORTED AS 0.5 This critical result has verified and been called to ROFTER, M. RN by Raelyn Ensign on 01 27 2020 at 1107, and has been read back. CRITICAL RESULT VERIFIED    RBC SPECIMEN CLOTTED 4.22 - 5.81 MIL/uL    Comment: INFORMED KAREN, ED @1128  ON 1.27.2020 BY NMCCOY CORRECTED ON 01/27 AT 1129: PREVIOUSLY REPORTED AS 3.90    Hemoglobin SPECIMEN CLOTTED 13.0 - 17.0 g/dL    Comment: INFORMED KAREN, ED @1128  ON 1.27.2020 BY NMCCOY CORRECTED ON 01/27 AT 1129: PREVIOUSLY REPORTED AS 10.3    HCT SPECIMEN CLOTTED 39.0 - 52.0 %    Comment: INFORMED KAREN, ED @1128  ON 1.27.2020 BY NMCCOY CORRECTED ON 01/27 AT 1129: PREVIOUSLY REPORTED AS 33.4    MCV SPECIMEN CLOTTED 80.0 - 100.0 fL    Comment:  INFORMED KAREN, ED @1128  ON 1.27.2020 BY NMCCOY CORRECTED ON 01/27 AT 1129: PREVIOUSLY REPORTED AS 85.6    MCH SPECIMEN CLOTTED 26.0 - 34.0 pg    Comment: INFORMED KAREN, ED @1128  ON 1.27.2020 BY NMCCOY CORRECTED ON 01/27 AT 1129: PREVIOUSLY REPORTED AS 26.4    MCHC SPECIMEN CLOTTED 30.0 - 36.0 g/dL    Comment: INFORMED KAREN, ED @1128  ON 1.27.2020 BY NMCCOY CORRECTED ON 01/27 AT 1129: PREVIOUSLY REPORTED AS 30.8    RDW SPECIMEN CLOTTED 11.5 - 15.5 %    Comment: INFORMED KAREN, ED @1128  ON 1.27.2020 BY NMCCOY CORRECTED ON 01/27 AT 1129: PREVIOUSLY REPORTED AS 15.1    Platelets SPECIMEN CLOTTED 150 - 400 K/uL    Comment: INFORMED KAREN, ED @1128  ON 1.27.2020 BY NMCCOY   nRBC SPECIMEN CLOTTED 0.0 - 0.2 %    Comment: INFORMED KAREN, ED @1128  ON 1.27.2020 BY NMCCOY CORRECTED ON 01/27 AT 1129: PREVIOUSLY REPORTED AS 0.0    Neutrophils Relative % SPECIMEN CLOTTED %    Comment: INFORMED KAREN, ED @1128  ON 1.27.2020 BY NMCCOY   Neutro Abs SPECIMEN CLOTTED 1.7 - 7.7 K/uL    Comment: INFORMED KAREN, ED @1128  ON 1.27.2020 BY NMCCOY   Band Neutrophils SPECIMEN CLOTTED %    Comment: INFORMED KAREN, ED @1128  ON 1.27.2020 BY NMCCOY   Lymphocytes Relative SPECIMEN CLOTTED %    Comment: INFORMED KAREN, ED @1128  ON 1.27.2020 BY NMCCOY   Lymphs Abs SPECIMEN CLOTTED 0.7 - 4.0 K/uL    Comment: INFORMED KAREN, ED @1128  ON 1.27.2020 BY NMCCOY   Monocytes Relative SPECIMEN CLOTTED %    Comment: INFORMED KAREN, ED @1128  ON 1.27.2020 BY NMCCOY   Monocytes Absolute SPECIMEN CLOTTED 0.1 - 1.0 K/uL    Comment: INFORMED KAREN, ED @1128  ON 1.27.2020 BY NMCCOY   Eosinophils Relative SPECIMEN CLOTTED %    Comment: INFORMED KAREN, ED @1128  ON 1.27.2020 BY NMCCOY   Eosinophils Absolute SPECIMEN CLOTTED 0.0 - 0.5 K/uL    Comment: INFORMED KAREN, ED @1128  ON 1.27.2020 BY NMCCOY   Basophils Relative SPECIMEN CLOTTED %    Comment: INFORMED KAREN, ED @1128  ON 1.27.2020 BY NMCCOY   Basophils Absolute SPECIMEN  CLOTTED 0.0 - 0.1 K/uL    Comment: INFORMED KAREN, ED @1128  ON 1.27.2020 BY NMCCOY   WBC Morphology SPECIMEN CLOTTED     Comment: INFORMED KAREN, ED @1128  ON 1.27.2020 BY NMCCOY   RBC Morphology SPECIMEN CLOTTED     Comment: INFORMED KAREN, ED @1128  ON 1.27.2020 BY NMCCOY   Smear Review SPECIMEN CLOTTED     Comment: INFORMED KAREN, ED @1128  ON 1.27.2020 BY NMCCOY  Other SPECIMEN CLOTTED %    Comment: INFORMED KAREN, ED @1128  ON 1.27.2020 BY NMCCOY   nRBC SPECIMEN CLOTTED 0 /100 WBC    Comment: INFORMED KAREN, ED @1128  ON 1.27.2020 BY NMCCOY   Metamyelocytes Relative SPECIMEN CLOTTED %    Comment: INFORMED KAREN, ED @1128  ON 1.27.2020 BY NMCCOY   Myelocytes SPECIMEN CLOTTED %    Comment: INFORMED KAREN, ED @1128  ON 1.27.2020 BY NMCCOY   Promyelocytes Relative SPECIMEN CLOTTED %    Comment: INFORMED KAREN, ED @1128  ON 1.27.2020 BY NMCCOY   Blasts SPECIMEN CLOTTED %    Comment: INFORMED KAREN, ED @1128  ON 1.27.2020 BY [facility] Performed at [place] [address] [place], [place] [address]   Protime-INR     Status: None   Collection Time: 08/25/2018 10:29 AM  Result Value Ref Range   Prothrombin Time 14.6 11.4 - 15.2 seconds   INR 1.15     Comment: Performed at [facility], [place] [address] [place], [place] [address]  Basic metabolic panel     Status: Abnormal   Collection Time: 09/13/2018 10:29 AM  Result Value Ref Range   Sodium 130 (L) 135 - 145 mmol/L   Potassium 5.6 (H) 3.5 - 5.1 mmol/L   Chloride 95 (L) 98 - 111 mmol/L   CO2 23 22 - 32 mmol/L   Glucose, Bld 432 (H) 70 - 99 mg/dL   BUN 52 (H) 6 - 20 mg/dL   Creatinine, Ser 1.50 (H) 0.61 - 1.24 mg/dL   Calcium 7.5 (L) 8.9 - 10.3 mg/dL   GFR calc non Af Amer 50 (L) >60 mL/min   GFR calc Af Amer 58 (L) >60 mL/min   Anion gap 12 5 - 15    Comment: Performed at [facility], [place] [address] [place], [place] [address]  Hepatic function panel     Status: Abnormal    Collection Time: 09/13/2018 10:29 AM  Result Value Ref Range   Total Protein 5.9 (L) 6.5 - 8.1 g/dL   Albumin 2.4 (L) 3.5 - 5.0 g/dL   AST 17 15 - 41 U/L   ALT 19 0 - 44 U/L   Alkaline Phosphatase 147 (H) 38 - 126 U/L   Total Bilirubin 2.0 (H) 0.3 - 1.2 mg/dL   Bilirubin, Direct 0.6 (H) 0.0 - 0.2 mg/dL   Indirect Bilirubin 1.4 (H) 0.3 - 0.9 mg/dL    Comment: Performed at [facility], [place] [address], [place], [place] [address]  I-stat troponin, ED     Status: None   Collection Time: 09/08/2018 10:37 AM  Result Value Ref Range   Troponin i, poc 0.01 0.00 - 0.08 ng/mL   Comment 3            Comment: Due to the release kinetics of cTnI, a negative result within the first hours of the onset of symptoms does not rule out myocardial infarction with certainty. If myocardial infarction is still suspected, repeat the test at appropriate intervals.   Lactic acid, plasma     Status: Abnormal   Collection Time: 09/13/2018 11:52 AM  Result Value Ref Range   Lactic Acid, Venous 2.3 (HH) 0.5 - 1.9 mmol/L    Comment: CRITICAL RESULT CALLED TO, READ BACK BY AND VERIFIED WITH: HALL,C. RN AT 1232 08/31/2018 MULLINS,T Performed at [facility], [place] [address], [place], [place] [address]   CBC with Differential/Platelet     Status: Abnormal   Collection Time:  08/25/2018 11:52 AM  Result Value Ref Range   WBC 0.4 (LL) 4.0 - 10.5 K/uL    Comment: This critical result has verified and been called to [facility]. RN by Raelyn Ensign on 01 27 2020 at 1239, and has been read back. CRITICAL RESULT VERIFIED   RBC 3.26 (L) 4.22 - 5.81 MIL/uL   Hemoglobin 8.8 (L) 13.0 - 17.0 g/dL   HCT 27.9 (L) 39.0 - 52.0 %   MCV 85.6 80.0 - 100.0 fL   MCH 27.0 26.0 - 34.0 pg   MCHC 31.5 30.0 - 36.0 g/dL   RDW 14.9 11.5 - 15.5 %   Platelets 61 (L) 150 - 400 K/uL    Comment: Immature Platelet Fraction may be clinically indicated, consider ordering this additional test [id number]    nRBC 0.0 0.0  - 0.2 %   Neutrophils Relative % 62 %   Neutro Abs 0.3 (L) 1.7 - 7.7 K/uL   Lymphocytes Relative 15 %   Lymphs Abs 0.1 (L) 0.7 - 4.0 K/uL   Monocytes Relative 10 %   Monocytes Absolute 0.0 (L) 0.1 - 1.0 K/uL   Eosinophils Relative 3 %   Eosinophils Absolute 0.0 0.0 - 0.5 K/uL   Basophils Relative 0 %   Basophils Absolute 0.0 0.0 - 0.1 K/uL   WBC Morphology TOXIC GRANULATION    Immature Granulocytes 10 %   Abs Immature Granulocytes 0.04 0.00 - 0.07 K/uL   Target Cells PRESENT     Comment: Performed at [facility], [place] [address], [place], [place] [address]   Dg Chest 2 View  Result Date: 08/19/2018 CLINICAL DATA:  History of lung cancer, shortness of breath. EXAM: CHEST - 2 VIEW COMPARISON:  Radiographs of September 03, 2018. CT scan of August 24, 2018. FINDINGS: The heart size and mediastinal contours are within normal limits. No pneumothorax is noted. Right lung is clear. Stable appearance of large necrotic mass seen involving the left lower lung with lytic destruction of adjacent left ribs. IMPRESSION: Stable appearance of large necrotic mass seen in left lower lobe lytic destruction of adjacent left ribs. Electronically Signed   By: Marijo Conception, M.D.   On: 09/03/2018 11:44   Dg Chest Port 1 View  Result Date: 09/04/2018 CLINICAL DATA:  Shortness of breath.  History of lung cancer. EXAM: PORTABLE CHEST 1 VIEW COMPARISON:  And 09/05/2018. PA and lateral chest earlier today CT chest 08/24/2018. FINDINGS: Large necrotic mass in the left chest invading the left chest wall is unchanged. The right lung is clear. Emphysema is noted. Heart size is normal. IMPRESSION: No acute disease in patient with a large necrotic left lung mass. Emphysema. Electronically Signed   By: Inge Rise M.D.   On: 08/30/2018 14:22   EKG Interpretation  Date/Time:  Monday September 14 2018 10:45:55 EST Ventricular Rate:  175 PR Interval:    QRS Duration: 88 QT Interval:  239 QTC  Calculation: 408 R Axis:   -11 Text Interpretation:  atrial fibrillation Anteroseptal infarct, old Nonspecific T abnormalities, lateral leads abnormal.  nosig interval change from first previous Confirmed by Charlesetta Shanks [phone]) on 09/08/2018 1:06:14 PM   Pending Labs Unresulted Labs (From admission, onward)    Start     Ordered   08/30/2018 1655  CBC  (enoxaparin (LOVENOX)    CrCl >/= 30 ml/min)  Once,   R    Comments:  Baseline for enoxaparin therapy IF NOT ALREADY DRAWN.  Notify MD if PLT < 100  K.    09/05/2018 1654   09/13/2018 1655  TSH  Once,   R     08/25/2018 1654   08/28/2018 1655  Hepatic function panel  Once,   R     09/13/2018 1654   08/29/2018 1352  Potassium  ONCE - STAT,   STAT     09/03/2018 1351   08/22/2018 1350  Magnesium  ONCE - STAT,   STAT     09/08/2018 1349   09/09/2018 1350  Phosphorus  ONCE - STAT,   STAT     08/30/2018 1349   09/06/2018 1043  Lactic acid, plasma  Now then every 2 hours,   STAT     09/05/2018 1042   09/02/2018 1030  Urinalysis, Routine w reflex microscopic  ONCE - STAT,   STAT     08/29/2018 1030   Signed and Held  Creatinine, serum  (enoxaparin (LOVENOX)    CrCl >/= 30 ml/min)  Once,   R    Comments:  Baseline for enoxaparin therapy IF NOT ALREADY DRAWN.    Signed and Held   Signed and Held  Creatinine, serum  (enoxaparin (LOVENOX)    CrCl >/= 30 ml/min)  Weekly,   R    Comments:  while on enoxaparin therapy    Signed and Held   Signed and Held  Basic metabolic panel  Tomorrow morning,   R     Signed and Held   Signed and Held  CBC  Tomorrow morning,   R     Signed and Held          Vitals/Pain Today's Vitals   09/09/2018 1400 09/07/2018 1430 09/09/2018 1645 08/23/2018 1700  BP:  90/72 104/75 98/61  Pulse:  (!) 102 (!) 120 66  Resp:  (!) 23 (!) 28 20  Temp:      TempSrc:      SpO2: 92% 92% (!) 81% 95%  Weight:      Height:      PainSc:        Isolation Precautions No active isolations  Medications Medications  sodium chloride 0.9 % bolus 500 mL (0  mLs Intravenous Stopped 09/11/2018 1142)    Followed by  0.9 %  sodium chloride infusion (1,000 mLs Intravenous New Bag/Given 08/21/2018 1048)  diltiazem (CARDIZEM) 1 mg/mL load via infusion 10 mg (10 mg Intravenous Bolus from Bag 08/28/2018 1216)    And  diltiazem (CARDIZEM) 100 mg in dextrose 5% 175mL (1 mg/mL) infusion (0 mg/hr Intravenous Stopped 09/12/2018 1336)  prochlorperazine (COMPAZINE) tablet 10 mg (has no administration in time range)  amiodarone (NEXTERONE) 1.8 mg/mL load via infusion 150 mg (has no administration in time range)  amiodarone (NEXTERONE PREMIX) 360-4.14 MG/200ML-% (1.8 mg/mL) IV infusion (has no administration in time range)    Followed by  amiodarone (NEXTERONE PREMIX) 360-4.14 MG/200ML-% (1.8 mg/mL) IV infusion (has no administration in time range)  albuterol (PROVENTIL) (2.5 MG/3ML) 0.083% nebulizer solution 5 mg (5 mg Nebulization Given 09/17/2018 1034)  morphine 4 MG/ML injection 4 mg (4 mg Intravenous Given 09/05/2018 1037)  diltiazem (CARDIZEM) injection 10 mg (10 mg Intravenous Given 08/19/2018 1055)  sodium chloride 0.9 % bolus 500 mL (0 mLs Intravenous Stopped 09/06/2018 1315)  amiodarone (NEXTERONE) IV bolus only 150 mg/100 mL (0 mg Intravenous Stopped 08/23/2018 1439)    Mobility manual wheelchair

## 2018-09-14 NOTE — ED Notes (Signed)
ED Provider at bedside. PHFFIER

## 2018-09-14 NOTE — ED Notes (Signed)
ED Provider at bedside. 

## 2018-09-14 NOTE — ED Notes (Signed)
EKG done by tech

## 2018-09-14 NOTE — ED Notes (Signed)
Date and time results received: 09/17/2018 1735 (use smartphrase ".now" to insert current time)  Test: lactic acid Critical Value: 2.4  Name of Provider Notified: Janene Harvey RN   Orders Received? Or Actions Taken?: Actions Taken: notified [place] primary nurse of lactic acid 2.4 so she can inform admission doctor

## 2018-09-14 NOTE — ED Notes (Signed)
PT GIVEN HIS OWN FAN, ICE PACKS AND REPOSITIONED FOR COMFORT. BROTHER IS PRESENT. DJ EMT ASSISTED WITH CARE

## 2018-09-14 NOTE — ED Notes (Signed)
PT ENCOURAGED TO VOID. PT HAS URINAL AT BEDSIDE. NO URGE TO VOID.

## 2018-09-14 NOTE — H&P (Addendum)
History and Physical    Dylan Jacobs UMP:[id number] DOB: January 23, 1961 DOA: 09/03/2018  PCP: [name], MD  Patient coming from: Home  Chief Complaint: DOE  HPI: Dylan Jacobs is a 58 y.o. male with medical history significant of H/O TIA, HTN, DM2, Lung cancer, cataracts, Afib who presents for DOE.  He reports that he left the hospital AMA at last visit and was not given Rx for metoprolol or xarelto.  He went home as he was not happy being in the hospital.  He reports today that he has LE swelling, DOE, orthopnea and he is bringing up frothy white stuff when he lies flat.  He further notes inability to walk as far as he used to 2/2 dyspnea.  He continues to use oxygen at home.  He has had some palpitations, but no chest pain, fever, change in urination, change in bowel habits.  His HR on admission was in the 170s.  He is upset at the prospect of coming back in to the hospital.  He reports having "bed sores" but he was unable to turn for me to evaluate.  He further requests an air mattress.  He had a TTE at last visit which showed LVH - likely diastolic dysfunction. He is also very malnourished and has a low albumin to explain some of his swelling.  He had chemotherapy for lung cancer on 1/20 and is now pancytopenic.  He denied cough in the ED beyond what he normally has.    ED Course: In the ED, he ws noted to be in Afib with RVR.  He was placed on diltiazem drip, however, his blood pressure did not tolerate higher doses of the medication.  The EDP discussed with Dr. Nelda Marseille who recommended amiodarone drip which was ordered.  TSH and LFTs also ordered.  When I saw him, his BP had returned to the 400Q systolic so I continued low dose diltiazem along with the amiodarone drip.  He further has an AKI, elevated glucose and a pancytopenia, likely related to his recent chemotherapy.    Review of Systems: As per HPI otherwise 10 point review of systems negative.    Past Medical History:  Diagnosis  Date  . Cancer (HCC)    Rib  . Diabetes mellitus without complication ([place])   . Fractured rib   . Hypertension   . Stroke ([place])   . Ulcer     Past Surgical History:  Procedure Laterality Date  . NO PAST SURGERIES     Reviewed with patient.   reports that he has been smoking cigarettes. He has a 60.00 pack-year smoking history. He has never used smokeless tobacco. He reports current alcohol use. He reports current drug use. Drug: Marijuana.  Allergies  Allergen Reactions  . Solu-Medrol [Methylprednisolone] Shortness Of Breath   Reviewed with patient.  Family History  Problem Relation Age of Onset  . Prostate cancer Father   . Cancer Brother   . ALS Brother   . COPD Brother     Prior to Admission medications   Medication Sig Start Date End Date Taking? Authorizing Provider  ALPRAZolam Duanne Moron) 0.5 MG tablet Take 1 tablet (0.5 mg total) by mouth 3 (three) times daily as needed for anxiety. 08/31/18  Yes Causey, Charlestine Massed, NP  amLODipine (NORVASC) 5 MG tablet Take 1 tablet (5 mg total) by mouth daily. 08/26/18  Yes Purohit, Konrad Dolores, MD  fluconazole (DIFLUCAN) 100 MG tablet Take 1 tablet (100 mg total) by mouth daily. 09/02/18  Yes Gery Pray, MD  hydrochlorothiazide (HYDRODIURIL) 25 MG tablet Take 1 tablet (25 mg total) by mouth daily. 08/26/18  Yes Purohit, Konrad Dolores, MD  Menthol (HALLS COUGH DROPS) 5.8 MG LOZG Use as directed 1 lozenge in the mouth or throat daily as needed (cough).   Yes [provider]  Menthol (RICOLA) LOZG Use as directed 1 lozenge in the mouth or throat daily as needed (cough).   Yes [provider]  oxyCODONE-acetaminophen (PERCOCET) 7.5-325 MG tablet Take 1 tablet by mouth every 4 (four) hours as needed for severe pain. 08/22/18  Yes Gery Pray, MD  potassium chloride SA (K-DUR,KLOR-CON) 20 MEQ tablet Take 1 tablet (20 mEq total) by mouth 2 (two) times daily. Patient taking differently: Take 40 mEq by mouth daily.  08/31/18  Yes Causey,  Charlestine Massed, NP  prochlorperazine (COMPAZINE) 10 MG tablet TAKE 1 TABLET BY MOUTH EVERY 6 HOURS AS NEEDED FOR NAUSEA OR VOMITING Patient taking differently: Take 10 mg by mouth 2 (two) times daily as needed for nausea or vomiting.  09/09/18  Yes Curt Bears, MD  sitaGLIPtin-metformin (JANUMET) 50-1000 MG tablet Take 1 tablet by mouth 2 (two) times daily with a meal. 08/25/18 09/24/18 Yes Purohit, Konrad Dolores, MD  methylPREDNISolone (MEDROL) 4 MG tablet Take 1 tablet (4 mg total) by mouth daily. Taper 6,5,4,3,2,1 Patient not taking: Reported on 09/13/2018 08/31/18   Gardenia Phlegm, NP  prochlorperazine (COMPAZINE) 10 MG tablet Take 1 tablet (10 mg total) by mouth every 6 (six) hours as needed for nausea or vomiting. Patient not taking: Reported on 08/26/2018 09/09/18   Curt Bears, MD    Physical Exam:  Constitutional: Appears fatigued, older than stated age.  Vitals:   09/02/2018 1700 08/29/2018 1715 09/09/2018 1735 08/29/2018 1745  BP: 98/61 97/81 107/83 105/81  Pulse: 66 85 (!) 166 (!) 153  Resp: 20 (!) 28 (!) 26 (!) 27  Temp:    98.2 F (36.8 C)  TempSrc:    Oral  SpO2: 95% 91% 91% 91%  Weight:      Height:       Eyes:  lids and conjunctivae normal ENMT: Mucous membranes are dry. Poor dentition.  Neck: normal, supple Respiratory: He is sitting up, tachypneic, using accessory muscles, maintaining saturation on 2LNC.  He could not sit up further to let me listen to his back fully.  He has significantly decreased breath sounds on the left mid lung.    Cardiovascular: Significant tachycardia, Irreg.  Murmur unable to be appreciated.  He has 2+ pulses in radial arteries, 1+ in DP due to edema.  He has significant LE edema, 3+ pitting Abdomen: + BS, NT, ND Musculoskeletal: Impressive LE edema.  Muscle wasting noted Skin: chronic skin changes in LE.  He has chronic skin roughening on chest, unclear if due to keratosis Neurologic: Grossly intact, moving extremities, feels subjectively  weak.  Psychiatric: Normal judgment and insight. Alert and oriented x 3.    Labs on Admission: I have personally reviewed following labs and imaging studies  CBC: Recent Labs  Lab 09/17/2018 1029 09/03/2018 1152 08/21/2018 1625  WBC SPECIMEN CLOTTED 0.4* 0.3*  NEUTROABS SPECIMEN CLOTTED 0.3*  --   HGB SPECIMEN CLOTTED 8.8* 8.6*  HCT SPECIMEN CLOTTED 27.9* 28.0*  MCV SPECIMEN CLOTTED 85.6 84.6  PLT SPECIMEN CLOTTED 61* 62*   Basic Metabolic Panel: Recent Labs  Lab 08/20/2018 1029 09/13/2018 1625  NA 130*  --   K 5.6* 5.3*  CL 95*  --  CO2 23  --   GLUCOSE 432*  --   BUN 52*  --   CREATININE 1.50*  --   CALCIUM 7.5*  --   MG  --  1.4*  PHOS  --  3.4   GFR: Estimated Creatinine Clearance: 58.8 mL/min (A) (by C-G formula based on SCr of 1.5 mg/dL (H)). Liver Function Tests: Recent Labs  Lab 08/21/2018 1029 09/04/2018 1625  AST 17 10*  ALT 19 17  ALKPHOS 147* 120  BILITOT 2.0* 1.7*  PROT 5.9* 5.0*  ALBUMIN 2.4* 1.9*   No results for input(s): LIPASE, AMYLASE in the last 168 hours. No results for input(s): AMMONIA in the last 168 hours. Coagulation Profile: Recent Labs  Lab 09/18/2018 1029  INR 1.15   Cardiac Enzymes: No results for input(s): CKTOTAL, CKMB, CKMBINDEX, TROPONINI in the last 168 hours. BNP (last 3 results) No results for input(s): PROBNP in the last 8760 hours. HbA1C: No results for input(s): HGBA1C in the last 72 hours. CBG: Recent Labs  Lab 08/22/2018 1020  GLUCAP 376*   Lipid Profile: No results for input(s): CHOL, HDL, LDLCALC, TRIG, CHOLHDL, LDLDIRECT in the last 72 hours. Thyroid Function Tests: No results for input(s): TSH, T4TOTAL, FREET4, T3FREE, THYROIDAB in the last 72 hours. Anemia Panel: No results for input(s): VITAMINB12, FOLATE, FERRITIN, TIBC, IRON, RETICCTPCT in the last 72 hours. Urine analysis:    Component Value Date/Time   COLORURINE AMBER (A) 08/23/2018 1133   APPEARANCEUR HAZY (A) 08/23/2018 1133   LABSPEC 1.025  08/23/2018 1133   PHURINE 5.0 08/23/2018 1133   GLUCOSEU 150 (A) 08/23/2018 1133   HGBUR SMALL (A) 08/23/2018 1133   BILIRUBINUR NEGATIVE 08/23/2018 1133   KETONESUR 5 (A) 08/23/2018 1133   PROTEINUR >=300 (A) 08/23/2018 1133   NITRITE NEGATIVE 08/23/2018 1133   LEUKOCYTESUR NEGATIVE 08/23/2018 1133    Radiological Exams on Admission: Dg Chest 2 View  Result Date: 09/11/2018 CLINICAL DATA:  History of lung cancer, shortness of breath. EXAM: CHEST - 2 VIEW COMPARISON:  Radiographs of September 03, 2018. CT scan of August 24, 2018. FINDINGS: The heart size and mediastinal contours are within normal limits. No pneumothorax is noted. Right lung is clear. Stable appearance of large necrotic mass seen involving the left lower lung with lytic destruction of adjacent left ribs. IMPRESSION: Stable appearance of large necrotic mass seen in left lower lobe lytic destruction of adjacent left ribs. Electronically Signed   By: Marijo Conception, M.D.   On: 08/22/2018 11:44   Dg Chest Port 1 View  Result Date: 09/13/2018 CLINICAL DATA:  Shortness of breath.  History of lung cancer. EXAM: PORTABLE CHEST 1 VIEW COMPARISON:  And 09/05/2018. PA and lateral chest earlier today CT chest 08/24/2018. FINDINGS: Large necrotic mass in the left chest invading the left chest wall is unchanged. The right lung is clear. Emphysema is noted. Heart size is normal. IMPRESSION: No acute disease in patient with a large necrotic left lung mass. Emphysema. Electronically Signed   By: Inge Rise M.D.   On: 09/02/2018 14:22    EKG: Independently reviewed. Afib with RVR  Assessment/Plan Atrial fibrillation with RVR - On diltiazem and amiodarone - monitor in SDU - Restart Xarelto - Monitor BP closely, hold BP medications while on diltiazem - he apparently had a low blood pressure with elevated levels of diltiazem in the ED, but when I saw him SBP was 180.   - Transition to oral medications when able - Cardiology was consulted  by  the ED, but they have not seen patient.  Will need to call in the morning for assistance. - Very small rise in L.A. likely related to accelerated HR (180s on admission).  He will be on fluids for his AKI as below.    AKI - IVF with NS at 100cc/hr for 10 hours, recheck Cr - Appears to be pre-renal with BUN/Cr ratio of > 20 - Trend Cr - Very small rise in Lactic acid (2.4) likely related to severely elevated HR and will be on low dose fluids for AKI as noted above.    Pancytopenia - In the setting of recent chemotherapy - He is supposed to be on medrol, but has not picked up yet, would clarify with Oncology in the AM - Neutropenic precautions - Monitor closely for fever (37.6C in the ED); appears symptoms are related to severe RVR, however, given the pancytopenia he is at risk for infection  Elevated glucose, recent dx of DM2 - SSI - Start lantus 10 units at night - Carb modified diet  Mildly elevated K in the setting of AKI - IVF and recheck    HTN (hypertension) - Hold antihypertensives in the setting of diltiazem drip   Stage III squamous cell carcinoma of left lung - Consider getting his oncologist input about frequent admissions.      DVT prophylaxis: Xarelto  Code Status: Full  Family Communication: Brother at bedside Disposition Plan: Admit for treatment of Afib  Consults called: Cardiology - touch base in the AM Admission status: INP, SDU   Gilles Chiquito MD [facility] If 7PM-7AM, please contact night-coverage [email or web address]   09/01/2018, 6:17 PM

## 2018-09-14 NOTE — ED Notes (Signed)
PT DECLINED TO BE TURNED ON HIS SIDE.

## 2018-09-14 NOTE — ED Notes (Signed)
Patient transported to X-ray 

## 2018-09-14 NOTE — Progress Notes (Signed)
  Amiodarone Drug - Drug Interaction Consult Note  Recommendations:   OK to continue Diltiazem and Diflucan, but would monitor K, Mg, and QTc, especially if HCTZ (from home) is resumed inpatient  If sitagliptin resumed while on amiodarone (or if amiodarone to continue as outpatient), consider reducing dose of sitagliptin as amiodarone can inhibit clearance   Amiodarone is metabolized by the cytochrome P450 system and therefore has the potential to cause many drug interactions. Amiodarone has an average plasma half-life of 50 days (range 20 to 100 days).   There is potential for drug interactions to occur several weeks or months after stopping treatment and the onset of drug interactions may be slow after initiating amiodarone.   []  Statins: Increased risk of myopathy. Simvastatin- restrict dose to 20mg  daily. Other statins: counsel patients to report any muscle pain or weakness immediately.  []  Anticoagulants: Amiodarone can increase anticoagulant effect. Consider warfarin dose reduction. Patients should be monitored closely and the dose of anticoagulant altered accordingly, remembering that amiodarone levels take several weeks to stabilize.  []  Antiepileptics: Amiodarone can increase plasma concentration of phenytoin, the dose should be reduced. Note that small changes in phenytoin dose can result in large changes in levels. Monitor patient and counsel on signs of toxicity.  []  Beta blockers: increased risk of bradycardia, AV block and myocardial depression. Sotalol - avoid concomitant use.  [x]   Calcium channel blockers (diltiazem and verapamil): increased risk of bradycardia, AV block and myocardial depression.  []   Cyclosporine: Amiodarone increases levels of cyclosporine. Reduced dose of cyclosporine is recommended.  []  Digoxin dose should be halved when amiodarone is started.  [x]  Diuretics: increased risk of cardiotoxicity if hypokalemia occurs. (HCTZ home med)  []  Oral  hypoglycemic agents (glyburide, glipizide, glimepiride): increased risk of hypoglycemia. Patient's glucose levels should be monitored closely when initiating amiodarone therapy.   [x]  Drugs that prolong the QT interval:  Torsades de pointes risk may be increased with concurrent use - avoid if possible.  Monitor QTc, also keep magnesium/potassium WNL if concurrent therapy can't be avoided. Marland Kitchen Antibiotics: e.g. fluoroquinolones, erythromycin. . Antiarrhythmics: e.g. quinidine, procainamide, disopyramide, sotalol. . Antipsychotics: e.g. phenothiazines, haloperidol.  . Lithium, tricyclic antidepressants, and methadone.   Thank You,    Reuel Boom, PharmD, BCPS [phone]  09/11/2018, 5:14 PM

## 2018-09-14 NOTE — ED Notes (Signed)
Ekg done and did not cross over given to [place].

## 2018-09-14 NOTE — Consult Note (Signed)
[facility] Nurse wound consult note: Attempted to see x2 today Reason for Consult: Sacral pressure (or moisture) area. Patient was too unstable to be assessed in the ED this afternoon and Critical Care RN just let me know that he was too unstable to se seen at this time.  HR is tachycardic (130-140), patient is sitting upright in the bed in a high Fowler's position using a re breather mask. Will attempt to see tomorrow am.   Thanks, Maudie Flakes, MSN, RN, [place], Arther Abbott  Pager# [phone]

## 2018-09-14 NOTE — ED Notes (Addendum)
ED Provider at bedside. PFIEFFER AT BEDSIDE. CARDIZEM STOPPED PER EDP ORDER

## 2018-09-15 ENCOUNTER — Ambulatory Visit: Payer: Self-pay

## 2018-09-15 ENCOUNTER — Inpatient Hospital Stay (HOSPITAL_COMMUNITY): Payer: Self-pay

## 2018-09-15 ENCOUNTER — Other Ambulatory Visit: Payer: Self-pay

## 2018-09-15 ENCOUNTER — Inpatient Hospital Stay (HOSPITAL_COMMUNITY): Payer: Self-pay | Admitting: Anesthesiology

## 2018-09-15 DIAGNOSIS — E119 Type 2 diabetes mellitus without complications: Secondary | ICD-10-CM

## 2018-09-15 DIAGNOSIS — J189 Pneumonia, unspecified organism: Secondary | ICD-10-CM | POA: Diagnosis present

## 2018-09-15 DIAGNOSIS — E440 Moderate protein-calorie malnutrition: Secondary | ICD-10-CM | POA: Diagnosis present

## 2018-09-15 DIAGNOSIS — J9601 Acute respiratory failure with hypoxia: Secondary | ICD-10-CM

## 2018-09-15 DIAGNOSIS — I10 Essential (primary) hypertension: Secondary | ICD-10-CM

## 2018-09-15 DIAGNOSIS — R69 Illness, unspecified: Secondary | ICD-10-CM

## 2018-09-15 DIAGNOSIS — R0603 Acute respiratory distress: Secondary | ICD-10-CM | POA: Diagnosis present

## 2018-09-15 DIAGNOSIS — Z452 Encounter for adjustment and management of vascular access device: Secondary | ICD-10-CM

## 2018-09-15 DIAGNOSIS — N179 Acute kidney failure, unspecified: Secondary | ICD-10-CM | POA: Diagnosis present

## 2018-09-15 LAB — BLOOD GAS, ARTERIAL
Acid-base deficit: 5.2 mmol/L — ABNORMAL HIGH (ref 0.0–2.0)
Acid-base deficit: 5.3 mmol/L — ABNORMAL HIGH (ref 0.0–2.0)
Acid-base deficit: 5.7 mmol/L — ABNORMAL HIGH (ref 0.0–2.0)
Acid-base deficit: 8.7 mmol/L — ABNORMAL HIGH (ref 0.0–2.0)
Bicarbonate: 22.8 mmol/L (ref 20.0–28.0)
Bicarbonate: 21.7 mmol/L (ref 20.0–28.0)
Bicarbonate: 20.2 mmol/L (ref 20.0–28.0)
Bicarbonate: 17.1 mmol/L — ABNORMAL LOW (ref 20.0–28.0)
Drawn by: 235321
Drawn by: 235321
Drawn by: 441261
Drawn by: 441261
FIO2: 100
FIO2: 100
FIO2: 60
FIO2: 60
LHR: 16 {breaths}/min
MECHVT: 560 mL
MECHVT: 560 mL
MECHVT: 560 mL
MECHVT: 560 mL
O2 Saturation: 83.5 %
O2 Saturation: 98.2 %
O2 Saturation: 90.3 %
O2 Saturation: 91.7 %
PATIENT TEMPERATURE: 98.6
PATIENT TEMPERATURE: 98.6
PCO2 ART: 53.9 mmHg — AB (ref 32.0–48.0)
PCO2 ART: 45.7 mmHg (ref 32.0–48.0)
PCO2 ART: 39.1 mmHg (ref 32.0–48.0)
PEEP: 5 cmH2O
PEEP: 8 cmH2O
PEEP: 8 cmH2O
PEEP: 8 cmH2O
PO2 ART: 69.2 mmHg — AB (ref 83.0–108.0)
Patient temperature: 98.6
Patient temperature: 98.6
RATE: 24 resp/min
RATE: 30 resp/min
pCO2 arterial: 64.7 mmHg — ABNORMAL HIGH (ref 32.0–48.0)
pH, Arterial: 7.173 — CL (ref 7.350–7.450)
pH, Arterial: 7.228 — ABNORMAL LOW (ref 7.350–7.450)
pH, Arterial: 7.268 — ABNORMAL LOW (ref 7.350–7.450)
pH, Arterial: 7.263 — ABNORMAL LOW (ref 7.350–7.450)
pO2, Arterial: 61.7 mmHg — ABNORMAL LOW (ref 83.0–108.0)
pO2, Arterial: 136 mmHg — ABNORMAL HIGH (ref 83.0–108.0)
pO2, Arterial: 65.7 mmHg — ABNORMAL LOW (ref 83.0–108.0)

## 2018-09-15 LAB — GLUCOSE, CAPILLARY
Glucose-Capillary: 254 mg/dL — ABNORMAL HIGH (ref 70–99)
Glucose-Capillary: 158 mg/dL — ABNORMAL HIGH (ref 70–99)
Glucose-Capillary: 115 mg/dL — ABNORMAL HIGH (ref 70–99)
Glucose-Capillary: 129 mg/dL — ABNORMAL HIGH (ref 70–99)
Glucose-Capillary: 171 mg/dL — ABNORMAL HIGH (ref 70–99)

## 2018-09-15 LAB — CBC WITH DIFFERENTIAL/PLATELET
HCT: 24.8 % — ABNORMAL LOW (ref 39.0–52.0)
Hemoglobin: 7.3 g/dL — ABNORMAL LOW (ref 13.0–17.0)
MCH: 26.4 pg (ref 26.0–34.0)
MCHC: 29.4 g/dL — ABNORMAL LOW (ref 30.0–36.0)
MCV: 89.9 fL (ref 80.0–100.0)
Platelets: 60 10*3/uL — ABNORMAL LOW (ref 150–400)
RBC: 2.76 MIL/uL — ABNORMAL LOW (ref 4.22–5.81)
RDW: 15.5 % (ref 11.5–15.5)
WBC: 0.1 10*3/uL — AB (ref 4.0–10.5)
nRBC: 0 % (ref 0.0–0.2)

## 2018-09-15 LAB — BRAIN NATRIURETIC PEPTIDE: B Natriuretic Peptide: 693.2 pg/mL — ABNORMAL HIGH (ref 0.0–100.0)

## 2018-09-15 LAB — MAGNESIUM
MAGNESIUM: 2.1 mg/dL (ref 1.7–2.4)
Magnesium: 1.8 mg/dL (ref 1.7–2.4)

## 2018-09-15 LAB — COMPREHENSIVE METABOLIC PANEL
ALT: 12 U/L (ref 0–44)
AST: 13 U/L — ABNORMAL LOW (ref 15–41)
Albumin: 2.9 g/dL — ABNORMAL LOW (ref 3.5–5.0)
Alkaline Phosphatase: 75 U/L (ref 38–126)
Anion gap: 11 (ref 5–15)
BUN: 53 mg/dL — ABNORMAL HIGH (ref 6–20)
CO2: 20 mmol/L — ABNORMAL LOW (ref 22–32)
Calcium: 6.9 mg/dL — ABNORMAL LOW (ref 8.9–10.3)
Chloride: 102 mmol/L (ref 98–111)
Creatinine, Ser: 1.75 mg/dL — ABNORMAL HIGH (ref 0.61–1.24)
GFR calc Af Amer: 48 mL/min — ABNORMAL LOW (ref >60)
GFR, EST NON AFRICAN AMERICAN: 42 mL/min — AB (ref >60)
Glucose, Bld: 149 mg/dL — ABNORMAL HIGH (ref 70–99)
POTASSIUM: 4.9 mmol/L (ref 3.5–5.1)
Sodium: 133 mmol/L — ABNORMAL LOW (ref 135–145)
Total Bilirubin: 2.4 mg/dL — ABNORMAL HIGH (ref 0.3–1.2)
Total Protein: 5.3 g/dL — ABNORMAL LOW (ref 6.5–8.1)

## 2018-09-15 LAB — PROCALCITONIN: Procalcitonin: 6.31 ng/mL

## 2018-09-15 LAB — PHOSPHORUS
Phosphorus: 3.8 mg/dL (ref 2.5–4.6)
Phosphorus: 3.2 mg/dL (ref 2.5–4.6)

## 2018-09-15 LAB — CORTISOL: Cortisol, Plasma: 72.4 ug/dL

## 2018-09-15 MED ORDER — OSMOLITE 1.5 CAL PO LIQD
1000.0000 mL | ORAL | Status: DC
  Administered 2018-09-15: 1000 mL
  Filled 2018-09-15 (×2): qty 1000

## 2018-09-15 MED ORDER — TBO-FILGRASTIM 300 MCG/0.5ML ~~LOC~~ SOSY
300.0000 ug | PREFILLED_SYRINGE | Freq: Once | SUBCUTANEOUS | Status: AC
  Administered 2018-09-15: 300 ug via SUBCUTANEOUS
  Filled 2018-09-15: qty 0.5

## 2018-09-15 MED ORDER — AMIODARONE IV BOLUS ONLY 150 MG/100ML
150.0000 mg | Freq: Once | INTRAVENOUS | Status: AC
  Administered 2018-09-15: 150 mg via INTRAVENOUS

## 2018-09-15 MED ORDER — PROPOFOL 10 MG/ML IV BOLUS
INTRAVENOUS | Status: DC | PRN
  Administered 2018-09-15: 60 mg via INTRAVENOUS

## 2018-09-15 MED ORDER — SUCCINYLCHOLINE CHLORIDE 20 MG/ML IJ SOLN: INTRAMUSCULAR | Status: DC | PRN

## 2018-09-15 MED ORDER — ORAL CARE MOUTH RINSE
15.0000 mL | OROMUCOSAL | Status: DC
  Administered 2018-09-15 – 2018-09-16 (×11): 15 mL via OROMUCOSAL

## 2018-09-15 MED ORDER — MIDAZOLAM HCL 2 MG/2ML IJ SOLN
2.0000 mg | Freq: Once | INTRAMUSCULAR | Status: AC
  Administered 2018-09-15: 2 mg via INTRAVENOUS

## 2018-09-15 MED ORDER — PROPOFOL 10 MG/ML IV BOLUS: INTRAVENOUS | Status: DC | PRN

## 2018-09-15 MED ORDER — NOREPINEPHRINE 16 MG/250ML-% IV SOLN
0.0000 ug/min | INTRAVENOUS | Status: DC
  Administered 2018-09-15: 2 ug/min via INTRAVENOUS
  Administered 2018-09-15: 17 ug/min via INTRAVENOUS
  Filled 2018-09-15 (×3): qty 250

## 2018-09-15 MED ORDER — DILTIAZEM HCL-DEXTROSE 100-5 MG/100ML-% IV SOLN (PREMIX)
5.0000 mg/h | INTRAVENOUS | Status: DC
  Administered 2018-09-15: 5 mg/h via INTRAVENOUS
  Administered 2018-09-15: 15 mg/h via INTRAVENOUS
  Filled 2018-09-15: qty 100

## 2018-09-15 MED ORDER — ALBUMIN HUMAN 5 % IV SOLN
12.5000 g | Freq: Once | INTRAVENOUS | Status: AC
  Administered 2018-09-15: 12.5 g via INTRAVENOUS
  Filled 2018-09-15: qty 250

## 2018-09-15 MED ORDER — INSULIN ASPART 100 UNIT/ML ~~LOC~~ SOLN
2.0000 [IU] | SUBCUTANEOUS | Status: DC
  Administered 2018-09-15: 6 [IU] via SUBCUTANEOUS
  Administered 2018-09-15: 2 [IU] via SUBCUTANEOUS
  Administered 2018-09-15 (×2): 4 [IU] via SUBCUTANEOUS

## 2018-09-15 MED ORDER — ORAL CARE MOUTH RINSE: 15.0000 mL | OROMUCOSAL | Status: DC

## 2018-09-15 MED ORDER — SODIUM CHLORIDE 0.9 % IV SOLN
2.0000 g | Freq: Three times a day (TID) | INTRAVENOUS | Status: DC
  Administered 2018-09-15 – 2018-09-16 (×5): 2 g via INTRAVENOUS
  Filled 2018-09-15 (×6): qty 2

## 2018-09-15 MED ORDER — VANCOMYCIN HCL 10 G IV SOLR
2000.0000 mg | Freq: Once | INTRAVENOUS | Status: AC
  Administered 2018-09-15: 2000 mg via INTRAVENOUS
  Filled 2018-09-15: qty 2000

## 2018-09-15 MED ORDER — FENTANYL CITRATE (PF) 100 MCG/2ML IJ SOLN
100.0000 ug | INTRAMUSCULAR | Status: DC | PRN
  Administered 2018-09-15 (×4): 100 ug via INTRAVENOUS
  Filled 2018-09-15 (×4): qty 2

## 2018-09-15 MED ORDER — ALBUMIN HUMAN 25 % IV SOLN
50.0000 g | Freq: Once | INTRAVENOUS | Status: AC
  Administered 2018-09-15: 50 g via INTRAVENOUS
  Filled 2018-09-15: qty 200

## 2018-09-15 MED ORDER — AMIODARONE HCL IN DEXTROSE 360-4.14 MG/200ML-% IV SOLN
30.0000 mg/h | INTRAVENOUS | Status: DC
  Administered 2018-09-15 – 2018-09-16 (×2): 30 mg/h via INTRAVENOUS
  Filled 2018-09-15: qty 200

## 2018-09-15 MED ORDER — PANTOPRAZOLE SODIUM 40 MG IV SOLR
40.0000 mg | Freq: Every day | INTRAVENOUS | Status: DC
  Administered 2018-09-15: 40 mg via INTRAVENOUS
  Filled 2018-09-15: qty 40

## 2018-09-15 MED ORDER — AMIODARONE HCL IN DEXTROSE 360-4.14 MG/200ML-% IV SOLN
60.0000 mg/h | INTRAVENOUS | Status: AC
  Administered 2018-09-15: 60 mg/h via INTRAVENOUS
  Filled 2018-09-15 (×2): qty 200

## 2018-09-15 MED ORDER — FENTANYL BOLUS VIA INFUSION
50.0000 ug | INTRAVENOUS | Status: DC | PRN
  Filled 2018-09-15: qty 50

## 2018-09-15 MED ORDER — VANCOMYCIN HCL 10 G IV SOLR: 1500.0000 mg | INTRAVENOUS | Status: DC

## 2018-09-15 MED ORDER — VASOPRESSIN 20 UNIT/ML IV SOLN
0.0300 [IU]/min | INTRAVENOUS | Status: DC
  Administered 2018-09-15: 0.03 [IU]/min via INTRAVENOUS
  Filled 2018-09-15 (×3): qty 2

## 2018-09-15 MED ORDER — ETOMIDATE 2 MG/ML IV SOLN: 20.0000 mg | Freq: Once | INTRAVENOUS | Status: DC

## 2018-09-15 MED ORDER — PHENYLEPHRINE HCL-NACL 10-0.9 MG/250ML-% IV SOLN
0.0000 ug/min | INTRAVENOUS | Status: DC
  Administered 2018-09-15: 25 ug/min via INTRAVENOUS
  Filled 2018-09-15: qty 500

## 2018-09-15 MED ORDER — FENTANYL CITRATE (PF) 100 MCG/2ML IJ SOLN
INTRAMUSCULAR | Status: AC
  Administered 2018-09-15: 100 ug
  Filled 2018-09-15: qty 2

## 2018-09-15 MED ORDER — SODIUM CHLORIDE 0.9 % IV SOLN: INTRAVENOUS | Status: DC | PRN

## 2018-09-15 MED ORDER — AMIODARONE IV BOLUS ONLY 150 MG/100ML
150.0000 mg | Freq: Once | INTRAVENOUS | Status: AC
  Administered 2018-09-15: 150 mg via INTRAVENOUS
  Filled 2018-09-15: qty 100

## 2018-09-15 MED ORDER — FLUCONAZOLE 100 MG PO TABS
100.0000 mg | ORAL_TABLET | Freq: Every day | ORAL | Status: DC
  Administered 2018-09-15: 100 mg
  Filled 2018-09-15: qty 1

## 2018-09-15 MED ORDER — PRO-STAT SUGAR FREE PO LIQD
30.0000 mL | Freq: Two times a day (BID) | ORAL | Status: DC
  Administered 2018-09-15 (×3): 30 mL
  Filled 2018-09-15 (×3): qty 30

## 2018-09-15 MED ORDER — DOCUSATE SODIUM 50 MG/5ML PO LIQD: 100.0000 mg | Freq: Two times a day (BID) | ORAL | Status: DC | PRN

## 2018-09-15 MED ORDER — LACTATED RINGERS IV SOLN
INTRAVENOUS | Status: DC
  Administered 2018-09-15: 04:00:00 via INTRAVENOUS

## 2018-09-15 MED ORDER — MAGNESIUM SULFATE 2 GM/50ML IV SOLN
2.0000 g | Freq: Once | INTRAVENOUS | Status: AC
  Administered 2018-09-15: 2 g via INTRAVENOUS
  Filled 2018-09-15: qty 50

## 2018-09-15 MED ORDER — AMIODARONE LOAD VIA INFUSION
150.0000 mg | Freq: Once | INTRAVENOUS | Status: AC
  Administered 2018-09-15: 150 mg via INTRAVENOUS
  Filled 2018-09-15: qty 83.34

## 2018-09-15 MED ORDER — CHLORHEXIDINE GLUCONATE 0.12% ORAL RINSE (MEDLINE KIT)
15.0000 mL | Freq: Two times a day (BID) | OROMUCOSAL | Status: DC
  Administered 2018-09-15 – 2018-09-16 (×3): 15 mL via OROMUCOSAL

## 2018-09-15 MED ORDER — LACTATED RINGERS IV SOLN: INTRAVENOUS | Status: DC

## 2018-09-15 MED ORDER — VITAL HIGH PROTEIN PO LIQD
1000.0000 mL | ORAL | Status: DC
  Administered 2018-09-15: 1000 mL

## 2018-09-15 MED ORDER — PROPOFOL 1000 MG/100ML IV EMUL: 5.0000 ug/kg/min | INTRAVENOUS | Status: DC

## 2018-09-15 MED ORDER — HYDROCORTISONE NA SUCCINATE PF 100 MG IJ SOLR
50.0000 mg | Freq: Four times a day (QID) | INTRAMUSCULAR | Status: DC
  Administered 2018-09-15 – 2018-09-16 (×4): 50 mg via INTRAVENOUS
  Filled 2018-09-15 (×3): qty 2

## 2018-09-15 MED ORDER — FENTANYL CITRATE (PF) 100 MCG/2ML IJ SOLN
50.0000 ug | Freq: Once | INTRAMUSCULAR | Status: AC
  Administered 2018-09-15: 50 ug via INTRAVENOUS

## 2018-09-15 MED ORDER — AMIODARONE HCL IN DEXTROSE 360-4.14 MG/200ML-% IV SOLN
60.0000 mg/h | INTRAVENOUS | Status: DC
  Administered 2018-09-15: 60 mg/h via INTRAVENOUS
  Filled 2018-09-15: qty 200

## 2018-09-15 MED ORDER — SUCCINYLCHOLINE CHLORIDE 20 MG/ML IJ SOLN
INTRAMUSCULAR | Status: DC | PRN
  Administered 2018-09-15: 120 mg via INTRAVENOUS

## 2018-09-15 MED ORDER — METOPROLOL TARTRATE 5 MG/5ML IV SOLN
5.0000 mg | Freq: Once | INTRAVENOUS | Status: AC
  Administered 2018-09-15: 5 mg via INTRAVENOUS
  Filled 2018-09-15: qty 5

## 2018-09-15 MED ORDER — MIDAZOLAM HCL 2 MG/2ML IJ SOLN
2.0000 mg | INTRAMUSCULAR | Status: DC | PRN
  Administered 2018-09-15 (×2): 2 mg via INTRAVENOUS
  Filled 2018-09-15 (×4): qty 2

## 2018-09-15 MED ORDER — PHENYLEPHRINE 40 MCG/ML (10ML) SYRINGE FOR IV PUSH (FOR BLOOD PRESSURE SUPPORT)
PREFILLED_SYRINGE | INTRAVENOUS | Status: DC | PRN
  Administered 2018-09-15: 120 ug via INTRAVENOUS

## 2018-09-15 MED ORDER — BISACODYL 10 MG RE SUPP: 10.0000 mg | Freq: Every day | RECTAL | Status: DC | PRN

## 2018-09-15 MED ORDER — IPRATROPIUM-ALBUTEROL 0.5-2.5 (3) MG/3ML IN SOLN
3.0000 mL | RESPIRATORY_TRACT | Status: DC
  Administered 2018-09-15 (×3): 3 mL via RESPIRATORY_TRACT
  Filled 2018-09-15 (×3): qty 3

## 2018-09-15 MED ORDER — AMIODARONE HCL IN DEXTROSE 360-4.14 MG/200ML-% IV SOLN: 30.0000 mg/h | INTRAVENOUS | Status: DC

## 2018-09-15 MED ORDER — PHENYLEPHRINE HCL-NACL 10-0.9 MG/250ML-% IV SOLN
25.0000 ug/min | INTRAVENOUS | Status: DC
  Administered 2018-09-15: 100 ug/min via INTRAVENOUS
  Administered 2018-09-15: 300 ug/min via INTRAVENOUS
  Administered 2018-09-15 (×2): 200 ug/min via INTRAVENOUS
  Administered 2018-09-15: 150 ug/min via INTRAVENOUS
  Administered 2018-09-15: 200 ug/min via INTRAVENOUS
  Administered 2018-09-15: 300 ug/min via INTRAVENOUS
  Administered 2018-09-15: 150 ug/min via INTRAVENOUS
  Administered 2018-09-16: 25 ug/min via INTRAVENOUS
  Filled 2018-09-15: qty 500
  Filled 2018-09-15: qty 250
  Filled 2018-09-15 (×3): qty 500
  Filled 2018-09-15: qty 250

## 2018-09-15 MED ORDER — CHLORHEXIDINE GLUCONATE 0.12% ORAL RINSE (MEDLINE KIT)
15.0000 mL | Freq: Two times a day (BID) | OROMUCOSAL | Status: DC
  Administered 2018-09-15: 15 mL via OROMUCOSAL

## 2018-09-15 MED ORDER — VANCOMYCIN HCL 10 G IV SOLR: 2000.0000 mg | INTRAVENOUS | Status: DC

## 2018-09-15 MED ORDER — FENTANYL CITRATE (PF) 100 MCG/2ML IJ SOLN
100.0000 ug | Freq: Once | INTRAMUSCULAR | Status: DC
  Filled 2018-09-15: qty 2

## 2018-09-15 MED ORDER — MIDAZOLAM HCL 2 MG/2ML IJ SOLN
INTRAMUSCULAR | Status: AC
  Filled 2018-09-15: qty 2

## 2018-09-15 MED ORDER — FENTANYL 2500MCG IN NS 250ML (10MCG/ML) PREMIX INFUSION
25.0000 ug/h | INTRAVENOUS | Status: DC
  Administered 2018-09-15: 25 ug/h via INTRAVENOUS
  Filled 2018-09-15: qty 250

## 2018-09-15 MED ORDER — SODIUM CHLORIDE 0.9 % IV SOLN
250.0000 mL | INTRAVENOUS | Status: DC
  Administered 2018-09-15: 250 mL via INTRAVENOUS

## 2018-09-15 NOTE — Progress Notes (Signed)
[facility] Progress Note Patient Name: Dylan Jacobs DOB: [birth year] MRN: [id number]   Date of Service  09/15/2018  HPI/Events of Note  ABG on 100%/PRVC 16/TV 560/P 5  = 7.173/64.7/61.7  eICU Interventions  Will order: 1. Increase PRVC rate to 24 and increase PEEP to 8. 2. ABG at 5 AM.      Intervention Category Major Interventions: Respiratory failure - evaluation and management;Acid-Base disturbance - evaluation and management  Kidada Ging [place] 09/15/2018, 3:32 AM

## 2018-09-15 NOTE — Progress Notes (Addendum)
Pharmacy Antibiotic Note  Dylan Jacobs is a 58 y.o. male admitted on 08/31/2018 with pneumonia and febrile neutropenia.  Pharmacy has been consulted for Vancomycin, Cefepime dosing.  Plan: Vancomycin 2gm x1, then 2gm q36 hr Vancomycin 1500 mg IV Q 24 hrs. Goal AUC 400-550. Expected AUC: 503, calc Cmin 9.7 SCr used: 1.75  Cefepime 2gm q8hr  Daily SCr from 1/30   Height: 5\' 9"  (175.3 cm) Weight: 192 lb 10.9 oz (87.4 kg) IBW/kg (Calculated) : 70.7  Temp (24hrs), Avg:98 F (36.7 C), Min:97.1 F (36.2 C), Max:99 F (37.2 C)  Recent Labs  Lab 09/09/2018 1029 08/23/2018 1152 09/17/2018 1625 09/01/2018 2039 09/15/18 0747  WBC SPECIMEN CLOTTED 0.4* 0.3*  --  0.1*  CREATININE 1.50*  --   --   --  1.75*  LATICACIDVEN  --  2.3* 2.4* 1.7  --     Estimated Creatinine Clearance: 49.8 mL/min (A) (by C-G formula based on SCr of 1.75 mg/dL (H)).    Allergies  Allergen Reactions  . Solu-Medrol [Methylprednisolone] Shortness Of Breath   Antimicrobials this admission: Vancomycin 09/15/2018 >> Cefepime 09/15/2018 >>   Dose adjustments this admission: Vanc 1500mg  q24 >> AUC 478, SCr used: 1.5 SCr incr to 1.75 >> 2gm q36  Microbiology results: 1/28 BCx: sent 1/28 Trach asp: Abundant GPC, few GVR  Previous admit 1/16 MRSA PCR: neg 1/5 Sputum: Normal Flora-final   Thank you for allowing pharmacy to be a part of this patient's care.  Minda Ditto PharmD Pager [phone] 09/15/2018, 12:20 PM

## 2018-09-15 NOTE — Anesthesia Procedure Notes (Signed)
Procedure Name: Intubation Performed by: Lillia Abed, MD Pre-anesthesia Checklist: Patient identified, Emergency Drugs available, Suction available and Patient being monitored Preoxygenation: Pre-oxygenation with 100% oxygen Induction Type: IV induction, Rapid sequence and Cricoid Pressure applied Laryngoscope Size: Mac and 3 Grade View: Grade I Tube type: Subglottic suction tube Tube size: 8.0 mm Number of attempts: 1 Placement Confirmation: CO2 detector and breath sounds checked- equal and bilateral Secured at: 23 cm Tube secured with: Tape Comments: Intubation performed by Miguel Rota CRNA under my supervision.

## 2018-09-15 NOTE — Progress Notes (Signed)
eLink Physician-Brief Progress Note Patient Name: NIZAR CUTLER DOB: Feb 25, 1961 MRN: [id number]   Date of Service  09/15/2018  HPI/Events of Note  Now intubated and ventilated. Request for ventilator and sedation orders.   eICU Interventions  Will order: 1. Mechanical ventilation: 100%/PRVC 16/TV 8 cc/kg/P 5.  2. Propofol IV infusion. Titrate to RASS = 0 to -1.  3. ABG and CXR pending.      Intervention Category Major Interventions: Respiratory failure - evaluation and management  Shantanique Hodo Eugene 09/15/2018, 1:48 AM

## 2018-09-15 NOTE — Consult Note (Addendum)
NAME:  Dylan Jacobs, MRN:  [id number], DOB:  1960-10-26, LOS: 1 ADMISSION DATE:  08/25/2018, CONSULTATION DATE:  09/15/2018 REFERRING MD:  Clance Boll, [facility], CHIEF COMPLAINT:  Short of breath   Brief History   58 yo with male smoker with progressive dyspnea, a fib.  He has hx of Stage IIIa NSCLC s/p chemo (carboplatin, paclitaxel) 7 days prior to admission.  Had neutropenia.  Developed progressive respiratory failure and required intubation.  Past Medical History  PUD, CVA, HTN, DM  Significant Hospital Events   1/27 Admit 1/28 VDRF  Consults:    Procedures:  ETT 1/28 >>   Significant Diagnostic Tests:    Micro Data:  Blood 1/27 >> Sputum 1/28 >>  Antimicrobials:  Cefepime 1/28 >> Vancomycin 1/28 >>  Interim history/subjective:    Objective   Blood pressure (!) 92/48, pulse (!) 123, temperature (!) 97.4 F (36.3 C), temperature source Axillary, resp. rate (!) 21, height 5\' 9"  (1.753 m), weight 90 kg, SpO2 93 %.    FiO2 (%):  [40 %-55 %] 55 %   Intake/Output Summary (Last 24 hours) at 09/15/2018 0154 Last data filed at 09/15/2018 0045 Gross per 24 hour  Intake 3770.65 ml  Output 425 ml  Net 3345.65 ml   Filed Weights   09/11/2018 1250  Weight: 90 kg    Examination:  General - cachectic Eyes - pupils reactive ENT - ETT in place Cardiac - irregular, no murmur Chest - b/l crackles Abdomen - soft, non tender, decreased bowel sounds GU - no lesions noted Extremities - 2+ edema Skin - no rashes Neuro - sedated/paralyzed after intubation  CXR (reviewed by me) >> b/l ASD, LLL mass     Resolved Hospital Problem list     Assessment & Plan:   Acute respiratory failure with hypoxia. Plan - full vent support - f/u CXR, ABG  Septic shock with PNA. Plan - pressors to keep MAP > 65 - continue IV fluids - cefepime vancomycin - check cortisol level - f/u procalcitonin  Stage IIIA NSCLC s/p chemoradiation. Chemo induced pancytopenia. Plan -  granix - f/u CBC  Severe protein calorie malnutrition. Plan - tube feeds  A fib with RVR. Discussion: Tachycardia likely from sympathetic drive in setting of respiratory failure. Plan - d/c amiodarone - monitor heart rhythm - hold xarelto for now >> last dose in PM of 1/27  Hyperkalemia, hypomagnesemia. Plan - f/u electrolytes  DM with hyperglycemia. Plan - SSI  Acute metabolic encephalopathy. Plan - RASS goal 0 to -1  Best practice:  Diet: tube feeds DVT prophylaxis: SCDs GI prophylaxis: Protonix Mobility: bed rest Code Status: full code Family Communication: no family at bedside Disposition: ICU  Labs   CBC: Recent Labs  Lab 08/24/2018 1029 09/18/2018 1152 08/23/2018 1625  WBC SPECIMEN CLOTTED 0.4* 0.3*  NEUTROABS SPECIMEN CLOTTED 0.3*  --   HGB SPECIMEN CLOTTED 8.8* 8.6*  HCT SPECIMEN CLOTTED 27.9* 28.0*  MCV SPECIMEN CLOTTED 85.6 84.6  PLT SPECIMEN CLOTTED 61* 62*    Basic Metabolic Panel: Recent Labs  Lab 09/10/2018 1029 08/22/2018 1625  NA 130*  --   K 5.6* 5.3*  CL 95*  --   CO2 23  --   GLUCOSE 432*  --   BUN 52*  --   CREATININE 1.50*  --   CALCIUM 7.5*  --   MG  --  1.4*  PHOS  --  3.4   GFR: Estimated Creatinine Clearance: 58.8 mL/min (A) (by C-G formula  based on SCr of 1.5 mg/dL (H)). Recent Labs  Lab 08/23/2018 1029 08/19/2018 1152 09/09/2018 1625 09/04/2018 2039  WBC SPECIMEN CLOTTED 0.4* 0.3*  --   LATICACIDVEN  --  2.3* 2.4* 1.7    Liver Function Tests: Recent Labs  Lab 09/09/2018 1029 09/01/2018 1625  AST 17 10*  ALT 19 17  ALKPHOS 147* 120  BILITOT 2.0* 1.7*  PROT 5.9* 5.0*  ALBUMIN 2.4* 1.9*   No results for input(s): LIPASE, AMYLASE in the last 168 hours. No results for input(s): AMMONIA in the last 168 hours.  ABG    Component Value Date/Time   TCO2 28 07/10/2018 1320     Coagulation Profile: Recent Labs  Lab 08/24/2018 1029  INR 1.15    Cardiac Enzymes: No results for input(s): CKTOTAL, CKMB, CKMBINDEX,  TROPONINI in the last 168 hours.  HbA1C: Hemoglobin A1C  Date/Time Value Ref Range Status  05/30/2014 11:24 AM 10.1  Final  06/16/2013 03:43 PM 98  Final   Hgb A1c MFr Bld  Date/Time Value Ref Range Status  08/24/2018 04:39 AM 8.0 (H) 4.8 - 5.6 % Final    Comment:    (NOTE) Pre diabetes:          5.7%-6.4% Diabetes:              >6.4% Glycemic control for   <7.0% adults with diabetes   07/30/2007 06:30 PM (H)  Final   9.8 (NOTE)   The ADA recommends the following therapeutic goals for glycemic   control related to Hgb A1C measurement:   Goal of Therapy:   < 7.0% Hgb A1C   Action Suggested:  > 8.0% Hgb A1C   Ref:  Diabetes Care, 22, Suppl. 1, 1999    CBG: Recent Labs  Lab 09/03/2018 1020 08/30/2018 2153  GLUCAP 376* 368*    Review of Systems:   Unable to obtain  Past Medical History  He,  has a past medical history of Cancer ([place]), Diabetes mellitus without complication ([place]), Fractured rib, Hypertension, Stroke ([place]), and Ulcer.   Surgical History    Past Surgical History:  Procedure Laterality Date  . NO PAST SURGERIES       Social History   reports that he has been smoking cigarettes. He has a 60.00 pack-year smoking history. He has never used smokeless tobacco. He reports current alcohol use. He reports current drug use. Drug: Marijuana.   Family History   His family history includes ALS in his brother; COPD in his brother; Cancer in his brother; Prostate cancer in his father.   Allergies Allergies  Allergen Reactions  . Solu-Medrol [Methylprednisolone] Shortness Of Breath     Home Medications  Prior to Admission medications   Medication Sig Start Date End Date Taking? Authorizing Provider  ALPRAZolam Duanne Moron) 0.5 MG tablet Take 1 tablet (0.5 mg total) by mouth 3 (three) times daily as needed for anxiety. 08/31/18  Yes Causey, Charlestine Massed, NP  amLODipine (NORVASC) 5 MG tablet Take 1 tablet (5 mg total) by mouth daily. 08/26/18  Yes Purohit, Konrad Dolores, MD    fluconazole (DIFLUCAN) 100 MG tablet Take 1 tablet (100 mg total) by mouth daily. 09/02/18  Yes Gery Pray, MD  hydrochlorothiazide (HYDRODIURIL) 25 MG tablet Take 1 tablet (25 mg total) by mouth daily. 08/26/18  Yes Purohit, Konrad Dolores, MD  Menthol (HALLS COUGH DROPS) 5.8 MG LOZG Use as directed 1 lozenge in the mouth or throat daily as needed (cough).   Yes [provider]  Menthol (RICOLA) LOZG Use as directed 1 lozenge in the mouth or throat daily as needed (cough).   Yes [provider]  oxyCODONE-acetaminophen (PERCOCET) 7.5-325 MG tablet Take 1 tablet by mouth every 4 (four) hours as needed for severe pain. 08/22/18  Yes Gery Pray, MD  potassium chloride SA (K-DUR,KLOR-CON) 20 MEQ tablet Take 1 tablet (20 mEq total) by mouth 2 (two) times daily. Patient taking differently: Take 40 mEq by mouth daily.  08/31/18  Yes Causey, Charlestine Massed, NP  prochlorperazine (COMPAZINE) 10 MG tablet TAKE 1 TABLET BY MOUTH EVERY 6 HOURS AS NEEDED FOR NAUSEA OR VOMITING Patient taking differently: Take 10 mg by mouth 2 (two) times daily as needed for nausea or vomiting.  09/09/18  Yes Curt Bears, MD  sitaGLIPtin-metformin (JANUMET) 50-1000 MG tablet Take 1 tablet by mouth 2 (two) times daily with a meal. 08/25/18 09/24/18 Yes Purohit, Konrad Dolores, MD  methylPREDNISolone (MEDROL) 4 MG tablet Take 1 tablet (4 mg total) by mouth daily. Taper 6,5,4,3,2,1 Patient not taking: Reported on 09/18/2018 08/31/18   Gardenia Phlegm, NP  prochlorperazine (COMPAZINE) 10 MG tablet Take 1 tablet (10 mg total) by mouth every 6 (six) hours as needed for nausea or vomiting. Patient not taking: Reported on 09/04/2018 09/09/18   Curt Bears, MD     Critical care time: 63 minutes    Chesley Mires, MD [place] 09/15/2018, 2:18 AM

## 2018-09-15 NOTE — ED Provider Notes (Signed)
Called to the floor due to patient being found without a blood pressure.  Patient has been on multiple vasoactive drips.  Upon my arrival patient was in a tachyarrhythmia at about 160.  He had had 2 rounds of CPR including 2 rounds of epinephrine.  Compressions were held and patient had a good pulse.  Patient already had airway prior to my arrival.  Discussed with critical care doctor who is monitoring via video.  EKG performed and shows patient to be in A. fib with RVR.  Chest x-ray is pending.  Care turned over to intensivist at 11:45 PM   Lacretia Leigh, MD 09/15/18 2348

## 2018-09-15 NOTE — Progress Notes (Signed)
RT attempted to place Al[facility] without success; PCCM NP made aware.

## 2018-09-15 NOTE — Progress Notes (Addendum)
NAME:  Dylan Jacobs, MRN:  [id number], DOB:  Feb 16, 1961, LOS: 1 ADMISSION DATE:  08/30/2018, CONSULTATION DATE:  09/15/2018 REFERRING MD:  Clance Boll, [facility], CHIEF COMPLAINT:  Short of breath   Brief History   58 yo with male smoker with progressive dyspnea, a fib.  He has hx of Stage IIIa NSCLC s/p chemo (carboplatin, paclitaxel) 7 days prior to admission.  Had neutropenia.  Developed progressive respiratory failure and required intubation.  Past Medical History  PUD, CVA, HTN, DM  Significant Hospital Events   1/27 Admit 1/28 VDRF, escalating pressor needs, worsening renal function.  Central access being placed, antibiotics continued.  Consults:    Procedures:  ETT 1/28 >>   Significant Diagnostic Tests:    Micro Data:  Blood 1/27 >> Sputum 1/28 >>  Antimicrobials:  Cefepime 1/28 >> Vancomycin 1/28 >>  Interim history/subjective:  Increased vasoactive drip requirements overnight  Objective   Blood pressure (Abnormal) 70/52, pulse (Abnormal) 132, temperature 99 F (37.2 C), temperature source Axillary, resp. rate (Abnormal) 44, height 5\' 9"  (1.753 m), weight 87.4 kg, SpO2 (Significant) 97 %.    Vent Mode: PRVC FiO2 (%):  [40 %-100 %] 60 % Set Rate:  [16 bmp-30 bmp] 30 bmp Vt Set:  [560 mL] 560 mL PEEP:  [5 cmH20-8 cmH20] 8 cmH20 Plateau Pressure:  [21 cmH20-27 cmH20] 23 cmH20   Intake/Output Summary (Last 24 hours) at 09/15/2018 0941 Last data filed at 09/15/2018 0600 Gross per 24 hour  Intake 6035.65 ml  Output 515 ml  Net 5520.65 ml   Filed Weights   09/18/2018 1250 09/15/18 0500  Weight: 90 kg 87.4 kg    Examination:  General: This is a chronically ill-appearing white male is currently on full ventilator support HEENT normocephalic atraumatic orally intubated Pulmonary: Diffuse rhonchi scattered throughout Cardiac: Tachycardic atrial fibrillation with RVR Abdomen: Soft nontender, positive bowel sounds GU: Clear yellow Neuro:  Sedated Extremities: Dependent edema, scattered areas of ecchymosis  Resolved Hospital Problem list     Assessment & Plan:   Acute on chronic respiratory failure with hypoxia 2/2 PNA superimposed on COPD and lung cancer. Portable chest x-ray personally reviewed: Endotracheal tubes in satisfactory position there is bilateral airspace disease some acute on chronic changes on the left that appears to be where his cancer is.  There is also new right-sided airspace disease Plan Full ventilator support PAD protocol Scheduled BDs VAP bundle  Wean FIO2 and peep as able  Am cxr  Septic shock with PNA. Plan Continue vasoactive drips for mean arterial pressure greater than 65 Stress dose steroids Transduce central venous pressure after central line placed Day #2 vancomycin and cefepime  Stage IIIA NSCLC s/p chemoradiation. Chemo induced pancytopenia. Plan Continue Granix Follow-up CBC  Severe protein calorie malnutrition. Plan Continue tube feeds  A fib with RVR. Discussion: Tachycardia likely from sympathetic drive in setting of respiratory failure. Plan Amiodarone drip Continue telemetry monitoring Evaluate central venous pressure Holding systemic AC d/t thrombocytopenia   Acute kidney injury in setting of sepsis/septic shock Plan Transducing CVP Renal dose medications Strict intake output Repeat a.m. chemistry Mean arterial pressure goal greater than 65  Fluid and electrolyte imbalance:Her medications were hyponatremia stable. Plan Continue IV fluid replacement Am chemistry   DM with hyperglycemia. Plan ssi   Acute metabolic encephalopathy. Plan RASS goal -1  Best practice:  Diet: tube feeds DVT prophylaxis: SCDs GI prophylaxis: Protonix Mobility: bed rest Code Status: full code Family Communication: no family at bedside Disposition: He  remains critically ill due to refractory septic shock requiring escalation of vasoactive drips and close titration.   He remains ventilator dependent, and has multiple metabolic derangements all requiring critical care support   Critical care time: 38 minutes     09/15/2018, 9:41 AM

## 2018-09-15 NOTE — Progress Notes (Signed)
[facility] Progress Note Patient Name: Dylan Jacobs DOB: February 19, 1961 MRN: [id number]   Date of Service  09/15/2018  HPI/Events of Note  ABG on 70%/PRVC 24/TV 560/P 8 = 7.228/53.9/136  eICU Interventions  Will order: 1. Increase PRVC rate to 30. 2. ABG at 7:15 AM.     Intervention Category Major Interventions: Acid-Base disturbance - evaluation and management;Respiratory failure - evaluation and management  Ryne Mctigue Cornelia Copa 09/15/2018, 5:53 AM

## 2018-09-15 NOTE — Consult Note (Signed)
[facility] Nurse wound consult note Patient remains too unstable for assessment of intragluteal space.  Critical Care RN requests a revisit attempt in approximately an hour.  Thanks, Maudie Flakes, MSN, RN, [place], Arther Abbott  Pager# [phone]

## 2018-09-15 NOTE — Progress Notes (Signed)
  Amiodarone Drug - Drug Interaction Consult Note  Recommendations: - Digoxin 0.125mg  x1 1/28 given for HR > 120, recommend reducing dose if need to continue for prn use - Diltiazem infusion has been discontinued - Fluconazole has moderate risk to prolong QTc interval, consider length of therapy needed for candidiasis. Prescription written 1/15 for 7 tablets, patient noted last dose as 1/27.   Amiodarone is metabolized by the cytochrome P450 system and therefore has the potential to cause many drug interactions. Amiodarone has an average plasma half-life of 50 days (range 20 to 100 days).   There is potential for drug interactions to occur several weeks or months after stopping treatment and the onset of drug interactions may be slow after initiating amiodarone.   []  Statins: Increased risk of myopathy. Simvastatin- restrict dose to 20mg  daily. Other statins: counsel patients to report any muscle pain or weakness immediately.  []  Anticoagulants: Amiodarone can increase anticoagulant effect. Consider warfarin dose reduction. Patients should be monitored closely and the dose of anticoagulant altered accordingly, remembering that amiodarone levels take several weeks to stabilize.  []  Antiepileptics: Amiodarone can increase plasma concentration of phenytoin, the dose should be reduced. Note that small changes in phenytoin dose can result in large changes in levels. Monitor patient and counsel on signs of toxicity.  []  Beta blockers: increased risk of bradycardia, AV block and myocardial depression. Sotalol - avoid concomitant use.  [x]   Calcium channel blockers (diltiazem and verapamil): increased risk of bradycardia, AV block and myocardial depression.  []   Cyclosporine: Amiodarone increases levels of cyclosporine. Reduced dose of cyclosporine is recommended.  [x]  Digoxin dose should be halved when amiodarone is started.  []  Diuretics: increased risk of cardiotoxicity if hypokalemia  occurs.  []  Oral hypoglycemic agents (glyburide, glipizide, glimepiride): increased risk of hypoglycemia. Patient's glucose levels should be monitored closely when initiating amiodarone therapy.   [x]  Drugs that prolong the QT interval:  Torsades de pointes risk may be increased with concurrent use - avoid if possible.  Monitor QTc, also keep magnesium/potassium WNL if concurrent therapy can't be avoided. Marland Kitchen Antibiotics: e.g. fluoroquinolones, erythromycin. . Antiarrhythmics: e.g. quinidine, procainamide, disopyramide, sotalol. . Antipsychotics: e.g. phenothiazines, haloperidol.  . Lithium, tricyclic antidepressants, and methadone. Thank You,  Minda Ditto  09/15/2018 10:49 AM

## 2018-09-15 NOTE — Care Management Note (Signed)
Case Management Note  Patient Details  Name: JULIANO MCEACHIN MRN: [id number] Date of Birth: Nov 11, 1960  Subjective/Objective:                  Discharge Readiness Return to top of Pneumonia Due to Aspiration RRG - [place]  Discharge readiness is indicated by patient meeting Recovery Milestones, including ALL of the following: ? Hemodynamic stability  TEMP-99.7/ HR 180/RESP 44  ? Oxygen requirement absent or at baseline NO ON FULL VENT SUPPORT ? Aspiration addressed[O] N/A/ ? Infection absent or treatment at next level of care arranged  WBC=0.1/HGB-7.3 ? Ambulatory[P] NO ? Diet or tube feedings tolerated N/A ? iV ALBUMIN, IV LR AT 100CC/HR/IV LEVOPHED, IV NEO,IV VANCOMYCIN   IV SEDATION LEVEL OF CARE=ICU Action/Plan: FOLLOWING FOR PROGRESSION OF CARE.  Expected Discharge Date:  (unknown)               Expected Discharge Plan:     In-House Referral:     Discharge planning Services     Post Acute Care Choice:    Choice offered to:     DME Arranged:    DME Agency:     HH Arranged:    HH Agency:     Status of Service:     If discussed at [employer], dates discussed:    Additional Comments:  Leeroy Cha, RN 09/15/2018, 10:19 AM

## 2018-09-15 NOTE — Progress Notes (Signed)
Initial Nutrition Assessment  DOCUMENTATION CODES:   Non-severe (moderate) malnutrition in context of chronic illness  INTERVENTION:  - Will order TF: 30 ml Prostat BID with Osmolite 1.5 @ 35 ml/hr to advance by 10 ml every 8 hours to reach goal rate of 55 ml/hr. - At goal rate, this regimen will provide 2180 kcal, 113 grams of protein, and 1006 ml free water. - Free water flush, if desired, to be per MD/NP given current IVF and edema.    Monitor magnesium, potassium, and phosphorus daily for at least 3 days, MD to replete as needed, as pt is at risk for refeeding syndrome given reports of poor PO intakes PTA, malnutrition.   NUTRITION DIAGNOSIS:   Moderate Malnutrition related to chronic illness(uncontrolled DM, lung cancer) as evidenced by mild fat depletion, mild muscle depletion.  GOAL:   Patient will meet greater than or equal to 90% of their needs  MONITOR:   Vent status, TF tolerance, Weight trends, Labs, Skin  REASON FOR ASSESSMENT:   Ventilator, Consult Enteral/tube feeding initiation and management  ASSESSMENT:   58 y.o. male with medical history significant of TIA, HTN, Type 2 DM, lung cancer, cataracts, Afib. He presented to the ED for dyspnea. He reported that he left the hospital AMA at last visit. He went home as he was not happy being in the hospital, but returned d/t BLE swelling, DOE, and orthopnea. He had chemotherapy for lung cancer on 1/20 and is now pancytopenic.  BMI indicates overweight status. Patient intubated with OGT in place. His brother was at bedside and reports that patient has had a decreased appetite the past few months. He states that patient's last good meal was 1/25 evening when he ate 50% of a fried fish platter. Brother states that patient was experiencing early satiety and taste alteration (although he was unable to describe alteration other than "bad taste"). Patient would often chew a few bites and have to spit them out d/t taste. Brother  reports that patient does not check CBGs at home and that he had stopped taking medications several years ago because he felt fine.   Per Pete's note this AM: PNA superimposed on COPD and lung cancer, septic shock with PNA, stage 3 NSCLC s/p chemo and radiation, AKI, acute metabolic encephalopathy.   Patient is currently intubated on ventilator support MV: 17.5 L/min Temp (24hrs), Avg:98 F (36.7 C), Min:97.1 F (36.2 C), Max:99 F (37.2 C) Propofol: none BP: 70/46 and MAP: 53  Medications reviewed; 50 mg albumin x1 dose 1/27 and x1 dose 1/28, 50 mg solu-cortef QID, sliding scale novolog, 2 g IV Mg sulfate x1 run 1/27. Labs reviewed; CBGs:254 and 158 mg/dl today, Na: 133 mmol/l, BUN: 53 mg/dl, creatinine: 1.75 mg/dl, Ca: 6.9 mg/dl, GFR: 42 ml/min. IVF; LR @ 100 ml/hr. Drip; levo @ 14 mcg/min.     NUTRITION - FOCUSED PHYSICAL EXAM:    Most Recent Value  Orbital Region  No depletion  Upper Arm Region  Mild depletion  Thoracic and Lumbar Region  Unable to assess  Buccal Region  Mild depletion  Temple Region  Mild depletion  Clavicle Bone Region  Mild depletion  Clavicle and Acromion Bone Region  Mild depletion  Scapular Bone Region  Unable to assess  Dorsal Hand  No depletion  Patellar Region  No depletion  Anterior Thigh Region  No depletion  Posterior Calf Region  No depletion  Edema (RD Assessment)  -- [mild BUE, moderate BLE]  Hair  Reviewed  Eyes  Unable to assess  Mouth  Unable to assess  Skin  Reviewed [appears slightly jaundiced]  Nails  Reviewed       Diet Order:   Diet Order            Diet NPO time specified  Diet effective now              EDUCATION NEEDS:   Not appropriate for education at this time  Skin:  Skin Assessment: Skin Integrity Issues: Skin Integrity Issues:: Stage II Stage II: sacrum, R buttocks, medial buttocks  Last BM:  1/26  Height:   Ht Readings from Last 1 Encounters:  09/18/2018 5\' 9"  (1.753 m)    Weight:   Wt  Readings from Last 1 Encounters:  09/15/18 87.4 kg    Ideal Body Weight:  72.73 kg  BMI:  Body mass index is 28.45 kg/m.  Estimated Nutritional Needs:   Kcal:  2159 kcal  Protein:  105-131 grams  Fluid:  >/= 2 L/day     Jarome Matin, MS, RD, LDN, [facility] Inpatient Clinical Dietitian Pager # [phone] After hours/weekend pager # [phone]

## 2018-09-15 NOTE — Progress Notes (Signed)
eLink Physician-Brief Progress Note Patient Name: Dylan Jacobs DOB: [birth year] MRN: [id number]   Date of Service  09/15/2018  HPI/Events of Note  Heart rate remains elevated despite Cardizem and Amiodarone boluses and weaning pressors, did not respond to a bolus of 250 ml of 5 % Albumin.  eICU Interventions  Metoprolol 5 mg iv x 1        [place]wo U Ogan 09/15/2018, 10:49 PM

## 2018-09-15 NOTE — Progress Notes (Signed)
Pharmacy Antibiotic Note  Dylan Jacobs is a 58 y.o. male admitted on 09/09/2018 with pneumonia and febrile neutropenia.  Pharmacy has been consulted for Vancomycin, cefepime dosing.  Plan: Vancomycin 2gm iv x1, then  Vancomycin 1500 mg IV Q 24 hrs. Goal AUC 400-550. Expected AUC: 478 SCr used: 1.5  Cefepime 2gm iv x1, then 2gm iv q8hr   Height: 5\' 9"  (175.3 cm) Weight: 198 lb 6.6 oz (90 kg) IBW/kg (Calculated) : 70.7  Temp (24hrs), Avg:98.1 F (36.7 C), Min:97.1 F (36.2 C), Max:99.7 F (37.6 C)  Recent Labs  Lab 09/03/2018 1029 09/07/2018 1152 08/26/2018 1625 09/12/2018 2039  WBC SPECIMEN CLOTTED 0.4* 0.3*  --   CREATININE 1.50*  --   --   --   LATICACIDVEN  --  2.3* 2.4* 1.7    Estimated Creatinine Clearance: 58.8 mL/min (A) (by C-G formula based on SCr of 1.5 mg/dL (H)).    Allergies  Allergen Reactions  . Solu-Medrol [Methylprednisolone] Shortness Of Breath    Antimicrobials this admission: Vancomycin 09/15/2018 >> Cefepime 09/15/2018 >>    Dose adjustments this admission: -  Microbiology results: -  Thank you for allowing pharmacy to be a part of this patient's care.  Nani Skillern Crowford 09/15/2018 5:10 AM

## 2018-09-15 NOTE — Consult Note (Signed)
[facility] Nurse wound consult note Reason for Consult: Erythematous areas in the intragluteal space (not identified as fungal), buttocks and medial thighs. Patient continues to be critically ill and turning and repositioning occasionally results in an unstable hemodynamic state. Pressure injuries are partial thickness and POC to the ischial tuberosities Wound type:Moisture,friction, perfusion Pressure Injury POA: Yes Measurement: IT Stage 2 pressure injuries each measure 2cm x 2.5cm x 0.1cm (Partial thickness, Stage 2) Intragluteal full thickness ulcers are moisture and friction related. Wound KWI:OXBD dry Drainage (amount, consistency, odor) none Periwound:erythematous Dressing procedure/placement/frequency: Patient is on a mattress replacement with low air loss feature, is ventilated and hemodynamically unstable at intervals. Tilting/turning is performed as able. I have added barrier cream to the affected areas. A silicone foam dressing is place prophylactically to the sacrum. Pressure redistribution heel boots are ordered.  [place] nursing team will not follow, but will remain available to this patient, the nursing and medical teams.  Please re-consult if needed. Thanks, Maudie Flakes, MSN, RN, [place], Arther Abbott  Pager# [phone]

## 2018-09-15 NOTE — Procedures (Signed)
Central Venous Catheter Insertion Procedure Note DEAVIN FORST [id number] September 25, 1960  Procedure: Insertion of Central Venous Catheter Indications: Assessment of intravascular volume, Drug and/or fluid administration and Frequent blood sampling  Procedure Details Consent: Risks of procedure as well as the alternatives and risks of each were explained to the (patient/caregiver).  Consent for procedure obtained. Time Out: Verified patient identification, verified procedure, site/side was marked, verified correct patient position, special equipment/implants available, medications/allergies/relevent history reviewed, required imaging and test results available.  Performed  Maximum sterile technique was used including antiseptics, cap, gloves, gown, hand hygiene, mask and sheet. Skin prep: Chlorhexidine; local anesthetic administered A antimicrobial bonded/coated triple lumen catheter was placed in the left subclavian vein using the Seldinger technique.  Evaluation Blood flow good Complications: No apparent complications Patient did tolerate procedure well. Chest X-ray ordered to verify placement.  CXR: pending.  Clementeen Graham 09/15/2018, 10:41 AM  Erick Colace ACNP-BC [place] Pager # [phone] OR # [phone] if no answer

## 2018-09-15 NOTE — Progress Notes (Signed)
Patients color is very poor and breathing is very shallow and weak.  Patient is fully arosable and oriented x4.  veni mask increased to 55%, sat are in the low 90's. Called provider to evaluate.  BP is in the low 80/40.  triturated and turned of diltiazem also.

## 2018-09-15 NOTE — Progress Notes (Addendum)
eLink Physician-Brief Progress Note Patient Name: Dylan Jacobs DOB: 11-25-1960 MRN: [id number]   Date of Service  09/15/2018  HPI/Events of Note  Afib with RVR despite Amiodarone infusion, max Cardizem infusion. Pt is on high dose Norepinephrine infusion with Map now 80-90, Mg ++ is 1.8  eICU Interventions  Cardizem 5 mg iv x 1, wean pressors, check BNP to gauge volume status, will repeat Cardizem bolus if RVR persists, MgSo4 2 gm iv to bring Mg++ level above 2.o        Usman Millett U Tyus Kallam 09/15/2018, 8:08 PM

## 2018-09-16 ENCOUNTER — Ambulatory Visit: Payer: Self-pay

## 2018-09-16 ENCOUNTER — Inpatient Hospital Stay (HOSPITAL_COMMUNITY): Payer: Self-pay

## 2018-09-16 LAB — BLOOD GAS, ARTERIAL
Acid-base deficit: 5.8 mmol/L — ABNORMAL HIGH (ref 0.0–2.0)
Acid-base deficit: 8.1 mmol/L — ABNORMAL HIGH (ref 0.0–2.0)
Bicarbonate: 19.1 mmol/L — ABNORMAL LOW (ref 20.0–28.0)
Bicarbonate: 20.1 mmol/L (ref 20.0–28.0)
Drawn by: 235321
Drawn by: 235321
FIO2: 100
FIO2: 60
LHR: 30 {breaths}/min
MECHVT: 420 mL
MECHVT: 420 mL
O2 SAT: 92.7 %
O2 Saturation: 97.1 %
PCO2 ART: 45.4 mmHg (ref 32.0–48.0)
PEEP: 10 cmH2O
PEEP: 8 cmH2O
Patient temperature: 98.6
Patient temperature: 98.6
RATE: 35 resp/min
pCO2 arterial: 51.9 mmHg — ABNORMAL HIGH (ref 32.0–48.0)
pH, Arterial: 7.192 — CL (ref 7.350–7.450)
pH, Arterial: 7.27 — ABNORMAL LOW (ref 7.350–7.450)
pO2, Arterial: 112 mmHg — ABNORMAL HIGH (ref 83.0–108.0)
pO2, Arterial: 71.8 mmHg — ABNORMAL LOW (ref 83.0–108.0)

## 2018-09-16 LAB — CBC
HCT: 24.3 % — ABNORMAL LOW (ref 39.0–52.0)
Hemoglobin: 7.4 g/dL — ABNORMAL LOW (ref 13.0–17.0)
MCH: 26.1 pg (ref 26.0–34.0)
MCHC: 30.5 g/dL (ref 30.0–36.0)
MCV: 85.6 fL (ref 80.0–100.0)
Platelets: 41 10*3/uL — ABNORMAL LOW (ref 150–400)
RBC: 2.84 MIL/uL — ABNORMAL LOW (ref 4.22–5.81)
RDW: 15.6 % — ABNORMAL HIGH (ref 11.5–15.5)
WBC: 0.7 10*3/uL — CL (ref 4.0–10.5)
nRBC: 0 % (ref 0.0–0.2)

## 2018-09-16 LAB — GLUCOSE, CAPILLARY
GLUCOSE-CAPILLARY: 259 mg/dL — AB (ref 70–99)
Glucose-Capillary: 260 mg/dL — ABNORMAL HIGH (ref 70–99)
Glucose-Capillary: 224 mg/dL — ABNORMAL HIGH (ref 70–99)

## 2018-09-16 LAB — PHOSPHORUS: PHOSPHORUS: 3.4 mg/dL (ref 2.5–4.6)

## 2018-09-16 LAB — BASIC METABOLIC PANEL
ANION GAP: 8 (ref 5–15)
BUN: 50 mg/dL — ABNORMAL HIGH (ref 6–20)
CO2: 20 mmol/L — ABNORMAL LOW (ref 22–32)
Calcium: 6.7 mg/dL — ABNORMAL LOW (ref 8.9–10.3)
Chloride: 103 mmol/L (ref 98–111)
Creatinine, Ser: 1.67 mg/dL — ABNORMAL HIGH (ref 0.61–1.24)
GFR calc Af Amer: 51 mL/min — ABNORMAL LOW (ref >60)
GFR, EST NON AFRICAN AMERICAN: 44 mL/min — AB (ref >60)
Glucose, Bld: 295 mg/dL — ABNORMAL HIGH (ref 70–99)
Potassium: 4.2 mmol/L (ref 3.5–5.1)
Sodium: 131 mmol/L — ABNORMAL LOW (ref 135–145)

## 2018-09-16 LAB — MAGNESIUM: Magnesium: 2.1 mg/dL (ref 1.7–2.4)

## 2018-09-16 LAB — PROCALCITONIN: Procalcitonin: 28.69 ng/mL

## 2018-09-16 MED ORDER — LEVALBUTEROL HCL 0.63 MG/3ML IN NEBU: 0.6300 mg | INHALATION_SOLUTION | RESPIRATORY_TRACT | Status: DC | PRN

## 2018-09-16 MED ORDER — LORAZEPAM 2 MG/ML IJ SOLN
1.0000 mg | INTRAMUSCULAR | Status: DC | PRN
  Administered 2018-09-16: 1 mg via INTRAVENOUS
  Filled 2018-09-16: qty 1

## 2018-09-16 MED ORDER — MIDAZOLAM BOLUS VIA INFUSION
2.0000 mg | INTRAVENOUS | Status: DC | PRN
  Filled 2018-09-16: qty 2

## 2018-09-16 MED ORDER — GLYCOPYRROLATE 0.2 MG/ML IJ SOLN
0.2000 mg | INTRAMUSCULAR | Status: DC | PRN
  Administered 2018-09-16: 0.2 mg via INTRAVENOUS
  Filled 2018-09-16: qty 1

## 2018-09-16 MED ORDER — POLYVINYL ALCOHOL 1.4 % OP SOLN
1.0000 [drp] | Freq: Four times a day (QID) | OPHTHALMIC | Status: DC | PRN
  Filled 2018-09-16: qty 15

## 2018-09-16 MED ORDER — FENTANYL 2500MCG IN NS 250ML (10MCG/ML) PREMIX INFUSION
100.0000 ug/h | INTRAVENOUS | Status: DC
  Administered 2018-09-16: 100 ug/h via INTRAVENOUS
  Filled 2018-09-16: qty 250

## 2018-09-16 MED ORDER — GLYCOPYRROLATE 0.2 MG/ML IJ SOLN: 0.2000 mg | INTRAMUSCULAR | Status: DC | PRN

## 2018-09-16 MED ORDER — DEXTROSE 5 % IV SOLN: INTRAVENOUS | Status: DC

## 2018-09-16 MED ORDER — IPRATROPIUM BROMIDE 0.02 % IN SOLN: 0.5000 mg | RESPIRATORY_TRACT | Status: DC | PRN

## 2018-09-16 MED ORDER — ARTIFICIAL TEARS OPHTHALMIC OINT
1.0000 "application " | TOPICAL_OINTMENT | Freq: Three times a day (TID) | OPHTHALMIC | Status: DC
  Administered 2018-09-16: 1 via OPHTHALMIC
  Filled 2018-09-16: qty 3.5

## 2018-09-16 MED ORDER — ACETAMINOPHEN 650 MG RE SUPP: 650.0000 mg | Freq: Four times a day (QID) | RECTAL | Status: DC | PRN

## 2018-09-16 MED ORDER — ACETAMINOPHEN 325 MG PO TABS: 650.0000 mg | ORAL_TABLET | Freq: Four times a day (QID) | ORAL | Status: DC | PRN

## 2018-09-16 MED ORDER — CHLORHEXIDINE GLUCONATE CLOTH 2 % EX PADS: 6.0000 | MEDICATED_PAD | Freq: Every day | CUTANEOUS | Status: DC

## 2018-09-16 MED ORDER — SODIUM CHLORIDE 0.9% FLUSH: 10.0000 mL | INTRAVENOUS | Status: DC | PRN

## 2018-09-16 MED ORDER — FENTANYL CITRATE (PF) 100 MCG/2ML IJ SOLN: 100.0000 ug | Freq: Once | INTRAMUSCULAR | Status: DC

## 2018-09-16 MED ORDER — CALCIUM GLUCONATE-NACL 2-0.675 GM/100ML-% IV SOLN
2.0000 g | Freq: Once | INTRAVENOUS | Status: AC
  Administered 2018-09-16: 2000 mg via INTRAVENOUS
  Filled 2018-09-16: qty 100

## 2018-09-16 MED ORDER — SODIUM CHLORIDE 0.9% FLUSH: 10.0000 mL | Freq: Two times a day (BID) | INTRAVENOUS | Status: DC

## 2018-09-16 MED ORDER — GLYCOPYRROLATE 1 MG PO TABS: 1.0000 mg | ORAL_TABLET | ORAL | Status: DC | PRN

## 2018-09-16 MED ORDER — MIDAZOLAM HCL 2 MG/2ML IJ SOLN: 2.0000 mg | Freq: Once | INTRAMUSCULAR | Status: DC | PRN

## 2018-09-16 MED ORDER — SODIUM BICARBONATE 8.4 % IV SOLN
INTRAVENOUS | Status: DC
  Administered 2018-09-16: 02:00:00 via INTRAVENOUS
  Filled 2018-09-16: qty 150

## 2018-09-16 MED ORDER — HEPARIN (PORCINE) 25000 UT/250ML-% IV SOLN
1000.0000 [IU]/h | INTRAVENOUS | Status: DC
  Administered 2018-09-16: 1000 [IU]/h via INTRAVENOUS
  Filled 2018-09-16: qty 250

## 2018-09-16 MED ORDER — SODIUM CHLORIDE 0.9 % IV SOLN
3.0000 ug/kg/min | INTRAVENOUS | Status: DC
  Filled 2018-09-16: qty 20

## 2018-09-16 MED ORDER — FENTANYL CITRATE (PF) 100 MCG/2ML IJ SOLN
100.0000 ug | Freq: Once | INTRAMUSCULAR | Status: AC | PRN
  Administered 2018-09-16: 100 ug via INTRAVENOUS

## 2018-09-16 MED ORDER — MIDAZOLAM 50MG/50ML (1MG/ML) PREMIX INFUSION
2.0000 mg/h | INTRAVENOUS | Status: DC
  Administered 2018-09-16: 2 mg/h via INTRAVENOUS
  Filled 2018-09-16: qty 50

## 2018-09-16 MED ORDER — SODIUM CHLORIDE 0.9 % IV SOLN
25.0000 ug/min | INTRAVENOUS | Status: DC
  Administered 2018-09-16: 95 ug/min via INTRAVENOUS
  Filled 2018-09-16 (×3): qty 4

## 2018-09-16 MED ORDER — DILTIAZEM HCL-DEXTROSE 100-5 MG/100ML-% IV SOLN (PREMIX)
5.0000 mg/h | INTRAVENOUS | Status: DC
  Administered 2018-09-16: 5 mg/h via INTRAVENOUS
  Filled 2018-09-16 (×2): qty 100

## 2018-09-16 MED ORDER — CISATRACURIUM BOLUS VIA INFUSION
0.0500 mg/kg | Freq: Once | INTRAVENOUS | Status: DC
  Filled 2018-09-16: qty 5

## 2018-09-16 MED ORDER — INSULIN ASPART 100 UNIT/ML ~~LOC~~ SOLN
0.0000 [IU] | SUBCUTANEOUS | Status: DC
  Administered 2018-09-16: 5 [IU] via SUBCUTANEOUS
  Administered 2018-09-16: 8 [IU] via SUBCUTANEOUS

## 2018-09-16 MED ORDER — FENTANYL BOLUS VIA INFUSION
50.0000 ug | INTRAVENOUS | Status: DC | PRN
  Filled 2018-09-16: qty 50

## 2018-09-16 MED ORDER — MIDAZOLAM HCL 2 MG/2ML IJ SOLN: 2.0000 mg | Freq: Once | INTRAMUSCULAR | Status: DC

## 2018-09-16 MED ORDER — PHENYLEPHRINE HCL-NACL 40-0.9 MG/250ML-% IV SOLN
25.0000 ug/min | INTRAVENOUS | Status: DC
  Administered 2018-09-16: 200 ug/min via INTRAVENOUS
  Filled 2018-09-16: qty 250

## 2018-09-16 MED ORDER — DIPHENHYDRAMINE HCL 50 MG/ML IJ SOLN: 25.0000 mg | INTRAMUSCULAR | Status: DC | PRN

## 2018-09-16 MED FILL — Medication: Qty: 1 | Status: AC

## 2018-09-17 ENCOUNTER — Ambulatory Visit: Payer: Self-pay

## 2018-09-17 NOTE — Progress Notes (Signed)
2mg  versed wasted in stericycle.  Wasted by Juleen China, witness by Darrick Meigs RN

## 2018-09-18 ENCOUNTER — Telehealth: Payer: Self-pay | Admitting: Pulmonary Disease

## 2018-09-18 ENCOUNTER — Ambulatory Visit: Payer: Self-pay

## 2018-09-18 NOTE — Telephone Encounter (Incomplete)
09/18/2018 I received D/C from Forbis and Barbarann Ehlers will send to Dr Lamonte Sakai at [facility] . PWR.

## 2018-09-19 LAB — CULTURE, RESPIRATORY W GRAM STAIN

## 2018-09-19 LAB — CULTURE, RESPIRATORY

## 2018-09-19 NOTE — Progress Notes (Signed)
Family at bedside declines chaplain at this time.

## 2018-09-19 NOTE — Progress Notes (Addendum)
IV lopressor ordered for a-fib with RVR not controlled by Cardizem and amiodarone. Blood pressure at 2315 reading 103/61, pulse 144. IV lopressor administered via IV push. Blood pressure rechecked after push reading 93-61 with HR of 129. Heart rate noted to drop well into 90's. At this time pulse ox began reading O2 sat of 70-80 with poor waveform. Attempted to change out probe. Once probe was properly fixed O2 sat reading 79. BP 59/17. Pulse still reading a-fib 90-100's. CCM notified to camera in. No pulse could be palpated. CPR initiated. Two rounds of epinephrine given. Patient regained palpable pulse and BP reading 104/88. Fentanyl, amiodarone, and Cardizem turned off at this time. Critical Care MD at bedside will continue to closely monitor.

## 2018-09-19 NOTE — Progress Notes (Signed)
NAME:  Dylan Jacobs, MRN:  [id number], DOB:  [birth year], LOS: 2 ADMISSION DATE:  08/31/2018, CONSULTATION DATE:  09/15/2018 REFERRING MD:  Clance Boll, [facility], CHIEF COMPLAINT:  Short of breath   Brief History   58 yo with male smoker with progressive dyspnea, a fib.  He has hx of Stage IIIa NSCLC s/p chemo (carboplatin, paclitaxel) 7 days prior to admission.  Had neutropenia.  Developed progressive respiratory failure and required intubation.  Past Medical History  PUD, CVA, HTN, DM  Significant Hospital Events   1/27 Admit 1/28 VDRF, escalating pressor needs, worsening renal function.  Central access being placed, antibiotics continued.  Cardiac arrest during the evening hours, PEA with ROSC estimated around 10 minutes.  Likely multifactorial from acidosis and ongoing sepsis 1/29: Long discussion with family.  Patient has had persistent failure to thrive for several weeks now barely able to walk walk in room without dyspnea, too fatigued to eat.  Has expressed to his family in the past that he would not want life support.  Family has decided on withdrawal of care and transition to comfort  Consults:    Procedures:  ETT 1/28 >>  Left subclavian triple-lumen catheter 1/28 Significant Diagnostic Tests:    Micro Data:  Blood 1/27 >> Sputum 1/28 >> abundant gram-positive cocci in pairs, few gram variable rods, few gram-positive cocci in chains.  Antimicrobials:  Cefepime 1/28 >> Vancomycin 1/28 >>  Interim history/subjective:  Remains in refractory shock  Objective   Blood pressure 114/89, pulse (Abnormal) 143, temperature 98.1 F (36.7 C), temperature source Oral, resp. rate (Abnormal) 36, height 5\' 9"  (1.753 m), weight 87.4 kg, SpO2 100 %. CVP:  [9 mmHg-22 mmHg] 17 mmHg  Vent Mode: PRVC FiO2 (%):  [60 %-100 %] 60 % Set Rate:  [30 bmp-35 bmp] 35 bmp Vt Set:  [420 mL-560 mL] 420 mL PEEP:  [8 cmH20-10 cmH20] 10 cmH20 Plateau Pressure:  [18 cmH20-29 cmH20] 18 cmH20    Intake/Output Summary (Last 24 hours) at 14-Oct-2018 1102 Last data filed at October 14, 2018 1000 Gross per 24 hour  Intake 4689.12 ml  Output 1810 ml  Net 2879.12 ml   Filed Weights   08/24/2018 1250 09/15/18 0500  Weight: 90 kg 87.4 kg    Examination: General: Critically ill 58 year old male still with marked accessory use even on sedation, bicarbonate, and full ventilatory support HEENT temporal wasting, orally intubated, mucous membranes dry, neck veins flat Pulmonary: Diffusely wheezing, marked accessory use, rales bilaterally right greater than left Cardiac: Ongoing tachyarrhythmia Abdomen: Soft nontender Extremities: Cool, mottled. Neuro: Obtunded GU: Clear yellow  Resolved Hospital Problem list     Assessment & Plan:   Acute on chronic respiratory failure with hypoxia 2/2 PNA superimposed on COPD and lung cancer. Septic shock with PNA. PEA cardiac arrest Stage IIIA NSCLC s/p chemoradiation. Chemo induced pancytopenia. Severe protein calorie malnutrition. Failure to thrive A fib with RVR. Acute kidney injury in setting of sepsis/septic shock Fluid and electrolyte imbalance:Her medications were hyponatremia stable. DM with hyperglycemia. Acute metabolic encephalopathy.   Discussion: He was just last most of this right now and will look at the notes while wearing clinic okay Plan Continuing fentanyl drip, titrate for comfort Add PRN Ativan Discontinue vasoactive infusions Discontinue antibiotics Discontinue antiarrhythmics Supportive care for family  I suspect he will pass away in minutes to hours following extubation or perhaps even before extubation completed  My critical care time is 55 minutes  Best practice:  Diet: tube feeds DVT prophylaxis:  SCDs GI prophylaxis: Protonix Mobility: bed rest Code Status: DNR Family Communication: See note above  disposition: Transition to comfort       09/24/18, 11:02 AM

## 2018-09-19 NOTE — Progress Notes (Signed)
200 mL of Fentanyl wasted and 25 mL of Versed wasted with Science writer

## 2018-09-19 NOTE — Procedures (Signed)
Extubation Procedure Note  Patient Details:   Name: CYPHER PAULE DOB: [birth year] MRN: [id number]   Airway Documentation:  Airway 8 mm (Active)  Secured at (cm) 23 cm 2018/10/07  8:28 AM  Measured From Lips 10-07-2018  8:28 AM  Secured Location Right 10/07/18  8:28 AM  Secured By [employer] Oct 07, 2018  8:28 AM  Tube Holder Repositioned Yes 10/07/18  8:28 AM  Cuff Pressure (cm H2O) 22 cm H2O 10-07-18  8:28 AM  Site Condition Dry October 07, 2018  8:28 AM   Vent end date: 10-07-18 Vent end time: 1147   Evaluation  O2 sats: currently acceptable Complications: No apparent complications Patient did tolerate procedure well. Bilateral Breath Sounds: Coarse crackles   No   Patient extubated for comfort care.   Lamonte Sakai 2018/10/07, 11:49 AM

## 2018-09-19 NOTE — Progress Notes (Signed)
Md notified that pt may have vomited. Thin, yellow fluid came up from ET tube while patient was rolled on his side during bath. Instructed to continue monitoring, let tube feeds run, and notify CCM if it happens again. Will continue to closely monitor at this time.Marland Kitchen

## 2018-09-19 NOTE — Progress Notes (Signed)
MD notified of HR in 140's-170. 5 mg IV cardizem bolus ordered and administered. Instructed to attempt to wean off of levophed. Mg ordered will administer

## 2018-09-19 NOTE — Progress Notes (Signed)
ANTICOAGULATION CONSULT NOTE - Initial Consult  Pharmacy Consult for Heparin Indication: atrial fibrillation  Allergies  Allergen Reactions  . Solu-Medrol [Methylprednisolone] Shortness Of Breath    Patient Measurements: Height: 5\' 9"  (175.3 cm) Weight: 192 lb 10.9 oz (87.4 kg) IBW/kg (Calculated) : 70.7 Heparin Dosing Weight:   Vital Signs: Temp: 98.4 F (36.9 C) (01/28 2000) Temp Source: Axillary (01/28 2000) BP: 101/65 (01/28 2100) Pulse Rate: 81 (01/28 2100)  Labs: Recent Labs    09/07/2018 1029 09/18/2018 1152 09/02/2018 1625 09/15/18 0747  HGB SPECIMEN CLOTTED 8.8* 8.6* 7.3*  HCT SPECIMEN CLOTTED 27.9* 28.0* 24.8*  PLT SPECIMEN CLOTTED 61* 62* 60*  LABPROT 14.6  --   --   --   INR 1.15  --   --   --   CREATININE 1.50*  --   --  1.75*    Estimated Creatinine Clearance: 49.8 mL/min (A) (by C-G formula based on SCr of 1.75 mg/dL (H)).   Medical History: Past Medical History:  Diagnosis Date  . Cancer (HCC)    Rib  . Diabetes mellitus without complication ([place])   . Fractured rib   . Hypertension   . Stroke ([place])   . Ulcer     Medications:  Infusions:  . sodium chloride 10 mL/hr at 09/15/18 2000  . sodium chloride    . amiodarone 30 mg/hr (09/15/18 2000)  . ceFEPime (MAXIPIME) IV Stopped (09/15/18 2200)  . cisatracurium (NIMBEX) infusion    . feeding supplement (OSMOLITE 1.5 CAL) 1,000 mL (09/15/18 1714)  . fentaNYL infusion INTRAVENOUS    . heparin    . midazolam    . norepinephrine (LEVOPHED) Adult infusion 17 mcg/min (09/15/18 2131)  . phenylephrine (NEO-SYNEPHRINE) Adult infusion Stopped (09/15/18 1119)  .  sodium bicarbonate  infusion 1000 mL    . vancomycin    . vasopressin (PITRESSIN) infusion - *FOR SHOCK* 0.04 Units/min (09/15/18 2149)    Assessment: Patient s/p code and still in Afib per notes.  Did not note active heparin/enoxaparin order.  Unclear if patient was taking rivaroxaban prior to admission--notes noted Rx, but also not started.   Last PLTs noted 60k.  Goal of Therapy:  Heparin level 0.3-0.7 units/ml Monitor platelets by anticoagulation protocol: Yes   Plan:  No heparin bolus due to low PLTs Heparin at 1000 units/hr Heparin level at 1000.  Tyler Deis, Shea Stakes Crowford 2018/10/14,12:57 AM

## 2018-09-19 NOTE — Progress Notes (Signed)
Nutrition Brief Note  Patient seen by this RD on 1/28. Chart reviewed. Per Pete's note this AM, "family has decided on withdrawal of care and transition to comfort." Pt now transitioning to comfort care.  No further nutrition interventions warranted at this time.  Please re-consult as needed.     Jarome Matin, MS, RD, LDN, [facility] Inpatient Clinical Dietitian Pager # [phone] After hours/weekend pager # [phone]

## 2018-09-19 NOTE — Accreditation Note (Signed)
Restraints not reported to CMS Pursuant to regulation 482.13 (G) (3) use of soft wrist restraints was logged on 09/18/2018 @ 1137.

## 2018-09-19 NOTE — Progress Notes (Signed)
Family took patient's wallet and stated they wanted all other belongings thrown away.

## 2018-09-19 NOTE — Progress Notes (Signed)
CCM notified that patient is doing well under fentanyl and versed sedation. CCM also believes there is no use for nimbex. Drip discontinued will continue to closely monitor at this time

## 2018-09-19 NOTE — Progress Notes (Signed)
Met with patient's brother.  Discussed series of events during hospital status and most recent events with cardiac arrest.  Goal is to continue current aggressive measures with the hope that he can recover.  However, if he develops cardiac arrest again, then he should not undergo repeat cardiac resuscitation (no CPR, no defibrillation).  Chesley Mires, MD [facility] Pulmonary/Critical Care 2018/09/19, 12:27 AM

## 2018-09-19 NOTE — Procedures (Signed)
Arterial Catheter Insertion Procedure Note QUINTAVIUS NIEBUHR [id number] 1961-02-17  Procedure: Insertion of Arterial Catheter  Indications: Blood pressure monitoring  Procedure Details Consent: Unable to obtain consent because of emergent medical necessity. Time Out: Verified patient identification, verified procedure, site/side was marked, verified correct patient position, special equipment/implants available, medications/allergies/relevent history reviewed, required imaging and test results available.  Performed  Maximum sterile technique was used including antiseptics, cap, gloves, gown, hand hygiene, mask and sheet. Skin prep: Chlorhexidine; local anesthetic administered 20 gauge catheter was inserted into left radial artery using the Seldinger technique. ULTRASOUND GUIDANCE USED: NO Evaluation Blood flow good; BP tracing good. Complications: No apparent complications.   Rosann Auerbach 10/10/2018

## 2018-09-19 NOTE — Progress Notes (Signed)
Patient passed away at 1202. Patient's family was present at bedside. Lung and breath sounds were ausculted by Darrick Meigs RN and Curator. Condolence was given to family at that time.

## 2018-09-19 NOTE — Progress Notes (Addendum)
Pt had persistent A fib with RVR.  Noted to have progressive hypoxia.  Bedside staff unable to detect pulse.  CPR started.  Had 2 rounds of epinephrine and had ROSC.    Remains on increased FiO2 with increased WOB.  Remains on pressors.  Remains tachycardic.  Will start phenylephrine and transition off levophed.  Continue vasopressin.  Resume amiodarone.  Will add nimbex, versed, fentanyl to alleviate WOB.  Change Vt to 6 cc/kg per ARDS protocol.  Given advance cancer, pneumonia in setting of pancytopenia, I do not think he is an appropriate candidate for TTM.  CC time 34 minutes  Chesley Mires, MD [facility] Pulmonary/Critical Care 29-Sep-2018, 12:09 AM

## 2018-09-19 NOTE — Progress Notes (Signed)
[place] Progress Note Patient Name: DENNISE BAMBER DOB: 10/30/1960 MRN: [id number]   Date of Service  10-05-2018  HPI/Events of Note  Hyperglycemia  eICU Interventions  Hyperglycemia protocol with Q 4 hour POC blood glucose checks and sliding scale Humalog coverage        Frederik Pear 2018/10/05, 3:57 AM

## 2018-09-19 NOTE — Progress Notes (Signed)
eLink Physician-Brief Progress Note Patient Name: Dylan Jacobs DOB: 07-Feb-1961 MRN: [id number]   Date of Service  2018/09/27  HPI/Events of Note  Transient cardio-pulmonary arrest during which heart rate was 90 on Cardizem , Amiodarone. PEA. ROSC following ACLS protocol. Desaturation appeared to preceed profound drop in blood pressure but post-rescusitation chest xray was unremarkable for any significant change from baseline. Pt reestablished a good pulse and BP. Cooling contraindicated by co-morbidities.  eICU Interventions  ACLS protocol, continue Amiodarone, hold Cardizem        Anaijah Augsburger U Mekiyah Gladwell 09/27/2018, 1:34 AM

## 2018-09-19 NOTE — Progress Notes (Signed)
eLink Physician-Brief Progress Note Patient Name: Dylan Jacobs DOB: Dec 31, 1960 MRN: [id number]   Date of Service  Oct 07, 2018  HPI/Events of Note  Hypocalcemia  eICU Interventions  Calcium Gluconate 2 gm  Iv x 1, check ionized calcium at noon today        Frederik Pear 10/07/2018, 6:31 AM

## 2018-09-19 DEATH — deceased

## 2018-09-20 LAB — CULTURE, BLOOD (ROUTINE X 2)
Culture: NO GROWTH
Culture: NO GROWTH
Special Requests: ADEQUATE

## 2018-09-21 ENCOUNTER — Other Ambulatory Visit: Payer: Self-pay

## 2018-09-21 ENCOUNTER — Ambulatory Visit: Payer: Self-pay

## 2018-09-21 ENCOUNTER — Encounter: Payer: Self-pay | Admitting: Nutrition

## 2018-09-21 NOTE — Telephone Encounter (Signed)
Received signed D/C, [facility] home notified for pick up.

## 2018-09-22 ENCOUNTER — Ambulatory Visit: Payer: Self-pay

## 2018-09-23 ENCOUNTER — Ambulatory Visit: Payer: Self-pay

## 2018-09-23 ENCOUNTER — Encounter: Payer: Self-pay | Admitting: Radiation Oncology

## 2018-09-23 NOTE — Progress Notes (Signed)
  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 Throughout the course of treatment, he denied SOB and pain. Toward the end of his treatment, he reported moderate fatigue, poor appetite, vomiting, and weight loss of around 25 pounds.  The patient developed  Increasing difficulty with breathing related to his cardiac issues and lung cancer. After his 20th treatment the patient finally agreed to present to the ER.  after completing his 20th fraction, he was admitted to critical care from the ED with respiratory failure where he unfortunately passed on Sep 29, 2022.    -----------------------------------  Blair Promise, PhD, MD  This document serves as a record of services personally performed by Gery Pray, MD. It was created on his behalf by Mary-Margaret Loma Messing, a trained medical scribe. The creation of this record is based on the scribe's personal observations and the provider's statements to them. This document has been checked and approved by the attending provider.

## 2018-09-24 ENCOUNTER — Ambulatory Visit: Payer: Self-pay

## 2018-09-25 ENCOUNTER — Ambulatory Visit: Payer: Self-pay

## 2018-09-28 ENCOUNTER — Ambulatory Visit: Payer: Self-pay

## 2018-09-29 ENCOUNTER — Ambulatory Visit: Payer: Self-pay

## 2018-09-30 ENCOUNTER — Ambulatory Visit: Payer: Self-pay

## 2018-10-09 DIAGNOSIS — R6521 Severe sepsis with septic shock: Secondary | ICD-10-CM

## 2018-10-09 DIAGNOSIS — A419 Sepsis, unspecified organism: Secondary | ICD-10-CM | POA: Diagnosis present

## 2018-10-18 NOTE — Death Summary Note (Signed)
DEATH SUMMARY   Patient Details  Name: Dylan Jacobs MRN: [id number] DOB: 03-09-1961  Admission/Discharge Information   Admit Date:  10/09/2018  Date of Death: Date of Death: 10-11-18  Time of Death: Time of Death: 1200-11-21  Length of Stay: 2  Referring Physician: [name], MD   Reason(s) for Hospitalization  Neutropenia, healthcare associated pneumonia  Diagnoses  Preliminary cause of death:   Septic shock  Secondary Diagnoses (including complications and co-morbidities):  Principal Problem:   Septic shock ([place]) Active Problems:   DM (diabetes mellitus) ([place])   HTN (hypertension)   Hyperlipidemia   Stage III squamous cell carcinoma of left lung ([place])   Encounter for central line placement   Pleural effusion   Acute on chronic respiratory failure with hypoxia ([facility])   Atrial fibrillation with RVR ([place])   Acute respiratory distress   AKI (acute kidney injury) ([place])   Healthcare-associated pneumonia   Malnutrition of moderate degree   Brief Hospital Course (including significant findings, care, treatment, and services provided and events leading to death)  Dylan Jacobs is a 58 y.o. year old male smoker who was under therapy for stage IIIa non-small cell lung cancer with carboplatinum and Paxitaxel.  He presented 10-09-2022 with respiratory distress, cough, fever.  He was treated with broad-spectrum antibiotics for healthcare associated pneumonia.  He is found to be neutropenic with evolving acute renal failure and a metabolic acidosis.  He progressed to respiratory distress and ultimately respiratory failure and required intubation on 1/28.  Pressors were initiated for septic shock.  Unfortunately he failed to stabilize and he experienced a PEA cardiac arrest on 1/28.  Resuscitation took 10 minutes of CPR.  Sputum culture grew out abundant gram-positive cocci few gram variable rods with cultures ultimately positive for Klebsiella pneumonia.  Based on his failure to stabilize  discussions were undertaken with his family regarding goals of care.  It was noted that he had been declining significantly over the several weeks prior to the admission in the setting of his chemotherapy and his underlying disease.  It was clear that based on the patient's wishes regarding his goals for care he would not want continued support if he did not have a reasonable chance to achieve a meaningful recovery and quality of life.  Given his continued decline decision was made to withdraw care and transition to comfort based approach.  This was done on 10-11-22, he expired on the same day.    Pertinent Labs and Studies  Significant Diagnostic Studies Dg Chest 2 View  Result Date: 10-09-18 CLINICAL DATA:  History of lung cancer, shortness of breath. EXAM: CHEST - 2 VIEW COMPARISON:  Radiographs of September 03, 2018. CT scan of August 24, 2018. FINDINGS: The heart size and mediastinal contours are within normal limits. No pneumothorax is noted. Right lung is clear. Stable appearance of large necrotic mass seen involving the left lower lung with lytic destruction of adjacent left ribs. IMPRESSION: Stable appearance of large necrotic mass seen in left lower lobe lytic destruction of adjacent left ribs. Electronically Signed   By: Marijo Conception, M.D.   On: 2018-10-09 11:44   Dg Abd 1 View  Result Date: 09/15/2018 CLINICAL DATA:  Gastric tube placement EXAM: ABDOMEN - 1 VIEW COMPARISON:  None. FINDINGS: The tip and side port of a gastric tube are seen below the left hemidiaphragm in the expected location of the stomach. Small left effusion and confluent airspace disease is noted at the left lung base. Patchy  minimal airspace disease is noted at the right lung base overlying the costophrenic angle. Bowel gas pattern is unremarkable with mild gaseous distention of large bowel. No free air is identified. No organomegaly is noted. No radiopaque calculi. IMPRESSION: The tip and side port of a gastric tube are  seen below the left hemidiaphragm in the expected location of the stomach. Electronically Signed   By: Ashley Royalty M.D.   On: 09/15/2018 03:23   Dg Chest Port 1 View  Result Date: 09-19-18 CLINICAL DATA:  Respiratory failure. Pulmonary infiltrates. Or squamous cell carcinoma of the left lung. EXAM: PORTABLE CHEST 1 VIEW COMPARISON:  Chest x-rays dated 09/15/2018, 08/23/2018, and chest CT dated 08/24/2018 FINDINGS: Endotracheal tube is in good position 5 cm above carina. NG tube tip is below the diaphragm. Left central line and is in the superior vena cava at the level of the azygos vein. There are persistent infiltrates in the right mid and lower lung zones in the left perihilar region, unchanged. Cavitary mass in the left hemithorax laterally with adjacent bone destruction appears essentially unchanged. Heart size and vascularity are normal. IMPRESSION: No significant change. Persistent bilateral infiltrates. No change in the cavitary mass in the left hemithorax. Electronically Signed   By: Lorriane Shire M.D.   On: September 19, 2018 07:01   Dg Chest Port 1 View  Result Date: Sep 19, 2018 CLINICAL DATA:  58 y/o  M; status post CPR. EXAM: PORTABLE CHEST 1 VIEW COMPARISON:  09/15/2018 chest radiograph. FINDINGS: Stable position of left central venous catheter, endotracheal tube, and enteric tube. Stable pulmonary consolidations within the mid and lower lung zones. Stable left lung base mass with rib destruction. Stable cardiac silhouette. IMPRESSION: Stable lines and tubes. Stable pulmonary consolidations and left lung base mass with rib destruction. Electronically Signed   By: Kristine Garbe M.D.   On: 2018/09/19 00:19   Dg Chest Port 1 View  Result Date: 09/15/2018 CLINICAL DATA:  Encounter for central line placement EXAM: PORTABLE CHEST 1 VIEW COMPARISON:  Earlier today FINDINGS: New left subclavian central line with tip at the SVC origin. No pneumothorax or new mediastinal widening. Extensive  airspace disease and left-sided pleural base mass with rib destruction. Normal heart size. An orogastric tube is also been placed and reach the stomach. Stable endotracheal tube positioning. IMPRESSION: New central line without complicating feature. Electronically Signed   By: Monte Fantasia M.D.   On: 09/15/2018 11:27   Dg Chest Port 1 View  Result Date: 09/15/2018 CLINICAL DATA:  Endotracheal tube placement EXAM: PORTABLE CHEST 1 VIEW COMPARISON:  09/05/2018 FINDINGS: Endotracheal tube tip is just below the level of the clavicular heads, 4 cm above the carina. There are bilateral airspace opacities, left-greater-than-right. Redemonstration of known partially necrotic left lung lesion. IMPRESSION: 1. Endotracheal tube tip just below the level of the clavicular heads, 4 cm above the carina. 2. Otherwise unchanged appearance of the chest with partially necrotic left lung lesion. Electronically Signed   By: Ulyses Jarred M.D.   On: 09/15/2018 02:23   Dg Chest Port 1 View  Result Date: 09/17/2018 CLINICAL DATA:  Shortness of breath.  History of lung cancer. EXAM: PORTABLE CHEST 1 VIEW COMPARISON:  And 09/05/2018. PA and lateral chest earlier today CT chest 08/24/2018. FINDINGS: Large necrotic mass in the left chest invading the left chest wall is unchanged. The right lung is clear. Emphysema is noted. Heart size is normal. IMPRESSION: No acute disease in patient with a large necrotic left lung mass. Emphysema. Electronically Signed  By: Inge Rise M.D.   On: 08/29/2018 14:22    Microbiology Resp cx >> K. pneumonae   Procedures/Operations  ETT CVC   Baltazar Apo, MD, PhD 10/09/2018, 10:17 AM [facility] [phone] or if no answer [phone]

## 2020-01-31 IMAGING — CT NM PET TUM IMG INITIAL (PI) SKULL BASE T - THIGH
9 series · 25 of 25 positions shown · non-contrast
Comparison: Chest CT 07/10/2018

CLINICAL DATA: Initial treatment strategy for non-small cell lung
cancer..

EXAM:
NUCLEAR MEDICINE PET SKULL BASE TO THIGH
TECHNIQUE: 9.88 mCi F-18 FDG was injected intravenously. Full-ring PET imaging
was performed from the skull base to thigh after the radiotracer. CT
data was obtained and used for attenuation correction and anatomic
localization.
Fasting blood glucose: 137 mg/dl

[Series 3: ct wb 5.0 b30f · axial · 5.0mm · 0.98mm/px · z∈[-1226,-242]mm · 3 of 323 slices shown]
[im 1/323]
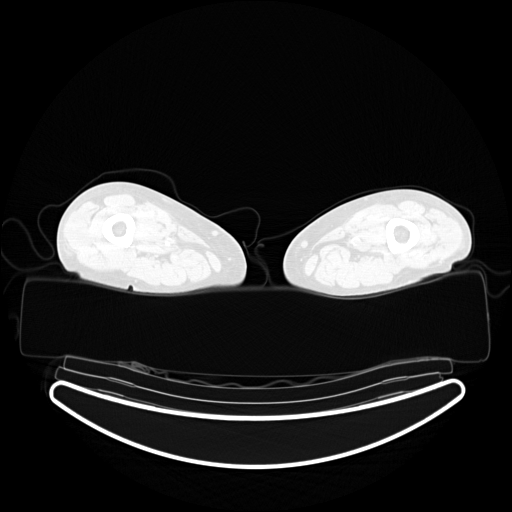
[im 162/323]
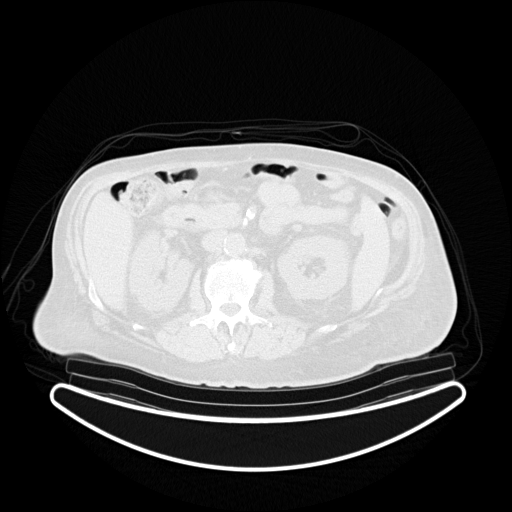
[im 323/323  brain]
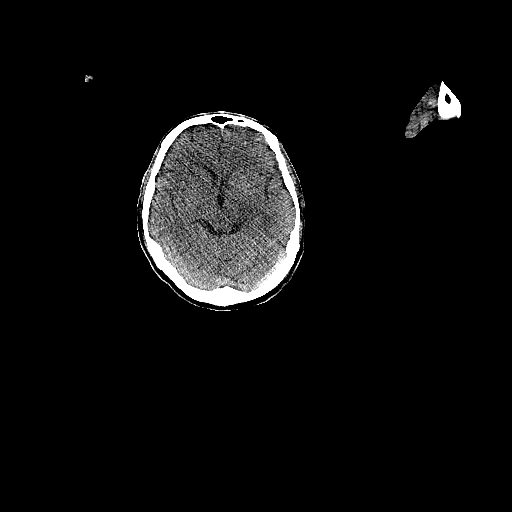

[Series 4: pet wb (ac) · axial · 5.0mm · 2.72mm/px · z∈[-1226,-242]mm · 4 of 329 slices shown]
[im 1/329]
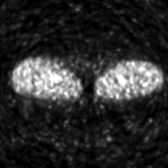
[im 110/329]
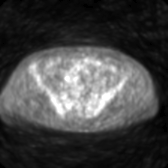
[im 219/329]
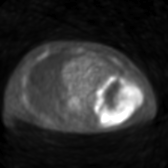
[im 329/329]
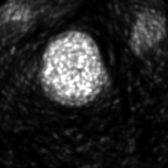

[Series 5: pet wb uncorrected (nac) · axial · 5.0mm · 4.07mm/px · z∈[-1226,-242]mm · 4 of 329 slices shown]
[im 1/329]
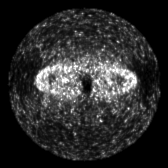
[im 110/329]
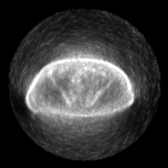
[im 219/329]
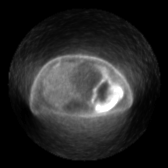
[im 329/329]
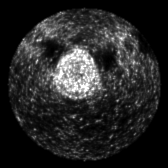

[Series 603: pet_ct axial fused · 4 of 324 slices shown]
[im 1/324]
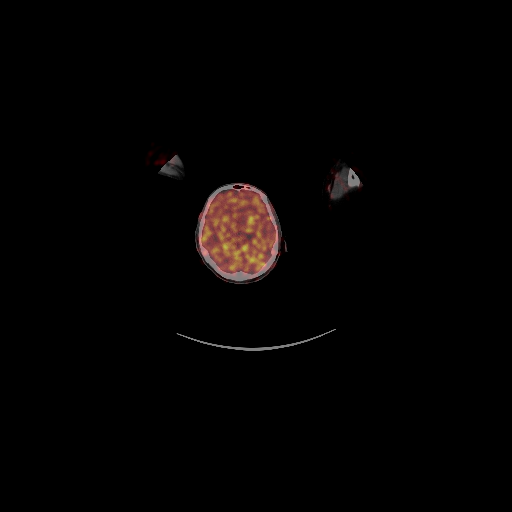
[im 108/324]
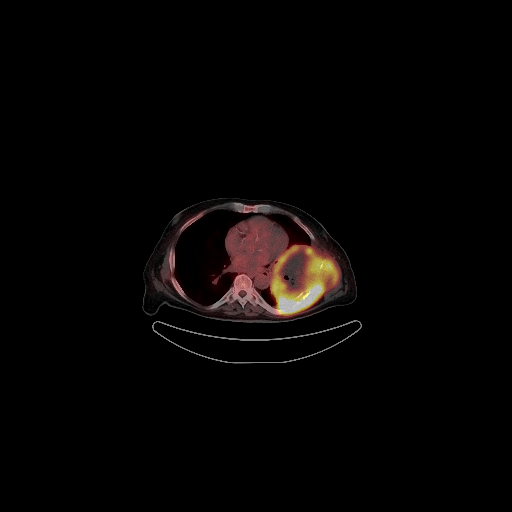
[im 216/324]
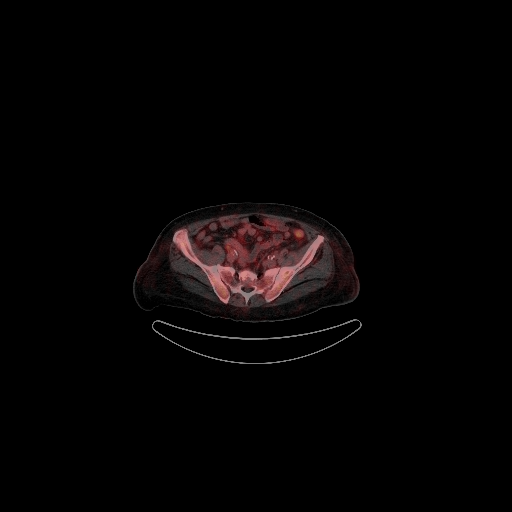
[im 324/324]
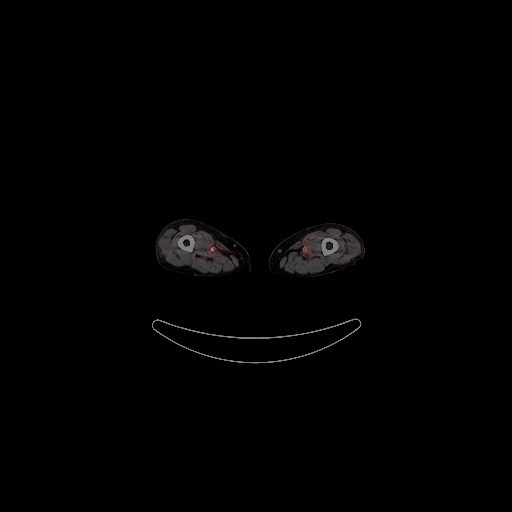

[Series 604: pet_ct coronal fused · 1 of 97 slices shown]
[im 1/97]
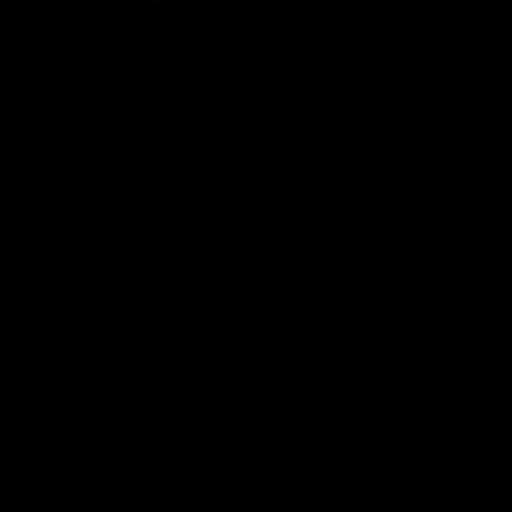

[Series 605: pet_ct sagittal fused · 2 of 148 slices shown]
[im 1/148]
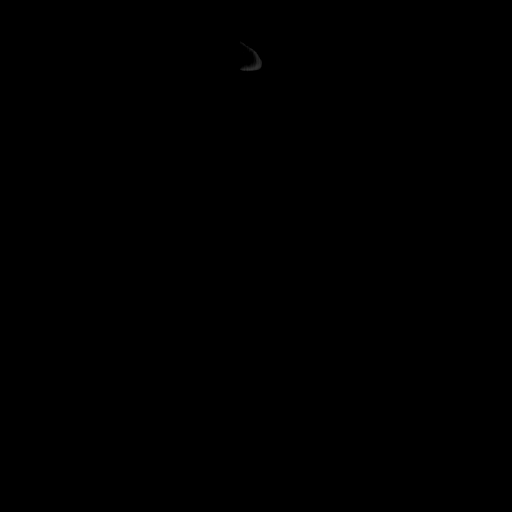
[im 148/148]
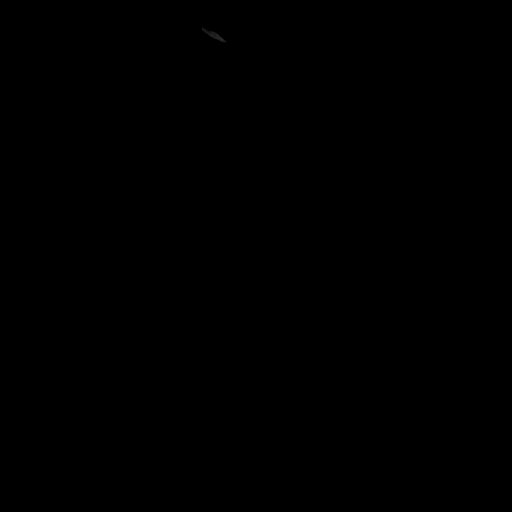

[Series 606: pet axial · 4 of 323 slices shown]
[im 1/323]
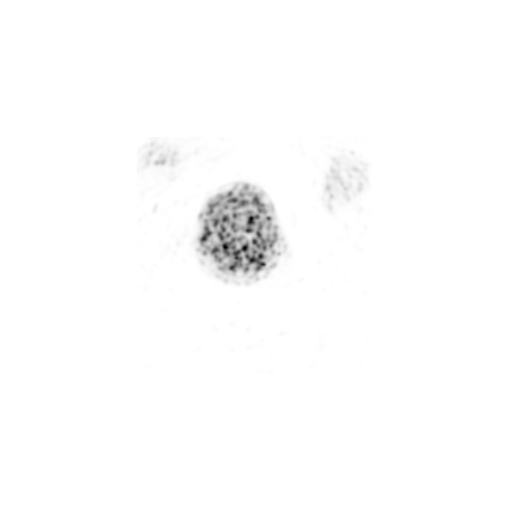
[im 108/323]
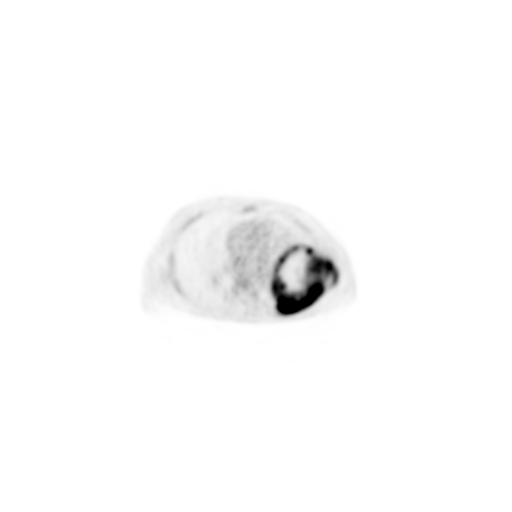
[im 215/323]
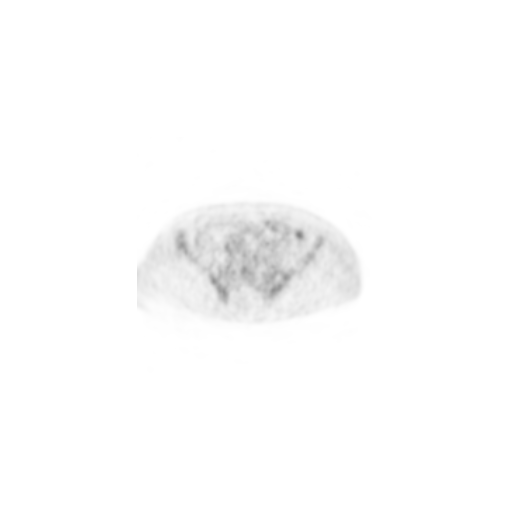
[im 323/323]
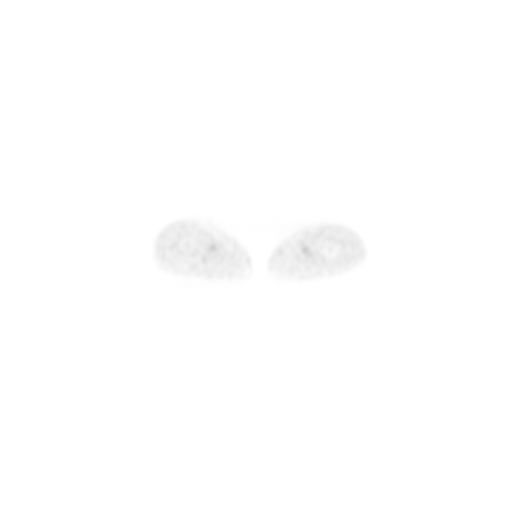

[Series 607: pet coronal · 1 of 129 slices shown]
[im 1/129]
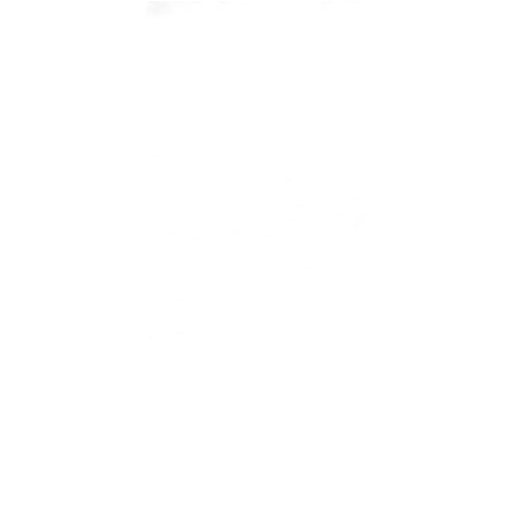

[Series 608: pet sagittal · 2 of 151 slices shown]
[im 1/151]
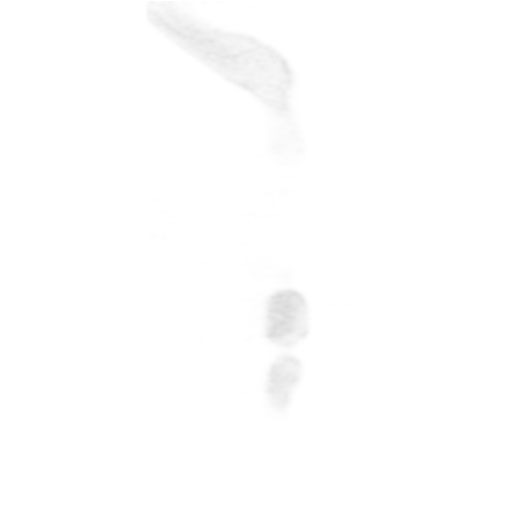
[im 151/151]
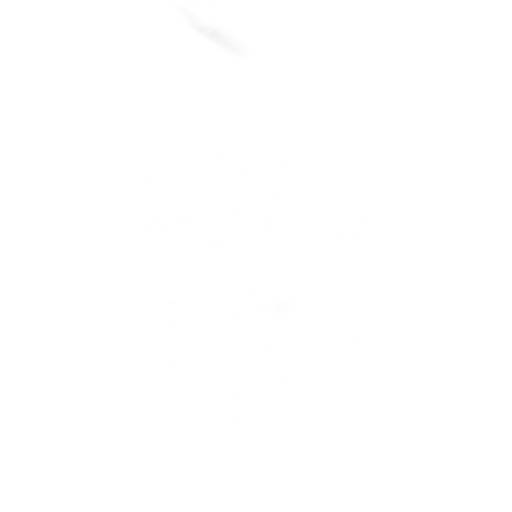

[25 of 25 positions shown; findings below may reference images not displayed]

FINDINGS: Mediastinal blood pool activity: SUV max

NECK: No hypermetabolic lymph nodes in the neck.

Incidental CT findings: Advanced bilateral carotid artery
calcifications for age.

CHEST: The large mass occupying the left lower hemithorax is
hypermetabolic and consistent with known malignancy. SUV max is
13.91. As demonstrated on the CT scan the lesion invades the chest
wall with numerous destroyed left ribs and extension of the tumor
into the chest wall musculature including the intercostal muscles
and the serrated anterior.

Single pleural based nodule measuring 9 mm in the left upper lobe is
hypermetabolic with SUV max of 3.51. This is likely a pleural
metastasis.

No right-sided pulmonary nodules to suggest metastatic disease. No
enlarged or hypermetabolic mediastinal or hilar lymph nodes.

Incidental CT findings: Age advanced aortic and coronary artery
calcifications.

ABDOMEN/PELVIS: No hypermetabolic lesions are identified in the
liver or adrenal glands to suggest metastatic disease. The pancreas,
spleen and kidneys are unremarkable.

No enlarged or hypermetabolic abdominal/pelvic lymph nodes to
suggest metastatic disease.

Incidental CT findings: Advanced atherosclerotic calcifications
involving the aorta and branch vessels.

SKELETON: Other than the directly invaded left ribs I do not C
osseous metastatic lesions or other areas of hypermetabolism.

Incidental CT findings: none
IMPRESSION: 1. Large centrally necrotic left lower lobe lung mass invading the
chest wall and destroying the ribs is hypermetabolic and consistent
with known neoplasm.
2. No enlarged or hypermetabolic mediastinal or hilar lymph nodes.
3. Single 9 mm pleural nodule in the left upper lobe is
hypermetabolic and likely a pleural metastasis.
4. No findings for metastatic disease involving the abdomen/pelvis
or osseous structures (other than the directly invaded ribs and left
chest wall muscles).

## 2020-02-18 IMAGING — DX DG CHEST 1V PORT
1 series · 1 of 1 positions shown · non-contrast
Comparison: 07/17/2018

CLINICAL DATA: Short of breath.

EXAM:
PORTABLE CHEST 1 VIEW

[chest ap]
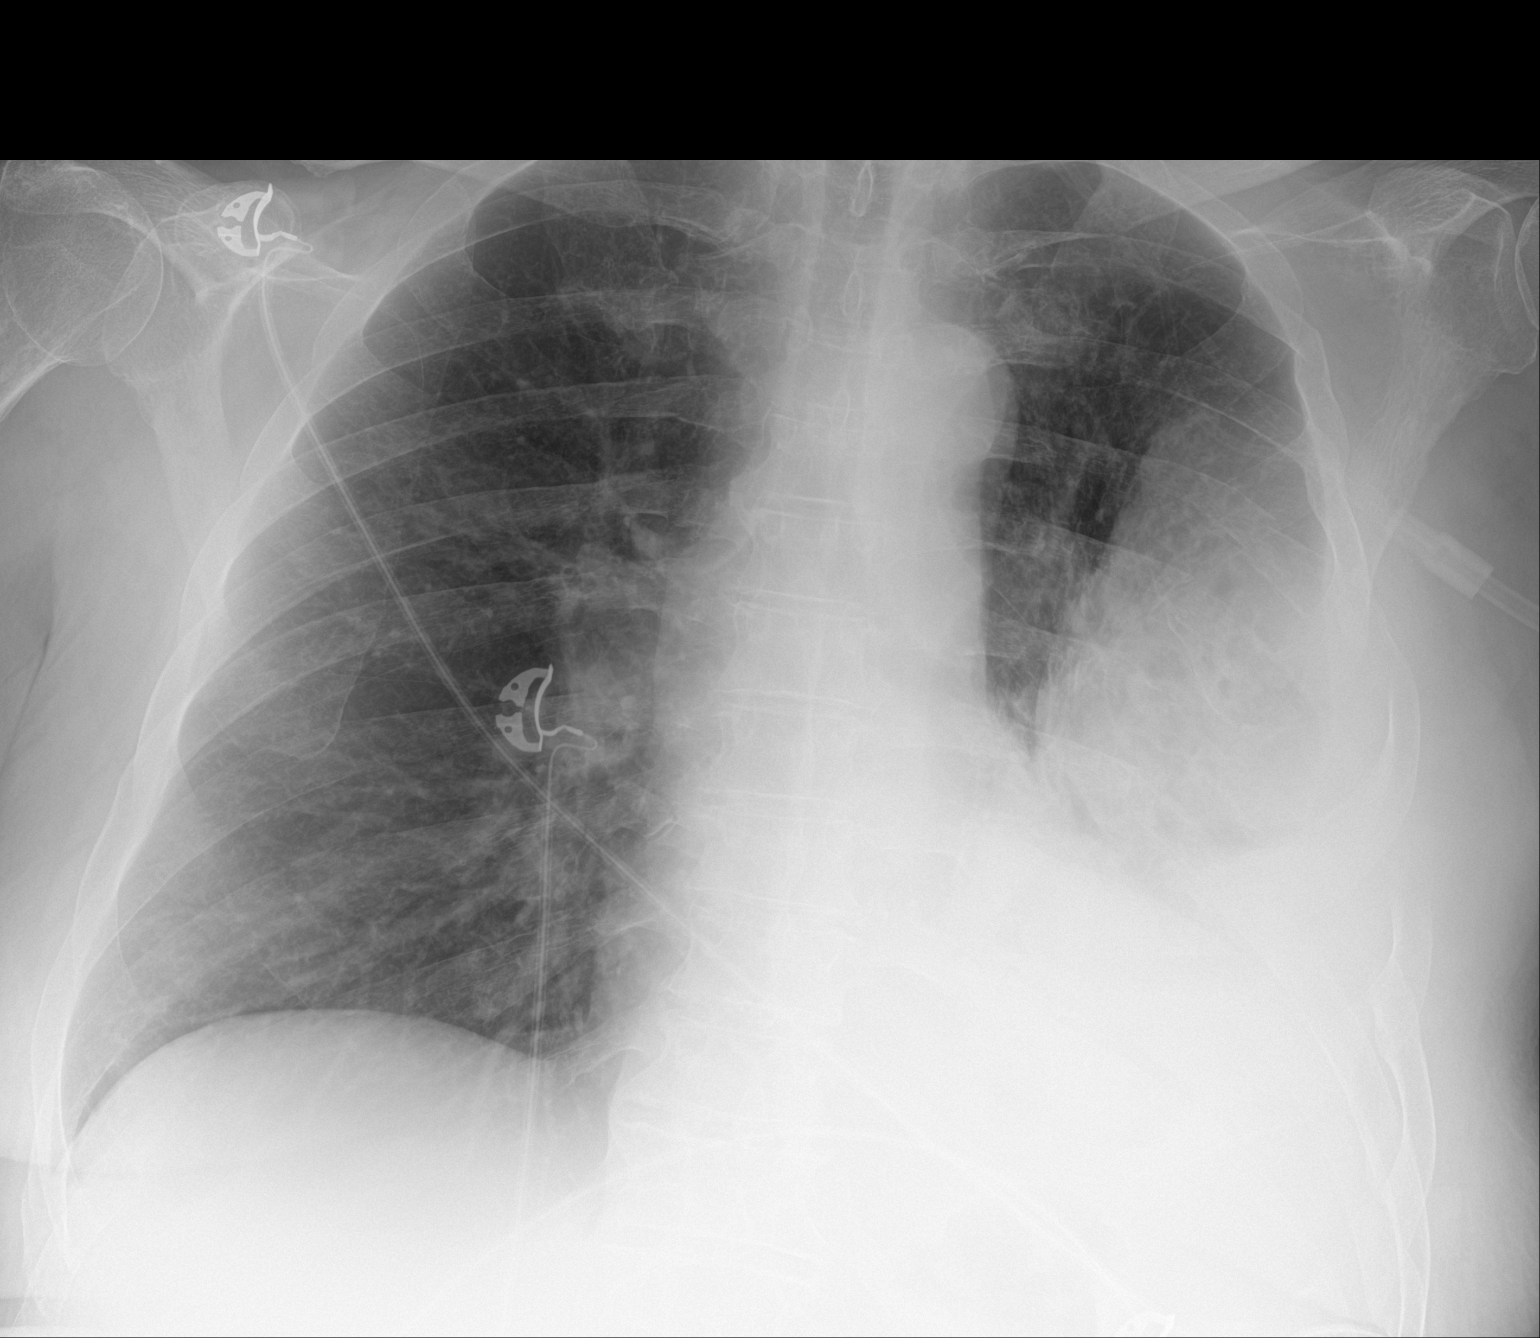

[1 of 1 positions shown; findings below may reference images not displayed]

FINDINGS: The heart size and mediastinal contours are within normal limits.
Large left lower lobe lung mass with chest wall involvement is again
noted and appears similar to previous exam. There is a left pleural
effusion which appears increased in volume from previous study.
Right lung appears clear. The visualized skeletal structures are
unremarkable.
IMPRESSION: 1. Interval increase in volume of left pleural effusion.
2. Similar appearance of large left lower lobe lung mass with chest
wall involvement.
